# Patient Record
Sex: Female | Born: 1965
Health system: Southern US, Community
[De-identification: ages and names within clinical notes are randomized; demographics above are authoritative.]

## PROBLEM LIST (undated history)

## (undated) DIAGNOSIS — I639 Cerebral infarction, unspecified: Secondary | ICD-10-CM

## (undated) DIAGNOSIS — F32A Depression, unspecified: Secondary | ICD-10-CM

## (undated) DIAGNOSIS — F319 Bipolar disorder, unspecified: Secondary | ICD-10-CM

## (undated) DIAGNOSIS — J449 Chronic obstructive pulmonary disease, unspecified: Secondary | ICD-10-CM

## (undated) DIAGNOSIS — F329 Major depressive disorder, single episode, unspecified: Secondary | ICD-10-CM

## (undated) DIAGNOSIS — J45909 Unspecified asthma, uncomplicated: Secondary | ICD-10-CM

## (undated) HISTORY — PX: APPENDECTOMY: SHX54

## (undated) HISTORY — PX: CHOLECYSTECTOMY: SHX55

## (undated) HISTORY — PX: ABDOMINAL HYSTERECTOMY: SHX81

## (undated) HISTORY — PX: LUNG LOBECTOMY: SHX167

---

## 2006-12-18 DIAGNOSIS — F4481 Dissociative identity disorder: Secondary | ICD-10-CM | POA: Insufficient documentation

## 2010-09-13 DIAGNOSIS — F319 Bipolar disorder, unspecified: Secondary | ICD-10-CM | POA: Insufficient documentation

## 2010-09-13 DIAGNOSIS — B182 Chronic viral hepatitis C: Secondary | ICD-10-CM | POA: Insufficient documentation

## 2012-02-19 DIAGNOSIS — M25551 Pain in right hip: Secondary | ICD-10-CM | POA: Insufficient documentation

## 2013-05-19 ENCOUNTER — Encounter (HOSPITAL_COMMUNITY): Payer: Self-pay | Admitting: *Deleted

## 2013-05-19 ENCOUNTER — Emergency Department (HOSPITAL_COMMUNITY)
Admission: EM | Admit: 2013-05-19 | Discharge: 2013-05-19 | Discharge status: Against Medical Advice | Payer: Medicare Other | Attending: Emergency Medicine | Admitting: Emergency Medicine

## 2013-05-19 ENCOUNTER — Emergency Department (HOSPITAL_COMMUNITY): Payer: Medicare Other

## 2013-05-19 DIAGNOSIS — R0602 Shortness of breath: Secondary | ICD-10-CM | POA: Insufficient documentation

## 2013-05-19 DIAGNOSIS — Z5320 Procedure and treatment not carried out because of patient's decision for unspecified reasons: Secondary | ICD-10-CM

## 2013-05-19 DIAGNOSIS — Z9119 Patient's noncompliance with other medical treatment and regimen: Secondary | ICD-10-CM

## 2013-05-19 DIAGNOSIS — R509 Fever, unspecified: Secondary | ICD-10-CM | POA: Insufficient documentation

## 2013-05-19 HISTORY — DX: Depression, unspecified: F32.A

## 2013-05-19 HISTORY — DX: Bipolar disorder, unspecified: F31.9

## 2013-05-19 HISTORY — DX: Major depressive disorder, single episode, unspecified: F32.9

## 2013-05-19 HISTORY — DX: Cerebral infarction, unspecified: I63.9

## 2013-05-19 HISTORY — DX: Unspecified asthma, uncomplicated: J45.909

## 2013-05-19 HISTORY — DX: Chronic obstructive pulmonary disease, unspecified: J44.9

## 2013-05-19 NOTE — ED Provider Notes (Signed)
Pt left ED prior to being seen by physician.   1. Left before treatment completed      Shanna Cisco, MD 05/19/13 2242

## 2013-05-19 NOTE — ED Notes (Signed)
Called for pt for second time with no response

## 2013-05-19 NOTE — ED Notes (Signed)
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

## 2013-05-19 NOTE — ED Notes (Signed)
Called for pt, pt not in waiting room.

## 2013-05-19 NOTE — ED Notes (Signed)
Pt not in waiting room

## 2013-09-08 DIAGNOSIS — M25572 Pain in left ankle and joints of left foot: Secondary | ICD-10-CM | POA: Insufficient documentation

## 2014-12-27 ENCOUNTER — Other Ambulatory Visit (HOSPITAL_COMMUNITY): Payer: Self-pay | Admitting: Physician Assistant

## 2014-12-27 DIAGNOSIS — Z1231 Encounter for screening mammogram for malignant neoplasm of breast: Secondary | ICD-10-CM

## 2015-01-05 ENCOUNTER — Ambulatory Visit (HOSPITAL_COMMUNITY)
Admission: RE | Admit: 2015-01-05 | Discharge: 2015-01-05 | Disposition: A | Discharge status: Home / Self Care | Payer: Medicare Other | Source: Ambulatory Visit | Attending: Physician Assistant | Admitting: Physician Assistant

## 2015-01-05 ENCOUNTER — Ambulatory Visit (HOSPITAL_COMMUNITY): Payer: Medicare Other

## 2015-01-05 DIAGNOSIS — Z1231 Encounter for screening mammogram for malignant neoplasm of breast: Secondary | ICD-10-CM

## 2015-06-08 ENCOUNTER — Other Ambulatory Visit (HOSPITAL_COMMUNITY): Payer: Self-pay | Admitting: Respiratory Therapy

## 2015-06-08 DIAGNOSIS — J441 Chronic obstructive pulmonary disease with (acute) exacerbation: Secondary | ICD-10-CM

## 2015-06-10 ENCOUNTER — Encounter (HOSPITAL_COMMUNITY): Payer: Medicare Other

## 2015-06-17 ENCOUNTER — Ambulatory Visit (HOSPITAL_COMMUNITY)
Admission: RE | Admit: 2015-06-17 | Discharge: 2015-06-17 | Disposition: A | Discharge status: Home / Self Care | Payer: Medicare Other | Source: Ambulatory Visit | Attending: Pulmonary Disease | Admitting: Pulmonary Disease

## 2015-06-17 DIAGNOSIS — R05 Cough: Secondary | ICD-10-CM | POA: Diagnosis not present

## 2015-06-17 DIAGNOSIS — F17210 Nicotine dependence, cigarettes, uncomplicated: Secondary | ICD-10-CM | POA: Diagnosis not present

## 2015-06-17 DIAGNOSIS — R0600 Dyspnea, unspecified: Secondary | ICD-10-CM | POA: Insufficient documentation

## 2015-06-17 MED ORDER — ALBUTEROL SULFATE (2.5 MG/3ML) 0.083% IN NEBU
2.5000 mg | INHALATION_SOLUTION | Freq: Once | RESPIRATORY_TRACT | Status: AC
  Administered 2015-06-17: 2.5 mg via RESPIRATORY_TRACT

## 2015-08-05 LAB — PULMONARY FUNCTION TEST
DL/VA % PRED: 63 %
DL/VA: 3.35 ml/min/mmHg/L
DLCO UNC % PRED: 39 %
DLCO UNC: 11.76 ml/min/mmHg
FEF 25-75 POST: 1.27 L/s
FEF 25-75 PRE: 1.05 L/s
FEF2575-%CHANGE-POST: 20 %
FEF2575-%PRED-POST: 40 %
FEF2575-%Pred-Pre: 33 %
FEV1-%CHANGE-POST: 5 %
FEV1-%Pred-Post: 56 %
FEV1-%Pred-Pre: 53 %
FEV1-POST: 1.85 L
FEV1-Pre: 1.75 L
FEV1FVC-%CHANGE-POST: 2 %
FEV1FVC-%PRED-PRE: 83 %
FEV6-%CHANGE-POST: 3 %
FEV6-%Pred-Post: 66 %
FEV6-%Pred-Pre: 64 %
FEV6-PRE: 2.58 L
FEV6-Post: 2.66 L
FEV6FVC-%CHANGE-POST: 0 %
FEV6FVC-%PRED-PRE: 103 %
FEV6FVC-%Pred-Post: 103 %
FVC-%CHANGE-POST: 3 %
FVC-%PRED-POST: 65 %
FVC-%Pred-Pre: 63 %
FVC-Post: 2.66 L
FVC-Pre: 2.58 L
POST FEV1/FVC RATIO: 69 %
Post FEV6/FVC ratio: 100 %
Pre FEV1/FVC ratio: 68 %
Pre FEV6/FVC Ratio: 100 %
RV % PRED: 93 %
RV: 1.82 L
TLC % pred: 76 %
TLC: 4.33 L

## 2015-11-02 ENCOUNTER — Other Ambulatory Visit (HOSPITAL_COMMUNITY): Payer: Self-pay | Admitting: Pulmonary Disease

## 2015-11-02 DIAGNOSIS — Z780 Asymptomatic menopausal state: Secondary | ICD-10-CM

## 2015-11-09 ENCOUNTER — Ambulatory Visit (HOSPITAL_COMMUNITY)
Admission: RE | Admit: 2015-11-09 | Discharge: 2015-11-09 | Disposition: A | Discharge status: Home / Self Care | Payer: Medicare Other | Source: Ambulatory Visit | Attending: Pulmonary Disease | Admitting: Pulmonary Disease

## 2015-11-09 DIAGNOSIS — Z780 Asymptomatic menopausal state: Secondary | ICD-10-CM | POA: Diagnosis not present

## 2015-12-05 ENCOUNTER — Other Ambulatory Visit (HOSPITAL_COMMUNITY): Payer: Self-pay | Admitting: Physician Assistant

## 2015-12-05 DIAGNOSIS — Z1231 Encounter for screening mammogram for malignant neoplasm of breast: Secondary | ICD-10-CM

## 2015-12-12 ENCOUNTER — Ambulatory Visit (HOSPITAL_COMMUNITY): Payer: Medicare Other

## 2015-12-26 ENCOUNTER — Inpatient Hospital Stay (HOSPITAL_COMMUNITY)
Admission: AD | Admit: 2015-12-26 | Discharge: 2015-12-28 | DRG: 190 | Disposition: A | Discharge status: Home / Self Care | Payer: Medicare Other | Source: Ambulatory Visit | Attending: Pulmonary Disease | Admitting: Pulmonary Disease

## 2015-12-26 ENCOUNTER — Encounter (HOSPITAL_COMMUNITY): Payer: Self-pay

## 2015-12-26 ENCOUNTER — Inpatient Hospital Stay (HOSPITAL_COMMUNITY): Payer: Medicare Other

## 2015-12-26 DIAGNOSIS — I10 Essential (primary) hypertension: Secondary | ICD-10-CM | POA: Diagnosis present

## 2015-12-26 DIAGNOSIS — F3162 Bipolar disorder, current episode mixed, moderate: Secondary | ICD-10-CM | POA: Diagnosis present

## 2015-12-26 DIAGNOSIS — F17200 Nicotine dependence, unspecified, uncomplicated: Secondary | ICD-10-CM | POA: Diagnosis present

## 2015-12-26 DIAGNOSIS — E782 Mixed hyperlipidemia: Secondary | ICD-10-CM | POA: Diagnosis present

## 2015-12-26 DIAGNOSIS — J45909 Unspecified asthma, uncomplicated: Secondary | ICD-10-CM | POA: Diagnosis present

## 2015-12-26 DIAGNOSIS — Z902 Acquired absence of lung [part of]: Secondary | ICD-10-CM

## 2015-12-26 DIAGNOSIS — Z8673 Personal history of transient ischemic attack (TIA), and cerebral infarction without residual deficits: Secondary | ICD-10-CM

## 2015-12-26 DIAGNOSIS — J9621 Acute and chronic respiratory failure with hypoxia: Secondary | ICD-10-CM | POA: Diagnosis present

## 2015-12-26 DIAGNOSIS — J441 Chronic obstructive pulmonary disease with (acute) exacerbation: Secondary | ICD-10-CM | POA: Diagnosis present

## 2015-12-26 DIAGNOSIS — I6930 Unspecified sequelae of cerebral infarction: Secondary | ICD-10-CM

## 2015-12-26 LAB — COMPREHENSIVE METABOLIC PANEL
ALK PHOS: 105 U/L (ref 38–126)
ALT: 33 U/L (ref 14–54)
AST: 38 U/L (ref 15–41)
Albumin: 3.8 g/dL (ref 3.5–5.0)
Anion gap: 6 (ref 5–15)
BILIRUBIN TOTAL: 0.5 mg/dL (ref 0.3–1.2)
BUN: 7 mg/dL (ref 6–20)
CALCIUM: 9 mg/dL (ref 8.9–10.3)
CO2: 28 mmol/L (ref 22–32)
CREATININE: 0.71 mg/dL (ref 0.44–1.00)
Chloride: 106 mmol/L (ref 101–111)
GFR calc Af Amer: >60 mL/min (ref >60)
Glucose, Bld: 158 mg/dL — ABNORMAL HIGH (ref 65–99)
Potassium: 4.4 mmol/L (ref 3.5–5.1)
Sodium: 140 mmol/L (ref 135–145)
TOTAL PROTEIN: 7.2 g/dL (ref 6.5–8.1)

## 2015-12-26 LAB — CBC WITH DIFFERENTIAL/PLATELET
BASOS ABS: 0 10*3/uL (ref 0.0–0.1)
Basophils Relative: 0 %
EOS ABS: 0.1 10*3/uL (ref 0.0–0.7)
EOS PCT: 1 %
HCT: 49.2 % — ABNORMAL HIGH (ref 36.0–46.0)
Hemoglobin: 16.6 g/dL — ABNORMAL HIGH (ref 12.0–15.0)
LYMPHS ABS: 2.5 10*3/uL (ref 0.7–4.0)
Lymphocytes Relative: 22 %
MCH: 33 pg (ref 26.0–34.0)
MCHC: 33.7 g/dL (ref 30.0–36.0)
MCV: 97.8 fL (ref 78.0–100.0)
Monocytes Absolute: 0.5 10*3/uL (ref 0.1–1.0)
Monocytes Relative: 5 %
Neutro Abs: 8.3 10*3/uL — ABNORMAL HIGH (ref 1.7–7.7)
Neutrophils Relative %: 72 %
PLATELETS: 299 10*3/uL (ref 150–400)
RBC: 5.03 MIL/uL (ref 3.87–5.11)
RDW: 13.9 % (ref 11.5–15.5)
WBC: 11.4 10*3/uL — AB (ref 4.0–10.5)

## 2015-12-26 MED ORDER — ONDANSETRON HCL 4 MG/2ML IJ SOLN: 4.0000 mg | Freq: Four times a day (QID) | INTRAMUSCULAR | Status: DC | PRN

## 2015-12-26 MED ORDER — PERPHENAZINE 8 MG PO TABS
24.0000 mg | ORAL_TABLET | Freq: Every day | ORAL | Status: DC
  Administered 2015-12-26 – 2015-12-27 (×2): 24 mg via ORAL
  Filled 2015-12-26 (×4): qty 1

## 2015-12-26 MED ORDER — SODIUM CHLORIDE 0.9 % IV SOLN
INTRAVENOUS | Status: DC
  Administered 2015-12-27: 22:00:00 via INTRAVENOUS

## 2015-12-26 MED ORDER — ACETAMINOPHEN 650 MG RE SUPP: 650.0000 mg | Freq: Four times a day (QID) | RECTAL | Status: DC | PRN

## 2015-12-26 MED ORDER — ENOXAPARIN SODIUM 40 MG/0.4ML ~~LOC~~ SOLN
40.0000 mg | SUBCUTANEOUS | Status: DC
  Administered 2015-12-26 – 2015-12-27 (×2): 40 mg via SUBCUTANEOUS
  Filled 2015-12-26 (×2): qty 0.4

## 2015-12-26 MED ORDER — METHYLPREDNISOLONE SODIUM SUCC 40 MG IJ SOLR
40.0000 mg | Freq: Two times a day (BID) | INTRAMUSCULAR | Status: DC
  Administered 2015-12-27 – 2015-12-28 (×3): 40 mg via INTRAVENOUS
  Filled 2015-12-26 (×3): qty 1

## 2015-12-26 MED ORDER — ACETAMINOPHEN 325 MG PO TABS
650.0000 mg | ORAL_TABLET | Freq: Four times a day (QID) | ORAL | Status: DC | PRN
  Administered 2015-12-28: 650 mg via ORAL
  Filled 2015-12-26: qty 2

## 2015-12-26 MED ORDER — ONDANSETRON HCL 4 MG PO TABS: 4.0000 mg | ORAL_TABLET | Freq: Four times a day (QID) | ORAL | Status: DC | PRN

## 2015-12-26 MED ORDER — IPRATROPIUM-ALBUTEROL 0.5-2.5 (3) MG/3ML IN SOLN
3.0000 mL | RESPIRATORY_TRACT | Status: DC
  Administered 2015-12-26 (×2): 3 mL via RESPIRATORY_TRACT
  Filled 2015-12-26 (×2): qty 3

## 2015-12-27 LAB — BASIC METABOLIC PANEL
Anion gap: 8 (ref 5–15)
BUN: 6 mg/dL (ref 6–20)
CHLORIDE: 109 mmol/L (ref 101–111)
CO2: 27 mmol/L (ref 22–32)
Calcium: 9 mg/dL (ref 8.9–10.3)
Creatinine, Ser: 0.6 mg/dL (ref 0.44–1.00)
GFR calc Af Amer: >60 mL/min (ref >60)
GFR calc non Af Amer: >60 mL/min (ref >60)
GLUCOSE: 86 mg/dL (ref 65–99)
POTASSIUM: 3.8 mmol/L (ref 3.5–5.1)
Sodium: 144 mmol/L (ref 135–145)

## 2015-12-27 MED ORDER — ATENOLOL 25 MG PO TABS
50.0000 mg | ORAL_TABLET | Freq: Every day | ORAL | Status: DC
  Administered 2015-12-27 – 2015-12-28 (×2): 50 mg via ORAL
  Filled 2015-12-27 (×2): qty 2

## 2015-12-27 MED ORDER — MONTELUKAST SODIUM 10 MG PO TABS
10.0000 mg | ORAL_TABLET | Freq: Every evening | ORAL | Status: DC
  Administered 2015-12-27: 10 mg via ORAL
  Filled 2015-12-27: qty 1

## 2015-12-27 MED ORDER — PERPHENAZINE 8 MG PO TABS: 24.0000 mg | ORAL_TABLET | Freq: Every day | ORAL | Status: DC

## 2015-12-27 MED ORDER — IPRATROPIUM-ALBUTEROL 0.5-2.5 (3) MG/3ML IN SOLN
3.0000 mL | Freq: Four times a day (QID) | RESPIRATORY_TRACT | Status: DC
  Administered 2015-12-27 – 2015-12-28 (×5): 3 mL via RESPIRATORY_TRACT
  Filled 2015-12-27 (×5): qty 3

## 2015-12-27 MED ORDER — MOMETASONE FURO-FORMOTEROL FUM 200-5 MCG/ACT IN AERO
2.0000 | INHALATION_SPRAY | Freq: Two times a day (BID) | RESPIRATORY_TRACT | Status: DC
  Administered 2015-12-27 – 2015-12-28 (×2): 2 via RESPIRATORY_TRACT
  Filled 2015-12-27: qty 8.8

## 2015-12-27 MED ORDER — ATORVASTATIN CALCIUM 20 MG PO TABS
20.0000 mg | ORAL_TABLET | Freq: Every day | ORAL | Status: DC
  Administered 2015-12-27: 20 mg via ORAL
  Filled 2015-12-27: qty 1

## 2015-12-27 MED ORDER — MIRTAZAPINE 30 MG PO TABS
45.0000 mg | ORAL_TABLET | Freq: Every day | ORAL | Status: DC
  Administered 2015-12-27: 45 mg via ORAL
  Filled 2015-12-27: qty 1

## 2015-12-27 MED ORDER — ASENAPINE MALEATE 10 MG SL SUBL: 20.0000 mg | SUBLINGUAL_TABLET | Freq: Every day | SUBLINGUAL | Status: DC

## 2015-12-27 MED ORDER — LAMOTRIGINE 100 MG PO TABS
300.0000 mg | ORAL_TABLET | Freq: Every day | ORAL | Status: DC
  Administered 2015-12-27: 300 mg via ORAL
  Filled 2015-12-27: qty 3

## 2015-12-27 MED ORDER — ALPRAZOLAM 1 MG PO TABS
1.0000 mg | ORAL_TABLET | Freq: Four times a day (QID) | ORAL | Status: DC
  Administered 2015-12-27 – 2015-12-28 (×5): 1 mg via ORAL
  Filled 2015-12-27 (×5): qty 1

## 2015-12-27 MED ORDER — GABAPENTIN 300 MG PO CAPS
900.0000 mg | ORAL_CAPSULE | Freq: Every day | ORAL | Status: DC
  Administered 2015-12-27: 900 mg via ORAL
  Filled 2015-12-27: qty 3

## 2015-12-27 NOTE — Progress Notes (Signed)
Subjective: She says she feels a little better. She still coughing she still congested she's coughing up a little bit of sputum  Objective: Vital signs in last 24 hours: Temp:  [98.1 F (36.7 C)-98.5 F (36.9 C)] 98.2 F (36.8 C) (03/21 0500) Pulse Rate:  [68-75] 75 (03/21 0500) Resp:  [20] 20 (03/21 0500) BP: (89-114)/(56-92) 114/92 mmHg (03/21 0500) SpO2:  [92 %-95 %] 92 % (03/21 0747) Weight:  [87.544 kg (193 lb)-87.8 kg (193 lb 9 oz)] 87.544 kg (193 lb) (03/21 0500) Weight change:  Last BM Date: 12/26/15  Intake/Output from previous day: 03/20 0701 - 03/21 0700 In: 240 [P.O.:240] Out: -   PHYSICAL EXAM General appearance: alert, cooperative and mild distress Resp: rhonchi bilaterally Cardio: regular rate and rhythm, S1, S2 normal, no murmur, click, rub or gallop GI: soft, non-tender; bowel sounds normal; no masses,  no organomegaly Extremities: extremities normal, atraumatic, no cyanosis or edema  Lab Results:  Results for orders placed or performed during the hospital encounter of 12/26/15 (from the past 48 hour(s))  Comprehensive metabolic panel     Status: Abnormal   Collection Time: 12/26/15  6:44 PM  Result Value Ref Range   Sodium 140 135 - 145 mmol/L   Potassium 4.4 3.5 - 5.1 mmol/L   Chloride 106 101 - 111 mmol/L   CO2 28 22 - 32 mmol/L   Glucose, Bld 158 (H) 65 - 99 mg/dL   BUN 7 6 - 20 mg/dL   Creatinine, Ser 0.71 0.44 - 1.00 mg/dL   Calcium 9.0 8.9 - 10.3 mg/dL   Total Protein 7.2 6.5 - 8.1 g/dL   Albumin 3.8 3.5 - 5.0 g/dL   AST 38 15 - 41 U/L   ALT 33 14 - 54 U/L   Alkaline Phosphatase 105 38 - 126 U/L   Total Bilirubin 0.5 0.3 - 1.2 mg/dL   GFR calc non Af Amer >60 >60 mL/min   GFR calc Af Amer >60 >60 mL/min    Comment: (NOTE) The eGFR has been calculated using the CKD EPI equation. This calculation has not been validated in all clinical situations. eGFR's persistently <60 mL/min signify possible Chronic Kidney Disease.    Anion gap 6 5 - 15   CBC WITH DIFFERENTIAL     Status: Abnormal   Collection Time: 12/26/15  6:44 PM  Result Value Ref Range   WBC 11.4 (H) 4.0 - 10.5 K/uL   RBC 5.03 3.87 - 5.11 MIL/uL   Hemoglobin 16.6 (H) 12.0 - 15.0 g/dL   HCT 49.2 (H) 36.0 - 46.0 %   MCV 97.8 78.0 - 100.0 fL   MCH 33.0 26.0 - 34.0 pg   MCHC 33.7 30.0 - 36.0 g/dL   RDW 13.9 11.5 - 15.5 %   Platelets 299 150 - 400 K/uL   Neutrophils Relative % 72 %   Neutro Abs 8.3 (H) 1.7 - 7.7 K/uL   Lymphocytes Relative 22 %   Lymphs Abs 2.5 0.7 - 4.0 K/uL   Monocytes Relative 5 %   Monocytes Absolute 0.5 0.1 - 1.0 K/uL   Eosinophils Relative 1 %   Eosinophils Absolute 0.1 0.0 - 0.7 K/uL   Basophils Relative 0 %   Basophils Absolute 0.0 0.0 - 0.1 K/uL  Basic metabolic panel     Status: None   Collection Time: 12/27/15  5:10 AM  Result Value Ref Range   Sodium 144 135 - 145 mmol/L   Potassium 3.8 3.5 - 5.1 mmol/L  Chloride 109 101 - 111 mmol/L   CO2 27 22 - 32 mmol/L   Glucose, Bld 86 65 - 99 mg/dL   BUN 6 6 - 20 mg/dL   Creatinine, Ser 0.60 0.44 - 1.00 mg/dL   Calcium 9.0 8.9 - 10.3 mg/dL   GFR calc non Af Amer >60 >60 mL/min   GFR calc Af Amer >60 >60 mL/min    Comment: (NOTE) The eGFR has been calculated using the CKD EPI equation. This calculation has not been validated in all clinical situations. eGFR's persistently <60 mL/min signify possible Chronic Kidney Disease.    Anion gap 8 5 - 15    ABGS No results for input(s): PHART, PO2ART, TCO2, HCO3 in the last 72 hours.  Invalid input(s): PCO2 CULTURES No results found for this or any previous visit (from the past 240 hour(s)). Studies/Results: X-ray Chest Pa And Lateral  12/26/2015  CLINICAL DATA:  Productive cough for 3 months. On antibiotics for upper respiratory infection 3 months ago current smoker with history of asthma/ COPD. EXAM: CHEST  2 VIEW COMPARISON:  05/19/2013. FINDINGS: The heart size and mediastinal contours are stable. There are postsurgical changes at the  right apex with associated pleural thickening. Mild scarring is present at both lung bases, similar to the prior examination. No superimposed edema, airspace disease or significant pleural effusion. The bones appear unchanged. IMPRESSION: Stable postoperative chest with right apical and mild bibasilar scarring. No acute cardiopulmonary process. Electronically Signed   By: Richardean Sale M.D.   On: 12/26/2015 18:46    Medications:  Prior to Admission:  Prescriptions prior to admission  Medication Sig Dispense Refill Last Dose  . ALPRAZolam (XANAX) 1 MG tablet Take 1 mg by mouth 4 (four) times daily.  3 12/26/2015 at Unknown time  . atenolol (TENORMIN) 50 MG tablet Take 50 mg by mouth daily.  2 12/26/2015 at 800  . atorvastatin (LIPITOR) 20 MG tablet Take 20 mg by mouth at bedtime.  1 12/25/2015 at Unknown time  . gabapentin (NEURONTIN) 300 MG capsule Take 900 mg by mouth at bedtime.  2 12/25/2015 at Unknown time  . lamoTRIgine (LAMICTAL) 150 MG tablet Take 300 mg by mouth at bedtime.  3 12/25/2015 at Unknown time  . mirtazapine (REMERON) 45 MG tablet Take 45 mg by mouth at bedtime.  3 12/25/2015 at Unknown time  . montelukast (SINGULAIR) 10 MG tablet Take 10 mg by mouth every evening.  2 12/25/2015 at Unknown time  . perphenazine (TRILAFON) 8 MG tablet Take 24 mg by mouth at bedtime.  3 12/25/2015 at Unknown time  . predniSONE (DELTASONE) 10 MG tablet Take 5 mg by mouth daily.  5 12/26/2015 at Unknown time  . SAPHRIS 10 MG SUBL Place 20 mg under the tongue at bedtime.  3 12/25/2015 at Unknown time  . SYMBICORT 160-4.5 MCG/ACT inhaler Inhale 2 puffs into the lungs 2 (two) times daily.  2 12/26/2015 at Unknown time  . PROAIR HFA 108 (90 Base) MCG/ACT inhaler Inhale 2 puffs into the lungs every 4 (four) hours as needed. Shortness of breath  2 unknown   Scheduled: . ALPRAZolam  1 mg Oral QID  . Asenapine Maleate  20 mg Sublingual QHS  . atenolol  50 mg Oral Daily  . atorvastatin  20 mg Oral QHS  .  enoxaparin (LOVENOX) injection  40 mg Subcutaneous Q24H  . gabapentin  900 mg Oral QHS  . ipratropium-albuterol  3 mL Nebulization Q6H  . lamoTRIgine  300 mg  Oral QHS  . methylPREDNISolone (SOLU-MEDROL) injection  40 mg Intravenous Q12H  . mirtazapine  45 mg Oral QHS  . mometasone-formoterol  2 puff Inhalation BID  . montelukast  10 mg Oral QPM  . perphenazine  24 mg Oral QHS   Continuous: . sodium chloride     NUU:[id number] **OR** acetaminophen, ondansetron **OR** ondansetron (ZOFRAN) IV  Assesment: She has COPD exacerbation and acute on chronic respiratory failure with hypoxia. She's better but not clear. She's not ready for discharge yet. Active Problems:   COPD exacerbation ([place])    Plan: Continue IV antibiotics and IV steroids and inhaled bronchodilators    LOS: 1 day   Dameian Crisman L 12/27/2015, 8:55 AM

## 2015-12-27 NOTE — H&P (Signed)
THEDORA RINGS MRN: [id number] DOB/AGE: 49-Aug-1967 49 y.o. Primary Care Physician:White, Raenette Rover, FNP Admit date: 12/26/2015 Chief Complaint: Shortness of breath cough HPI: This is documentation of history and physical done in my office yesterday prior to her admission. This is a 50 year old who has severe COPD with significant asthmatic component. She's been having cough and congestion and increased shortness of breath for about 6 weeks. She's tried multiple antibiotics and steroids as an outpatient had laboratory work and a chest x-ray that did not show any specific cause. Because of her unresponsiveness to outpatient treatment she is being admitted. She is very short of breath. She has chronic hypoxic respiratory failure on oxygen. She has had a right upper lobectomy in the past. She also has history of stroke and of bipolar disease.  Past Medical History  Diagnosis Date  . Asthma   . COPD (chronic obstructive pulmonary disease) (HCC)   . Bipolar disorder (HCC)   . Depression   . Stroke [facility])    Past Surgical History  Procedure Laterality Date  . Cesarean section    . Cholecystectomy    . Appendectomy    . Abdominal hysterectomy    . Lung lobectomy      right lung         History reviewed. No pertinent family history.  Social History:  reports that she has been smoking.  She does not have any smokeless tobacco history on file. She reports that she does not drink alcohol or use illicit drugs.   Allergies:  Allergies  Allergen Reactions  . Other Anaphylaxis    Allergic to melons  . Baclofen     hallucinations  . Flonase  [Fluticasone Propionate] Other (See Comments)    Nose swelling and nose bleeds  . Gadopentetate Dimeglumine Other (See Comments)    "purple" discoloration, slight numbness to left fifth finger, "purple dots" on the top of left hand following MRI exam 07/22/13 with Magnevist contrast.  Radiologist did not feel this was related to contrast.  . Lortab  [Hydrocodone-Acetaminophen]     N/v hives  . Seroquel [Quetiapine Fumarate]     n/v  . Sudafed [Pseudoephedrine Hcl]     blisters    Medications Prior to Admission  Medication Sig Dispense Refill  . ALPRAZolam (XANAX) 1 MG tablet Take 1 mg by mouth 4 (four) times daily.  3  . atenolol (TENORMIN) 50 MG tablet Take 50 mg by mouth daily.  2  . atorvastatin (LIPITOR) 20 MG tablet Take 20 mg by mouth at bedtime.  1  . gabapentin (NEURONTIN) 300 MG capsule Take 900 mg by mouth at bedtime.  2  . lamoTRIgine (LAMICTAL) 150 MG tablet Take 300 mg by mouth at bedtime.  3  . mirtazapine (REMERON) 45 MG tablet Take 45 mg by mouth at bedtime.  3  . montelukast (SINGULAIR) 10 MG tablet Take 10 mg by mouth every evening.  2  . perphenazine (TRILAFON) 8 MG tablet Take 24 mg by mouth at bedtime.  3  . predniSONE (DELTASONE) 10 MG tablet Take 5 mg by mouth daily.  5  . SAPHRIS 10 MG SUBL Place 20 mg under the tongue at bedtime.  3  . SYMBICORT 160-4.5 MCG/ACT inhaler Inhale 2 puffs into the lungs 2 (two) times daily.  2  . PROAIR HFA 108 (90 Base) MCG/ACT inhaler Inhale 2 puffs into the lungs every 4 (four) hours as needed. Shortness of breath  2       WUJ:WJXBJ  from the symptoms mentioned above,there are no other symptoms referable to all systems reviewed.  Physical Exam: Blood pressure 114/92, pulse 75, temperature 98.2 F (36.8 C), temperature source Oral, resp. rate 20, height 5\' 7"  (1.702 m), weight 87.544 kg (193 lb), SpO2 92 %. She is awake and alert. She is edentulous. She is in mild mild to moderate respiratory distress and coughing during the examination. Her pupils are reactive nose and throat are clear. Her neck is supple without masses. Chest shows significant bilateral wheezes. Her heart is regular without gallop. Her abdomen is soft without masses. Extremities showed no edema. Central nervous system exam is grossly intact    Recent Labs  12/26/15 1844  WBC 11.4*  NEUTROABS 8.3*   HGB 16.6*  HCT 49.2*  MCV 97.8  PLT 299    Recent Labs  12/26/15 1844 12/27/15 0510  NA 140 144  K 4.4 3.8  CL 106 109  CO2 28 27  GLUCOSE 158* 86  BUN 7 6  CREATININE 0.71 0.60  CALCIUM 9.0 9.0  lablast2(ast:2,ALT:2,alkphos:2,bilitot:2,prot:2,albumin:2)@    No results found for this or any previous visit (from the past 240 hour(s)).   X-ray Chest Pa And Lateral  12/26/2015  CLINICAL DATA:  Productive cough for 3 months. On antibiotics for upper respiratory infection 3 months ago current smoker with history of asthma/ COPD. EXAM: CHEST  2 VIEW COMPARISON:  05/19/2013. FINDINGS: The heart size and mediastinal contours are stable. There are postsurgical changes at the right apex with associated pleural thickening. Mild scarring is present at both lung bases, similar to the prior examination. No superimposed edema, airspace disease or significant pleural effusion. The bones appear unchanged. IMPRESSION: Stable postoperative chest with right apical and mild bibasilar scarring. No acute cardiopulmonary process. Electronically Signed   By: Carey BullocksWilliam  Veazey M.D.   On: 12/26/2015 18:46   Impression: She has COPD exacerbation. She has a history of right upper lobectomy. She has bipolar disease and history of a stroke. She has chronic hypoxic respiratory failure Active Problems:   COPD exacerbation (HCC)     Plan: Admit for IV antibiotics and steroids and nebulizer treatments.      Daryana Whirley L   12/27/2015, 8:53 AM

## 2015-12-28 ENCOUNTER — Ambulatory Visit (HOSPITAL_COMMUNITY): Payer: Medicare Other

## 2015-12-28 DIAGNOSIS — J9621 Acute and chronic respiratory failure with hypoxia: Secondary | ICD-10-CM | POA: Diagnosis present

## 2015-12-28 DIAGNOSIS — I6930 Unspecified sequelae of cerebral infarction: Secondary | ICD-10-CM

## 2015-12-28 DIAGNOSIS — E782 Mixed hyperlipidemia: Secondary | ICD-10-CM | POA: Diagnosis present

## 2015-12-28 DIAGNOSIS — I10 Essential (primary) hypertension: Secondary | ICD-10-CM | POA: Diagnosis present

## 2015-12-28 DIAGNOSIS — F3162 Bipolar disorder, current episode mixed, moderate: Secondary | ICD-10-CM | POA: Diagnosis present

## 2015-12-28 MED ORDER — PREDNISONE 10 MG PO TABS: 40.0000 mg | ORAL_TABLET | Freq: Every day | ORAL | Status: DC

## 2015-12-28 MED ORDER — PREDNISONE 20 MG PO TABS
40.0000 mg | ORAL_TABLET | Freq: Every day | ORAL | Status: DC
  Administered 2015-12-28: 40 mg via ORAL
  Filled 2015-12-28: qty 2

## 2015-12-28 MED ORDER — LEVOFLOXACIN 500 MG PO TABS: 500.0000 mg | ORAL_TABLET | Freq: Every day | ORAL | Status: DC

## 2015-12-28 MED ORDER — LEVOFLOXACIN 500 MG PO TABS
500.0000 mg | ORAL_TABLET | Freq: Every day | ORAL | Status: DC
  Administered 2015-12-28: 500 mg via ORAL
  Filled 2015-12-28: qty 1

## 2015-12-28 NOTE — Progress Notes (Signed)
Pt IV removed, tolerated well.  Reviewed discharge instructions with pt and answered questions at this time.   

## 2015-12-28 NOTE — Plan of Care (Signed)
Report received from night nurse. Assumed care of pt. Alicia Dixon, [place].

## 2015-12-28 NOTE — Care Management Note (Signed)
Case Management Note  Patient Details  Name: Alicia Dixon MRN: [id number] Date of Birth: [birth year]  Subjective/Objective:                  Pt admitted with COPD. Pt is from home and is ind with ADL's. Pt has no HH services or DME needs prior to admission.   Action/Plan: Pt discharging home today with self care. No CM needs.   Expected Discharge Date:       12/28/2015           Expected Discharge Plan:  Home/Self Care  In-House Referral:  NA  Discharge planning Services  CM Consult  Post Acute Care Choice:  NA Choice offered to:  NA  DME Arranged:    DME Agency:     HH Arranged:    HH Agency:     Status of Service:  Completed, signed off  Medicare Important Message Given:    Date Medicare IM Given:    Medicare IM give by:    Date Additional Medicare IM Given:    Additional Medicare Important Message give by:     If discussed at Long Length of Stay Meetings, dates discussed:    Additional Comments:  Malcolm MetroChildress, Alicia Friar Demske, RN 12/28/2015, 11:14 AM

## 2015-12-28 NOTE — Discharge Summary (Signed)
Physician Discharge Summary  Patient ID: TEARRA OUK MRN: [id number] DOB/AGE: Feb 28, 1966 50 y.o. Primary Care Physician:White, Raenette Rover, FNP Admit date: 12/26/2015 Discharge date: 12/28/2015    Discharge Diagnoses:   Active Problems:   COPD exacerbation (HCC)   Acute on chronic respiratory failure with hypoxia (HCC)   Bipolar 1 disorder, mixed, moderate (HCC)   Personal history of stroke with current residual effects   Essential hypertension   Hyperlipidemia, mixed     Medication List    TAKE these medications        ALPRAZolam 1 MG tablet  Commonly known as:  XANAX  Take 1 mg by mouth 4 (four) times daily.     atenolol 50 MG tablet  Commonly known as:  TENORMIN  Take 50 mg by mouth daily.     atorvastatin 20 MG tablet  Commonly known as:  LIPITOR  Take 20 mg by mouth at bedtime.     gabapentin 300 MG capsule  Commonly known as:  NEURONTIN  Take 900 mg by mouth at bedtime.     lamoTRIgine 150 MG tablet  Commonly known as:  LAMICTAL  Take 300 mg by mouth at bedtime.     levofloxacin 500 MG tablet  Commonly known as:  LEVAQUIN  Take 1 tablet (500 mg total) by mouth daily.     mirtazapine 45 MG tablet  Commonly known as:  REMERON  Take 45 mg by mouth at bedtime.     montelukast 10 MG tablet  Commonly known as:  SINGULAIR  Take 10 mg by mouth every evening.     perphenazine 8 MG tablet  Commonly known as:  TRILAFON  Take 24 mg by mouth at bedtime.     predniSONE 10 MG tablet  Commonly known as:  DELTASONE  Take 5 mg by mouth daily.     predniSONE 10 MG tablet  Commonly known as:  DELTASONE  Take 4 tablets (40 mg total) by mouth daily with breakfast. 4 daily for 3 days, 3 for 3 days, 2 for 3 days, 1 for 3 days then 5 mg daily     PROAIR HFA 108 (90 Base) MCG/ACT inhaler  Generic drug:  albuterol  Inhale 2 puffs into the lungs every 4 (four) hours as needed. Shortness of breath     SAPHRIS 10 MG Subl  Generic drug:  Asenapine Maleate  Place 20  mg under the tongue at bedtime.     SYMBICORT 160-4.5 MCG/ACT inhaler  Generic drug:  budesonide-formoterol  Inhale 2 puffs into the lungs 2 (two) times daily.        Discharged Condition: This is a 50 year old who has been sick for several weeks. She has been on steroids and antibiotics with not much help. She came to my office on the day of admission was found to have some respiratory distress and was admitted from my office. She was started on inhaled bronchodilators and IV steroids and antibiotics and improved rapidly. At the time of discharge she said she felt the best she had been in months.    Consults:none  Significant Diagnostic Studies: X-ray Chest Pa And Lateral  12/26/2015  CLINICAL DATA:  Productive cough for 3 months. On antibiotics for upper respiratory infection 3 months ago current smoker with history of asthma/ COPD. EXAM: CHEST  2 VIEW COMPARISON:  05/19/2013. FINDINGS: The heart size and mediastinal contours are stable. There are postsurgical changes at the right apex with associated pleural thickening. Mild scarring is present at both  lung bases, similar to the prior examination. No superimposed edema, airspace disease or significant pleural effusion. The bones appear unchanged. IMPRESSION: Stable postoperative chest with right apical and mild bibasilar scarring. No acute cardiopulmonary process. Electronically Signed   By: Carey BullocksWilliam  Veazey M.D.   On: 12/26/2015 18:46    Lab Results: Basic Metabolic Panel:  Recent Labs  09/81/1903/20/17 1844 12/27/15 0510  NA 140 144  K 4.4 3.8  CL 106 109  CO2 28 27  GLUCOSE 158* 86  BUN 7 6  CREATININE 0.71 0.60  CALCIUM 9.0 9.0   Liver Function Tests:  Recent Labs  12/26/15 1844  AST 38  ALT 33  ALKPHOS 105  BILITOT 0.5  PROT 7.2  ALBUMIN 3.8     CBC:  Recent Labs  12/26/15 1844  WBC 11.4*  NEUTROABS 8.3*  HGB 16.6*  HCT 49.2*  MCV 97.8  PLT 299    No results found for this or any previous visit (from the  past 240 hour(s)).   Hospital Course: She was admitted from my office as above. Her exam shows that she was wheezing and clearly short of breath. She was wearing oxygen. She was treated with IV steroids given antibiotics and inhaled bronchodilators and continued on oxygen. She had marked improvement rapidly. By the time of discharge her shortness of breath had resolved and she felt the best she felt in months.  Discharge Exam: Blood pressure 115/80, pulse 78, temperature 97.9 F (36.6 C), temperature source Oral, resp. rate 20, height 5\' 7"  (1.702 m), weight 87.7 kg (193 lb 5.5 oz), SpO2 91 %. She is awake and alert. Her chest shows some rhonchi and decreased breath sounds which are chronic  Disposition: Home we discussed home health services but she does not want that. In addition to the medications listed on her meds above she does have a nebulizer and has been taking DuoNeb 4 times a day. She will follow-up with her primary care physician's office and in my office.      Signed: Sugar Vanzandt L   12/28/2015, 8:34 AM

## 2015-12-28 NOTE — Progress Notes (Signed)
Subjective: She says she feels great and wants to go home. She has no new complaints. Her breathing is significantly improved  Objective: Vital signs in last 24 hours: Temp:  [97.7 F (36.5 C)-97.9 F (36.6 C)] 97.9 F (36.6 C) (03/22 0425) Pulse Rate:  [78-90] 78 (03/22 0425) Resp:  [20] 20 (03/22 0425) BP: (101-122)/(57-80) 115/80 mmHg (03/22 0425) SpO2:  [89 %-97 %] 91 % (03/22 0753) Weight:  [87.7 kg (193 lb 5.5 oz)] 87.7 kg (193 lb 5.5 oz) (03/22 0425) Weight change: -0.1 kg (-3.5 oz) Last BM Date: 12/26/15  Intake/Output from previous day: 03/21 0701 - 03/22 0700 In: 1320 [P.O.:720; I.V.:600] Out: 2700 [Urine:2700]  PHYSICAL EXAM General appearance: alert, cooperative and no distress Resp: rhonchi bilaterally Cardio: regular rate and rhythm, S1, S2 normal, no murmur, click, rub or gallop GI: soft, non-tender; bowel sounds normal; no masses,  no organomegaly Extremities: extremities normal, atraumatic, no cyanosis or edema  Lab Results:  Results for orders placed or performed during the hospital encounter of 12/26/15 (from the past 48 hour(s))  Comprehensive metabolic panel     Status: Abnormal   Collection Time: 12/26/15  6:44 PM  Result Value Ref Range   Sodium 140 135 - 145 mmol/L   Potassium 4.4 3.5 - 5.1 mmol/L   Chloride 106 101 - 111 mmol/L   CO2 28 22 - 32 mmol/L   Glucose, Bld 158 (H) 65 - 99 mg/dL   BUN 7 6 - 20 mg/dL   Creatinine, Ser 0.71 0.44 - 1.00 mg/dL   Calcium 9.0 8.9 - 10.3 mg/dL   Total Protein 7.2 6.5 - 8.1 g/dL   Albumin 3.8 3.5 - 5.0 g/dL   AST 38 15 - 41 U/L   ALT 33 14 - 54 U/L   Alkaline Phosphatase 105 38 - 126 U/L   Total Bilirubin 0.5 0.3 - 1.2 mg/dL   GFR calc non Af Amer >60 >60 mL/min   GFR calc Af Amer >60 >60 mL/min    Comment: (NOTE) The eGFR has been calculated using the CKD EPI equation. This calculation has not been validated in all clinical situations. eGFR's persistently <60 mL/min signify possible Chronic  Kidney Disease.    Anion gap 6 5 - 15  CBC WITH DIFFERENTIAL     Status: Abnormal   Collection Time: 12/26/15  6:44 PM  Result Value Ref Range   WBC 11.4 (H) 4.0 - 10.5 K/uL   RBC 5.03 3.87 - 5.11 MIL/uL   Hemoglobin 16.6 (H) 12.0 - 15.0 g/dL   HCT 49.2 (H) 36.0 - 46.0 %   MCV 97.8 78.0 - 100.0 fL   MCH 33.0 26.0 - 34.0 pg   MCHC 33.7 30.0 - 36.0 g/dL   RDW 13.9 11.5 - 15.5 %   Platelets 299 150 - 400 K/uL   Neutrophils Relative % 72 %   Neutro Abs 8.3 (H) 1.7 - 7.7 K/uL   Lymphocytes Relative 22 %   Lymphs Abs 2.5 0.7 - 4.0 K/uL   Monocytes Relative 5 %   Monocytes Absolute 0.5 0.1 - 1.0 K/uL   Eosinophils Relative 1 %   Eosinophils Absolute 0.1 0.0 - 0.7 K/uL   Basophils Relative 0 %   Basophils Absolute 0.0 0.0 - 0.1 K/uL  Basic metabolic panel     Status: None   Collection Time: 12/27/15  5:10 AM  Result Value Ref Range   Sodium 144 135 - 145 mmol/L   Potassium 3.8 3.5 - 5.1 mmol/L  Chloride 109 101 - 111 mmol/L   CO2 27 22 - 32 mmol/L   Glucose, Bld 86 65 - 99 mg/dL   BUN 6 6 - 20 mg/dL   Creatinine, Ser 0.60 0.44 - 1.00 mg/dL   Calcium 9.0 8.9 - 10.3 mg/dL   GFR calc non Af Amer >60 >60 mL/min   GFR calc Af Amer >60 >60 mL/min    Comment: (NOTE) The eGFR has been calculated using the CKD EPI equation. This calculation has not been validated in all clinical situations. eGFR's persistently <60 mL/min signify possible Chronic Kidney Disease.    Anion gap 8 5 - 15    ABGS No results for input(s): PHART, PO2ART, TCO2, HCO3 in the last 72 hours.  Invalid input(s): PCO2 CULTURES No results found for this or any previous visit (from the past 240 hour(s)). Studies/Results: X-ray Chest Pa And Lateral  12/26/2015  CLINICAL DATA:  Productive cough for 3 months. On antibiotics for upper respiratory infection 3 months ago current smoker with history of asthma/ COPD. EXAM: CHEST  2 VIEW COMPARISON:  05/19/2013. FINDINGS: The heart size and mediastinal contours are  stable. There are postsurgical changes at the right apex with associated pleural thickening. Mild scarring is present at both lung bases, similar to the prior examination. No superimposed edema, airspace disease or significant pleural effusion. The bones appear unchanged. IMPRESSION: Stable postoperative chest with right apical and mild bibasilar scarring. No acute cardiopulmonary process. Electronically Signed   By: Richardean Sale M.D.   On: 12/26/2015 18:46    Medications:  Prior to Admission:  Prescriptions prior to admission  Medication Sig Dispense Refill Last Dose  . ALPRAZolam (XANAX) 1 MG tablet Take 1 mg by mouth 4 (four) times daily.  3 12/26/2015 at Unknown time  . atenolol (TENORMIN) 50 MG tablet Take 50 mg by mouth daily.  2 12/26/2015 at 800  . atorvastatin (LIPITOR) 20 MG tablet Take 20 mg by mouth at bedtime.  1 12/25/2015 at Unknown time  . gabapentin (NEURONTIN) 300 MG capsule Take 900 mg by mouth at bedtime.  2 12/25/2015 at Unknown time  . lamoTRIgine (LAMICTAL) 150 MG tablet Take 300 mg by mouth at bedtime.  3 12/25/2015 at Unknown time  . mirtazapine (REMERON) 45 MG tablet Take 45 mg by mouth at bedtime.  3 12/25/2015 at Unknown time  . montelukast (SINGULAIR) 10 MG tablet Take 10 mg by mouth every evening.  2 12/25/2015 at Unknown time  . perphenazine (TRILAFON) 8 MG tablet Take 24 mg by mouth at bedtime.  3 12/25/2015 at Unknown time  . predniSONE (DELTASONE) 10 MG tablet Take 5 mg by mouth daily.  5 12/26/2015 at Unknown time  . SAPHRIS 10 MG SUBL Place 20 mg under the tongue at bedtime.  3 12/25/2015 at Unknown time  . SYMBICORT 160-4.5 MCG/ACT inhaler Inhale 2 puffs into the lungs 2 (two) times daily.  2 12/26/2015 at Unknown time  . PROAIR HFA 108 (90 Base) MCG/ACT inhaler Inhale 2 puffs into the lungs every 4 (four) hours as needed. Shortness of breath  2 unknown   Scheduled: . ALPRAZolam  1 mg Oral QID  . Asenapine Maleate  20 mg Sublingual QHS  . atenolol  50 mg Oral Daily   . atorvastatin  20 mg Oral QHS  . enoxaparin (LOVENOX) injection  40 mg Subcutaneous Q24H  . gabapentin  900 mg Oral QHS  . ipratropium-albuterol  3 mL Nebulization Q6H  . lamoTRIgine  300 mg  Oral QHS  . levofloxacin  500 mg Oral Daily  . mirtazapine  45 mg Oral QHS  . mometasone-formoterol  2 puff Inhalation BID  . montelukast  10 mg Oral QPM  . perphenazine  24 mg Oral QHS  . predniSONE  40 mg Oral Q breakfast   Continuous: . sodium chloride 50 mL/hr at 12/27/15 2220   FBP:ZWCHENIDPOEUM **OR** acetaminophen, ondansetron **OR** ondansetron (ZOFRAN) IV  Assesment: She was admitted with COPD exacerbation and acute on chronic hypoxic respiratory failure. She is much better.  At baseline she has hypertension which has been well controlled, hyperlipidemia followed by her primary care physician and bipolar 1 disorder. She has a history of a stroke in the past and has mild continued issues from that Active Problems:   COPD exacerbation (HCC)   Acute on chronic respiratory failure with hypoxia (HCC)   Bipolar 1 disorder, mixed, moderate (HCC)   Personal history of stroke with current residual effects   Essential hypertension   Hyperlipidemia, mixed    Plan: Discharge home    LOS: 2 days   Alicia Dixon L 12/28/2015, 8:32 AM

## 2016-01-11 ENCOUNTER — Ambulatory Visit (HOSPITAL_COMMUNITY)
Admission: RE | Admit: 2016-01-11 | Discharge: 2016-01-11 | Disposition: A | Discharge status: Home / Self Care | Payer: Medicare Other | Source: Ambulatory Visit | Attending: Physician Assistant | Admitting: Physician Assistant

## 2016-01-11 DIAGNOSIS — Z1231 Encounter for screening mammogram for malignant neoplasm of breast: Secondary | ICD-10-CM | POA: Diagnosis not present

## 2016-04-16 ENCOUNTER — Encounter (HOSPITAL_COMMUNITY): Payer: Self-pay

## 2016-04-16 ENCOUNTER — Observation Stay (HOSPITAL_COMMUNITY): Payer: Medicare Other

## 2016-04-16 ENCOUNTER — Inpatient Hospital Stay (HOSPITAL_COMMUNITY)
Admission: AD | Admit: 2016-04-16 | Discharge: 2016-04-18 | DRG: 190 | Disposition: A | Discharge status: Home / Self Care | Payer: Medicare Other | Source: Ambulatory Visit | Attending: Pulmonary Disease | Admitting: Pulmonary Disease

## 2016-04-16 DIAGNOSIS — Z8673 Personal history of transient ischemic attack (TIA), and cerebral infarction without residual deficits: Secondary | ICD-10-CM

## 2016-04-16 DIAGNOSIS — J441 Chronic obstructive pulmonary disease with (acute) exacerbation: Principal | ICD-10-CM | POA: Diagnosis present

## 2016-04-16 DIAGNOSIS — F3162 Bipolar disorder, current episode mixed, moderate: Secondary | ICD-10-CM | POA: Diagnosis present

## 2016-04-16 DIAGNOSIS — Z87891 Personal history of nicotine dependence: Secondary | ICD-10-CM

## 2016-04-16 DIAGNOSIS — R0602 Shortness of breath: Secondary | ICD-10-CM | POA: Diagnosis present

## 2016-04-16 DIAGNOSIS — J45909 Unspecified asthma, uncomplicated: Secondary | ICD-10-CM | POA: Diagnosis present

## 2016-04-16 DIAGNOSIS — Z79891 Long term (current) use of opiate analgesic: Secondary | ICD-10-CM

## 2016-04-16 DIAGNOSIS — J9621 Acute and chronic respiratory failure with hypoxia: Secondary | ICD-10-CM | POA: Diagnosis present

## 2016-04-16 DIAGNOSIS — Z7951 Long term (current) use of inhaled steroids: Secondary | ICD-10-CM

## 2016-04-16 DIAGNOSIS — I10 Essential (primary) hypertension: Secondary | ICD-10-CM | POA: Diagnosis present

## 2016-04-16 LAB — CBC WITH DIFFERENTIAL/PLATELET
BASOS ABS: 0 10*3/uL (ref 0.0–0.1)
Basophils Relative: 0 %
EOS PCT: 1 %
Eosinophils Absolute: 0.1 10*3/uL (ref 0.0–0.7)
HCT: 48.1 % — ABNORMAL HIGH (ref 36.0–46.0)
Hemoglobin: 16.1 g/dL — ABNORMAL HIGH (ref 12.0–15.0)
LYMPHS PCT: 21 %
Lymphs Abs: 2.4 10*3/uL (ref 0.7–4.0)
MCH: 32.1 pg (ref 26.0–34.0)
MCHC: 33.5 g/dL (ref 30.0–36.0)
MCV: 95.8 fL (ref 78.0–100.0)
Monocytes Absolute: 0.6 10*3/uL (ref 0.1–1.0)
Monocytes Relative: 5 %
NEUTROS ABS: 8.1 10*3/uL — AB (ref 1.7–7.7)
Neutrophils Relative %: 73 %
PLATELETS: 298 10*3/uL (ref 150–400)
RBC: 5.02 MIL/uL (ref 3.87–5.11)
RDW: 13.4 % (ref 11.5–15.5)
WBC: 11.1 10*3/uL — AB (ref 4.0–10.5)

## 2016-04-16 LAB — COMPREHENSIVE METABOLIC PANEL
ALT: 25 U/L (ref 14–54)
ANION GAP: 6 (ref 5–15)
AST: 25 U/L (ref 15–41)
Albumin: 3.5 g/dL (ref 3.5–5.0)
Alkaline Phosphatase: 108 U/L (ref 38–126)
BILIRUBIN TOTAL: 0.4 mg/dL (ref 0.3–1.2)
BUN: 7 mg/dL (ref 6–20)
CO2: 26 mmol/L (ref 22–32)
Calcium: 8.8 mg/dL — ABNORMAL LOW (ref 8.9–10.3)
Chloride: 107 mmol/L (ref 101–111)
Creatinine, Ser: 0.64 mg/dL (ref 0.44–1.00)
GFR calc Af Amer: >60 mL/min (ref >60)
Glucose, Bld: 144 mg/dL — ABNORMAL HIGH (ref 65–99)
POTASSIUM: 3.6 mmol/L (ref 3.5–5.1)
Sodium: 139 mmol/L (ref 135–145)
TOTAL PROTEIN: 6.7 g/dL (ref 6.5–8.1)

## 2016-04-16 LAB — BRAIN NATRIURETIC PEPTIDE: B Natriuretic Peptide: 42 pg/mL (ref 0.0–100.0)

## 2016-04-16 MED ORDER — LEVOFLOXACIN IN D5W 500 MG/100ML IV SOLN
500.0000 mg | INTRAVENOUS | Status: DC
  Administered 2016-04-16 – 2016-04-17 (×2): 500 mg via INTRAVENOUS
  Filled 2016-04-16 (×2): qty 100

## 2016-04-16 MED ORDER — ONDANSETRON HCL 4 MG/2ML IJ SOLN: 4.0000 mg | Freq: Four times a day (QID) | INTRAMUSCULAR | Status: DC | PRN

## 2016-04-16 MED ORDER — IOPAMIDOL (ISOVUE-370) INJECTION 76%
100.0000 mL | Freq: Once | INTRAVENOUS | Status: AC | PRN
  Administered 2016-04-16: 100 mL via INTRAVENOUS

## 2016-04-16 MED ORDER — ONDANSETRON HCL 4 MG PO TABS: 4.0000 mg | ORAL_TABLET | Freq: Four times a day (QID) | ORAL | Status: DC | PRN

## 2016-04-16 MED ORDER — METHYLPREDNISOLONE SODIUM SUCC 125 MG IJ SOLR
125.0000 mg | Freq: Four times a day (QID) | INTRAMUSCULAR | Status: DC
  Administered 2016-04-16 – 2016-04-17 (×3): 125 mg via INTRAVENOUS
  Filled 2016-04-16 (×3): qty 2

## 2016-04-16 MED ORDER — ACETAMINOPHEN 325 MG PO TABS: 650.0000 mg | ORAL_TABLET | Freq: Four times a day (QID) | ORAL | Status: DC | PRN

## 2016-04-16 MED ORDER — SODIUM CHLORIDE 0.9 % IV SOLN
INTRAVENOUS | Status: DC
  Administered 2016-04-16 – 2016-04-17 (×2): via INTRAVENOUS

## 2016-04-16 MED ORDER — ACETAMINOPHEN 650 MG RE SUPP: 650.0000 mg | Freq: Four times a day (QID) | RECTAL | Status: DC | PRN

## 2016-04-16 MED ORDER — ENOXAPARIN SODIUM 40 MG/0.4ML ~~LOC~~ SOLN
40.0000 mg | SUBCUTANEOUS | Status: DC
  Administered 2016-04-16 – 2016-04-17 (×2): 40 mg via SUBCUTANEOUS
  Filled 2016-04-16 (×2): qty 0.4

## 2016-04-16 MED ORDER — SODIUM CHLORIDE 0.9% FLUSH
3.0000 mL | Freq: Two times a day (BID) | INTRAVENOUS | Status: DC
  Administered 2016-04-16 – 2016-04-17 (×4): 3 mL via INTRAVENOUS

## 2016-04-16 NOTE — H&P (Signed)
NAMVincent Peyer:  Dixon Dixon               ACCOUNT NO.:  [id number]  MEDICAL RECORD NO.:  [id number]  LOCATION:  A323                          FACILITY:  [facility]  PHYSICIAN:  Avyonna Wagoner L. Juanetta GoslingHawkins, M.D.DATE OF BIRTH:  1966/04/22  DATE OF ADMISSION:  04/16/2016 DATE OF DISCHARGE:  LH                             HISTORY & PHYSICAL   CHIEF COMPLAINT:  Shortness of breath.  HISTORY:  Ms. Dixon Dixon is a 50 year old with severe COPD and chronic hypoxic respiratory failure.  She was in her usual state of poor health when she was awakened this morning at sleep with increased shortness of breath.  She has tried nebulizer treatments, etc., and nothing has helped.  She called her primary care physician, who told her to come to my office.  She did come to my office and had what appeared to be COPD exacerbation but with some question of pulmonary emboli or other problems.  She was last hospitalized in March of this year with COPD exacerbation.  She has asthma/COPD, bipolar disease, previous history of stroke.  Surgically, she has had a C-section, cholecystectomy, appendectomy, abdominal hysterectomy, a lobectomy of her right lung.  SOCIAL HISTORY:  She has about a 60 pack-year smoking history.  She does not use any alcohol and no illicit drugs.  MEDICATIONS:  Her medications have been reviewed.  REVIEW OF SYSTEMS:  She has not had any hemoptysis.  She has had mild chest pain.  No nausea, vomiting, or diarrhea.  No abdominal pain.  No HEENT symptoms.  No leg swelling.  No recent travel.  PHYSICAL EXAMINATION:  GENERAL:  A well-developed, well-nourished female, who is in mild distress.  She is wearing oxygen. HEENT:  Pupils are reactive.  Nose and throat are clear.  Mucous membranes are moist. NECK:  Supple without masses. CHEST:  Decreased breath sounds and wheezing. HEART:  Regular without gallop. ABDOMEN:  Soft.  No masses felt. EXTREMITIES:  No edema. CENTRAL NERVOUS SYSTEM:  She appears to be anxious,  but otherwise normal.  ASSESSMENT:  She has abrupt onset of shortness of breath, but she does have a history of COPD.  I think we are going to have to rule out a pulmonary embolus and continue with her other treatments.  She is going to start on steroids and antibiotics.     Eirik Schueler L. Juanetta GoslingHawkins, M.D.     ELH/MEDQ  D:  04/16/2016  T:  04/16/2016  Job:  [id number]

## 2016-04-17 DIAGNOSIS — Z79891 Long term (current) use of opiate analgesic: Secondary | ICD-10-CM | POA: Diagnosis not present

## 2016-04-17 DIAGNOSIS — Z87891 Personal history of nicotine dependence: Secondary | ICD-10-CM | POA: Diagnosis not present

## 2016-04-17 DIAGNOSIS — F3162 Bipolar disorder, current episode mixed, moderate: Secondary | ICD-10-CM | POA: Diagnosis present

## 2016-04-17 DIAGNOSIS — I10 Essential (primary) hypertension: Secondary | ICD-10-CM | POA: Diagnosis present

## 2016-04-17 DIAGNOSIS — R0602 Shortness of breath: Secondary | ICD-10-CM | POA: Diagnosis present

## 2016-04-17 DIAGNOSIS — J9621 Acute and chronic respiratory failure with hypoxia: Secondary | ICD-10-CM | POA: Diagnosis present

## 2016-04-17 DIAGNOSIS — J45909 Unspecified asthma, uncomplicated: Secondary | ICD-10-CM | POA: Diagnosis present

## 2016-04-17 DIAGNOSIS — J441 Chronic obstructive pulmonary disease with (acute) exacerbation: Secondary | ICD-10-CM | POA: Diagnosis present

## 2016-04-17 DIAGNOSIS — Z8673 Personal history of transient ischemic attack (TIA), and cerebral infarction without residual deficits: Secondary | ICD-10-CM | POA: Diagnosis not present

## 2016-04-17 DIAGNOSIS — Z7951 Long term (current) use of inhaled steroids: Secondary | ICD-10-CM | POA: Diagnosis not present

## 2016-04-17 MED ORDER — PERPHENAZINE 8 MG PO TABS
24.0000 mg | ORAL_TABLET | Freq: Every day | ORAL | Status: DC
  Administered 2016-04-17: 24 mg via ORAL
  Filled 2016-04-17 (×3): qty 1

## 2016-04-17 MED ORDER — METHYLPREDNISOLONE SODIUM SUCC 40 MG IJ SOLR
40.0000 mg | Freq: Four times a day (QID) | INTRAMUSCULAR | Status: DC
Start: 2016-04-17 — End: 2016-04-18
  Administered 2016-04-17 – 2016-04-18 (×3): 40 mg via INTRAVENOUS
  Filled 2016-04-17 (×3): qty 1

## 2016-04-17 MED ORDER — MUPIROCIN 2 % EX OINT
TOPICAL_OINTMENT | Freq: Two times a day (BID) | CUTANEOUS | Status: DC
  Administered 2016-04-17: 21:00:00 via TOPICAL
  Filled 2016-04-17: qty 22

## 2016-04-17 MED ORDER — ASENAPINE MALEATE 5 MG SL SUBL
20.0000 mg | SUBLINGUAL_TABLET | Freq: Every day | SUBLINGUAL | Status: DC
  Filled 2016-04-17 (×3): qty 4

## 2016-04-17 MED ORDER — ALBUTEROL SULFATE (2.5 MG/3ML) 0.083% IN NEBU
2.5000 mg | INHALATION_SOLUTION | RESPIRATORY_TRACT | Status: DC | PRN
  Administered 2016-04-18: 2.5 mg via RESPIRATORY_TRACT
  Filled 2016-04-17: qty 3

## 2016-04-17 MED ORDER — ATORVASTATIN CALCIUM 20 MG PO TABS
20.0000 mg | ORAL_TABLET | Freq: Every day | ORAL | Status: DC
  Administered 2016-04-17: 20 mg via ORAL
  Filled 2016-04-17: qty 1

## 2016-04-17 MED ORDER — IBUPROFEN 600 MG PO TABS: 600.0000 mg | ORAL_TABLET | Freq: Four times a day (QID) | ORAL | Status: DC | PRN

## 2016-04-17 MED ORDER — MONTELUKAST SODIUM 10 MG PO TABS: 10.0000 mg | ORAL_TABLET | Freq: Every evening | ORAL | Status: DC

## 2016-04-17 MED ORDER — OXYCODONE-ACETAMINOPHEN 5-325 MG PO TABS: 1.0000 | ORAL_TABLET | Freq: Four times a day (QID) | ORAL | Status: DC | PRN

## 2016-04-17 MED ORDER — MIRTAZAPINE 30 MG PO TABS
45.0000 mg | ORAL_TABLET | Freq: Every day | ORAL | Status: DC
  Administered 2016-04-17: 45 mg via ORAL
  Filled 2016-04-17: qty 1

## 2016-04-17 MED ORDER — IPRATROPIUM-ALBUTEROL 0.5-2.5 (3) MG/3ML IN SOLN
3.0000 mL | Freq: Four times a day (QID) | RESPIRATORY_TRACT | Status: DC
  Administered 2016-04-18: 3 mL via RESPIRATORY_TRACT
  Filled 2016-04-17: qty 3

## 2016-04-17 MED ORDER — ZOLPIDEM TARTRATE 5 MG PO TABS: 5.0000 mg | ORAL_TABLET | Freq: Every evening | ORAL | Status: DC | PRN

## 2016-04-17 MED ORDER — GABAPENTIN 300 MG PO CAPS
900.0000 mg | ORAL_CAPSULE | Freq: Every day | ORAL | Status: DC
  Administered 2016-04-17: 900 mg via ORAL
  Filled 2016-04-17: qty 3

## 2016-04-17 MED ORDER — ALPRAZOLAM 1 MG PO TABS
1.0000 mg | ORAL_TABLET | Freq: Four times a day (QID) | ORAL | Status: DC
  Administered 2016-04-17: 1 mg via ORAL
  Filled 2016-04-17 (×2): qty 1

## 2016-04-17 MED ORDER — LAMOTRIGINE 100 MG PO TABS
300.0000 mg | ORAL_TABLET | Freq: Every day | ORAL | Status: DC
Start: 2016-04-17 — End: 2016-04-18
  Administered 2016-04-17: 300 mg via ORAL
  Filled 2016-04-17: qty 3

## 2016-04-17 MED ORDER — IPRATROPIUM-ALBUTEROL 0.5-2.5 (3) MG/3ML IN SOLN
3.0000 mL | RESPIRATORY_TRACT | Status: DC
  Administered 2016-04-17 (×4): 3 mL via RESPIRATORY_TRACT
  Filled 2016-04-17 (×5): qty 3

## 2016-04-17 MED ORDER — ATENOLOL 25 MG PO TABS
25.0000 mg | ORAL_TABLET | Freq: Every day | ORAL | Status: DC
  Administered 2016-04-17: 25 mg via ORAL
  Filled 2016-04-17: qty 1

## 2016-04-17 NOTE — Progress Notes (Signed)
Wash clothes and towels given per pt request

## 2016-04-17 NOTE — Progress Notes (Signed)
Pt did not want her xanax at thist ime

## 2016-04-17 NOTE — Progress Notes (Signed)
Subjective: She feels better. CT did not show  Objective: Vital signs in last 24 hours: Temp:  [97.8 F (36.6 C)-98.5 F (36.9 C)] 98.2 F (36.8 C) (07/11 0530) Pulse Rate:  [73-93] 93 (07/11 0530) Resp:  [20-22] 20 (07/11 0530) BP: (102-109)/(65-76) 109/65 mmHg (07/11 0530) SpO2:  [91 %-93 %] 91 % (07/11 0836) Weight:  [93.94 kg (207 lb 1.6 oz)] 93.94 kg (207 lb 1.6 oz) (07/11 0530) Weight change:  Last BM Date: 04/15/16  Intake/Output from previous day: 07/10 0701 - 07/11 0700 In: -  Out: 2000 [Urine:2000]  PHYSICAL EXAM General appearance: alert, cooperative and mild distress Resp: Clear with prolonged expiratory phase and diminished breath sounds bilaterally Cardio: regular rate and rhythm, S1, S2 normal, no murmur, click, rub or gallop GI: soft, non-tender; bowel sounds normal; no masses,  no organomegaly Extremities: extremities normal, atraumatic, no cyanosis or edema  Lab Results:  Results for orders placed or performed during the hospital encounter of 04/16/16 (from the past 48 hour(s))  Comprehensive metabolic panel     Status: Abnormal   Collection Time: 04/16/16  6:38 PM  Result Value Ref Range   Sodium 139 135 - 145 mmol/L   Potassium 3.6 3.5 - 5.1 mmol/L   Chloride 107 101 - 111 mmol/L   CO2 26 22 - 32 mmol/L   Glucose, Bld 144 (H) 65 - 99 mg/dL   BUN 7 6 - 20 mg/dL   Creatinine, Ser 0.64 0.44 - 1.00 mg/dL   Calcium 8.8 (L) 8.9 - 10.3 mg/dL   Total Protein 6.7 6.5 - 8.1 g/dL   Albumin 3.5 3.5 - 5.0 g/dL   AST 25 15 - 41 U/L   ALT 25 14 - 54 U/L   Alkaline Phosphatase 108 38 - 126 U/L   Total Bilirubin 0.4 0.3 - 1.2 mg/dL   GFR calc non Af Amer >60 >60 mL/min   GFR calc Af Amer >60 >60 mL/min    Comment: (NOTE) The eGFR has been calculated using the CKD EPI equation. This calculation has not been validated in all clinical situations. eGFR's persistently <60 mL/min signify possible Chronic Kidney Disease.    Anion gap 6 5 - 15  CBC WITH  DIFFERENTIAL     Status: Abnormal   Collection Time: 04/16/16  6:38 PM  Result Value Ref Range   WBC 11.1 (H) 4.0 - 10.5 K/uL   RBC 5.02 3.87 - 5.11 MIL/uL   Hemoglobin 16.1 (H) 12.0 - 15.0 g/dL   HCT 48.1 (H) 36.0 - 46.0 %   MCV 95.8 78.0 - 100.0 fL   MCH 32.1 26.0 - 34.0 pg   MCHC 33.5 30.0 - 36.0 g/dL   RDW 13.4 11.5 - 15.5 %   Platelets 298 150 - 400 K/uL   Neutrophils Relative % 73 %   Neutro Abs 8.1 (H) 1.7 - 7.7 K/uL   Lymphocytes Relative 21 %   Lymphs Abs 2.4 0.7 - 4.0 K/uL   Monocytes Relative 5 %   Monocytes Absolute 0.6 0.1 - 1.0 K/uL   Eosinophils Relative 1 %   Eosinophils Absolute 0.1 0.0 - 0.7 K/uL   Basophils Relative 0 %   Basophils Absolute 0.0 0.0 - 0.1 K/uL  Brain natriuretic peptide     Status: None   Collection Time: 04/16/16  6:38 PM  Result Value Ref Range   B Natriuretic Peptide 42.0 0.0 - 100.0 pg/mL    ABGS No results for input(s): PHART, PO2ART, TCO2, HCO3 in the  last 72 hours.  Invalid input(s): PCO2 CULTURES No results found for this or any previous visit (from the past 240 hour(s)). Studies/Results: Ct Angio Chest Pe W Or Wo Contrast  04/16/2016  CLINICAL DATA:  50 year old female with shortness of breath. EXAM: CT ANGIOGRAPHY CHEST WITH CONTRAST TECHNIQUE: Multidetector CT imaging of the chest was performed using the standard protocol during bolus administration of intravenous contrast. Multiplanar CT image reconstructions and MIPs were obtained to evaluate the vascular anatomy. CONTRAST:  100 cc Isovue 370 COMPARISON:  Chest radiograph dated 12/26/2015 FINDINGS: Evaluation of this exam is limited due to respiratory motion artifact. There is centrilobular emphysema with right apical scarring. Left lung base linear atelectasis/scarring noted. There is no focal consolidation. No pleural effusion or pneumothorax. There is a 1.7 x 3.8 cm high attenuating area in the right apical posterior pleural which is not well evaluated but likely related to  postsurgical material, scarring or pleurodesis. This appears to have been present on the chest radiographs dated 12/26/2015 and 05/19/2013. The central airways are patent. The thoracic aorta appears unremarkable. The origins of the great vessels of the aortic arch appear patent. Set evaluation of the pulmonary arteries is very limited due to respiratory motion artifact and not visually shin of the peripheral branches. No definite large central pulmonary artery embolus identified. V/Q scan may provide better evaluation if there is high clinical concern for PE. There is no cardiomegaly or pericardial effusion. No hilar or mediastinal adenopathy noted. The esophagus is grossly unremarkable. No thyroid nodules identified. There is no axillary adenopathy. The chest wall soft tissues appear unremarkable. The osseous structures are intact. Cholecystectomy. The visualized upper abdomen is otherwise unremarkable. Review of the MIP images confirms the above findings. IMPRESSION: Very limited evaluation of the pulmonary arteries due to respiratory motion artifact. No definite central pulmonary embolus identified. Emphysema with right apical postsurgical changes and scarring. High attenuating density along the right posterior apical pleural surface likely related to postsurgical material or pleurodesis. Electronically Signed   By: Anner Crete M.D.   On: 04/16/2016 21:33    Medications:  Prior to Admission:  Prescriptions prior to admission  Medication Sig Dispense Refill Last Dose  . ALPRAZolam (XANAX) 1 MG tablet Take 1 mg by mouth 4 (four) times daily.  3 12/26/2015 at Unknown time  . atenolol (TENORMIN) 25 MG tablet Take 25 mg by mouth daily.  1   . atorvastatin (LIPITOR) 20 MG tablet Take 20 mg by mouth at bedtime.  1 12/25/2015 at Unknown time  . gabapentin (NEURONTIN) 300 MG capsule Take 900 mg by mouth at bedtime.  2 12/25/2015 at Unknown time  . ibuprofen (ADVIL,MOTRIN) 600 MG tablet Take 1 tablet by mouth  every 6 (six) hours as needed. FOR PAIN  0   . ipratropium-albuterol (DUONEB) 0.5-2.5 (3) MG/3ML SOLN Take 3 mLs by nebulization 4 (four) times daily.     Marland Kitchen lamoTRIgine (LAMICTAL) 150 MG tablet Take 300 mg by mouth at bedtime.  3 12/25/2015 at Unknown time  . levofloxacin (LEVAQUIN) 500 MG tablet Take 1 tablet (500 mg total) by mouth daily. 10 tablet 0   . mirtazapine (REMERON) 45 MG tablet Take 45 mg by mouth at bedtime.  3 12/25/2015 at Unknown time  . montelukast (SINGULAIR) 10 MG tablet Take 10 mg by mouth every evening.  2 12/25/2015 at Unknown time  . oxyCODONE-acetaminophen (PERCOCET/ROXICET) 5-325 MG tablet Take 1 tablet by mouth every 6 (six) hours as needed. FOR PAIN  0   .  perphenazine (TRILAFON) 8 MG tablet Take 24 mg by mouth at bedtime.  3 12/25/2015 at Unknown time  . predniSONE (DELTASONE) 10 MG tablet Take 5 mg by mouth daily.  5 12/26/2015 at Unknown time  . predniSONE (DELTASONE) 10 MG tablet Take 4 tablets (40 mg total) by mouth daily with breakfast. 4 daily for 3 days, 3 for 3 days, 2 for 3 days, 1 for 3 days then 5 mg daily 30 tablet 0   . PROAIR HFA 108 (90 Base) MCG/ACT inhaler Inhale 2 puffs into the lungs every 4 (four) hours as needed. Shortness of breath  2 unknown  . SAPHRIS 10 MG SUBL Place 20 mg under the tongue at bedtime.  3 12/25/2015 at Unknown time  . SYMBICORT 160-4.5 MCG/ACT inhaler Inhale 2 puffs into the lungs 2 (two) times daily.  2 12/26/2015 at Unknown time  . [DISCONTINUED] atenolol (TENORMIN) 50 MG tablet Take 50 mg by mouth daily.  2 12/26/2015 at 800   Scheduled: . ALPRAZolam  1 mg Oral QID  . asenapine  20 mg Sublingual QHS  . atenolol  25 mg Oral Daily  . atorvastatin  20 mg Oral QHS  . enoxaparin (LOVENOX) injection  40 mg Subcutaneous Q24H  . gabapentin  900 mg Oral QHS  . ipratropium-albuterol  3 mL Nebulization Q4H  . lamoTRIgine  300 mg Oral QHS  . levofloxacin (LEVAQUIN) IV  500 mg Intravenous Q24H  . methylPREDNISolone (SOLU-MEDROL) injection   40 mg Intravenous Q6H  . mirtazapine  45 mg Oral QHS  . montelukast  10 mg Oral QPM  . perphenazine  24 mg Oral QHS  . sodium chloride flush  3 mL Intravenous Q12H   Continuous: . sodium chloride 50 mL/hr at 04/16/16 1905   PRN:acetaminophen **OR** acetaminophen, ibuprofen, ondansetron **OR** ondansetron (ZOFRAN) IV, oxyCODONE-acetaminophen  Assesment: She was admitted with shortness of breath. I was concerned when I saw her in my office she might have pulmonary emboli but she does not. No evidence of pneumonia. She has improved with IV steroids antibiotics etc. There was some problem with the orders from my office so I have reordered her maintenance medications from home Active Problems:   Shortness of breath    Plan: Continue treatments. I think she can probably go home tomorrow      , L 04/17/2016, 8:53 AM   

## 2016-04-17 NOTE — Progress Notes (Signed)
Introduced self to pt. No needs at this time. Call bell with in reach. Family at bedside.

## 2016-04-17 NOTE — Progress Notes (Signed)
Patient requesting breathing treatment.  Dr. Juanetta GoslingHawkins paged and notified.  Dr. Juanetta GoslingHawkins returned call and gave order for Duoneb treatment every 4 hours.  Patient has allergy to pseudoephedrine, and Dr. Juanetta GoslingHawkins notified.  Gave order to continue with order.

## 2016-04-17 NOTE — Care Management Obs Status (Signed)
MEDICARE OBSERVATION STATUS NOTIFICATION   Patient Details  Name: Alicia Dixon MRN: [id number] Date of Birth: 08/18/1966   Medicare Observation Status Notification Given:  Yes    Alicia Dixon, Alicia OilerSharley Diane, RN 04/17/2016, 12:07 PM

## 2016-04-17 NOTE — Care Management Note (Signed)
Case Management Note  Patient Details  Name: Alicia Dixon MRN: [id number] Date of Birth: May 19, 1966  Subjective/Objective:  Patient is from home with boyfriend. Ind with ADL's. She has home oxygen provided by [facility]. She has a PCP and reports no issues obtaining medications. Nursing had stated patient has had a few falls lately.  Patient states this is not related to weakness and she is "clumsy".                Action/Plan: Anticipate d/c home with self care. No CM needs.    Expected Discharge Date:                 04/18/2016 Expected Discharge Plan:  Home/Self Care  In-House Referral:  NA  Discharge planning Services  CM Consult  Post Acute Care Choice:  NA Choice offered to:  NA  DME Arranged:    DME Agency:     HH Arranged:    HH Agency:     Status of Service:  Completed, signed off  If discussed at [employer] Length of Stay Meetings, dates discussed:    Additional Comments:  Amirra Herling, Chrystine OilerSharley Diane, RN 04/17/2016, 12:04 PM

## 2016-04-18 MED ORDER — LEVOFLOXACIN 500 MG PO TABS: 500.0000 mg | ORAL_TABLET | Freq: Every day | ORAL | Status: DC

## 2016-04-18 MED ORDER — PREDNISONE 10 MG PO TABS: 40.0000 mg | ORAL_TABLET | Freq: Every day | ORAL | Status: DC

## 2016-04-18 NOTE — Care Management Important Message (Signed)
Important Message  Patient Details  Name: Alicia Dixon MRN: [id number] Date of Birth: 03-03-1966   Medicare Important Message Given:  Yes    Lauralei Clouse, Chrystine OilerSharley Diane, RN 04/18/2016, 11:19 AM

## 2016-04-18 NOTE — Discharge Summary (Signed)
Physician Discharge Summary  Patient ID: Alicia Dixon MRN: [id number] DOB/AGE: 03-06-1966 50 y.o. Primary Care Physician:White, Raenette Rover, FNP Admit date: 04/16/2016 Discharge date: 04/18/2016    Discharge Diagnoses:   Active Problems:   COPD exacerbation (HCC)   Acute on chronic respiratory failure with hypoxia (HCC)   Bipolar 1 disorder, mixed, moderate (HCC)   Essential hypertension   Shortness of breath     Medication List    TAKE these medications        ALPRAZolam 1 MG tablet  Commonly known as:  XANAX  Take 1 mg by mouth 4 (four) times daily.     atenolol 25 MG tablet  Commonly known as:  TENORMIN  Take 25 mg by mouth daily.     atorvastatin 20 MG tablet  Commonly known as:  LIPITOR  Take 20 mg by mouth at bedtime.     cetirizine 10 MG tablet  Commonly known as:  ZYRTEC  Take 10 mg by mouth daily.     gabapentin 300 MG capsule  Commonly known as:  NEURONTIN  Take 900 mg by mouth at bedtime.     ibuprofen 600 MG tablet  Commonly known as:  ADVIL,MOTRIN  Take 1 tablet by mouth every 6 (six) hours as needed. FOR PAIN     ipratropium-albuterol 0.5-2.5 (3) MG/3ML Soln  Commonly known as:  DUONEB  Take 3 mLs by nebulization every 6 (six) hours as needed (shortness of breath).     lamoTRIgine 150 MG tablet  Commonly known as:  LAMICTAL  Take 300 mg by mouth at bedtime.     levofloxacin 500 MG tablet  Commonly known as:  LEVAQUIN  Take 1 tablet (500 mg total) by mouth daily.     mirtazapine 45 MG tablet  Commonly known as:  REMERON  Take 45 mg by mouth at bedtime.     montelukast 10 MG tablet  Commonly known as:  SINGULAIR  Take 10 mg by mouth every evening.     oxyCODONE-acetaminophen 5-325 MG tablet  Commonly known as:  PERCOCET/ROXICET  Take 1 tablet by mouth every 6 (six) hours as needed. FOR PAIN     perphenazine 8 MG tablet  Commonly known as:  TRILAFON  Take 24 mg by mouth at bedtime.     predniSONE 10 MG tablet  Commonly known as:   DELTASONE  Take 5 mg by mouth daily.     predniSONE 10 MG tablet  Commonly known as:  DELTASONE  Take 4 tablets (40 mg total) by mouth daily with breakfast. 4 daily for 3 days, 3 for 3 days, 2 for 3 days, 1 for 3 days then 5 mg daily     PROAIR HFA 108 (90 Base) MCG/ACT inhaler  Generic drug:  albuterol  Inhale 2 puffs into the lungs every 4 (four) hours as needed. Shortness of breath     SAPHRIS 10 MG Subl  Generic drug:  Asenapine Maleate  Place 20 mg under the tongue every evening.     SYMBICORT 160-4.5 MCG/ACT inhaler  Generic drug:  budesonide-formoterol  Inhale 2 puffs into the lungs 2 (two) times daily.        Discharged Condition:Improved    Consults: None  Significant Diagnostic Studies: Ct Angio Chest Pe W Or Wo Contrast  04/16/2016  CLINICAL DATA:  50 year old female with shortness of breath. EXAM: CT ANGIOGRAPHY CHEST WITH CONTRAST TECHNIQUE: Multidetector CT imaging of the chest was performed using the standard protocol during bolus administration of  intravenous contrast. Multiplanar CT image reconstructions and MIPs were obtained to evaluate the vascular anatomy. CONTRAST:  100 cc Isovue 370 COMPARISON:  Chest radiograph dated 12/26/2015 FINDINGS: Evaluation of this exam is limited due to respiratory motion artifact. There is centrilobular emphysema with right apical scarring. Left lung base linear atelectasis/scarring noted. There is no focal consolidation. No pleural effusion or pneumothorax. There is a 1.7 x 3.8 cm high attenuating area in the right apical posterior pleural which is not well evaluated but likely related to postsurgical material, scarring or pleurodesis. This appears to have been present on the chest radiographs dated 12/26/2015 and 05/19/2013. The central airways are patent. The thoracic aorta appears unremarkable. The origins of the great vessels of the aortic arch appear patent. Set evaluation of the pulmonary arteries is very limited due to  respiratory motion artifact and not visually shin of the peripheral branches. No definite large central pulmonary artery embolus identified. V/Q scan may provide better evaluation if there is high clinical concern for PE. There is no cardiomegaly or pericardial effusion. No hilar or mediastinal adenopathy noted. The esophagus is grossly unremarkable. No thyroid nodules identified. There is no axillary adenopathy. The chest wall soft tissues appear unremarkable. The osseous structures are intact. Cholecystectomy. The visualized upper abdomen is otherwise unremarkable. Review of the MIP images confirms the above findings. IMPRESSION: Very limited evaluation of the pulmonary arteries due to respiratory motion artifact. No definite central pulmonary embolus identified. Emphysema with right apical postsurgical changes and scarring. High attenuating density along the right posterior apical pleural surface likely related to postsurgical material or pleurodesis. Electronically Signed   By: Elgie CollardArash  Radparvar M.D.   On: 04/16/2016 21:33    Lab Results: Basic Metabolic Panel:  Recent Labs  91/47/8206/07/24 1838  NA 139  K 3.6  CL 107  CO2 26  GLUCOSE 144*  BUN 7  CREATININE 0.64  CALCIUM 8.8*   Liver Function Tests:  Recent Labs  04/16/16 1838  AST 25  ALT 25  ALKPHOS 108  BILITOT 0.4  PROT 6.7  ALBUMIN 3.5     CBC:  Recent Labs  04/16/16 1838  WBC 11.1*  NEUTROABS 8.1*  HGB 16.1*  HCT 48.1*  MCV 95.8  PLT 298    No results found for this or any previous visit (from the past 240 hour(s)).   Hospital Course: This is a 50 year old who came to my office with sudden onset of severe shortness of breath. I was concerned that she might have had pulmonary emboli. She was clearly too short of breath to go home and she was brought into the hospital. She was started on IV steroids antibiotics and inhaled bronchodilators. She had CT of the chest that did not show definite pulmonary emboli. She  improved over the next 48 hours and was ready for discharge. She was back at baseline and perhaps even a little better than her baseline at home  Discharge Exam: Blood pressure 104/63, pulse 103, temperature 97.9 F (36.6 C), temperature source Oral, resp. rate 20, weight 93.92 kg (207 lb 0.9 oz), SpO2 91 %. She is awake and alert. Her chest is clear with end-expiratory wheezes  Disposition: Home      Discharge Instructions    Discharge patient    Complete by:  As directed              Signed: Prescilla Monger L   04/18/2016, 8:59 AM

## 2016-04-18 NOTE — Progress Notes (Signed)
She feels much better. She says she is back at baseline and in fact feels a little better than she normally does at home. She wants to go home.  Her vital signs are as recorded. She looks comfortable. Her chest is clear with end-expiratory wheezing. Her heart is regular without gallop. She has no edema.  She was admitted with COPD exacerbation. She is markedly improved. There was concern that she might have had pulmonary emboli but she did not.  She is ready for discharge. Please see discharge summary for details

## 2016-04-18 NOTE — Progress Notes (Signed)
Pt discharged home with significant other. IV removed and site intact.

## 2016-05-10 ENCOUNTER — Other Ambulatory Visit (HOSPITAL_COMMUNITY): Payer: Self-pay | Admitting: Pulmonary Disease

## 2016-05-10 DIAGNOSIS — R0600 Dyspnea, unspecified: Secondary | ICD-10-CM

## 2016-05-17 ENCOUNTER — Other Ambulatory Visit (HOSPITAL_COMMUNITY): Payer: Medicare Other

## 2016-05-18 ENCOUNTER — Ambulatory Visit: Payer: Medicare Other | Admitting: Gastroenterology

## 2016-05-25 ENCOUNTER — Encounter: Payer: Medicare Other | Attending: Physical Medicine & Rehabilitation | Admitting: Physical Medicine & Rehabilitation

## 2016-05-25 ENCOUNTER — Encounter: Payer: Self-pay | Admitting: Physical Medicine & Rehabilitation

## 2016-05-25 ENCOUNTER — Telehealth: Payer: Self-pay | Admitting: Physical Medicine & Rehabilitation

## 2016-05-25 VITALS — BP 111/79 | HR 72 | Resp 14

## 2016-05-25 DIAGNOSIS — Z716 Tobacco abuse counseling: Secondary | ICD-10-CM | POA: Diagnosis not present

## 2016-05-25 DIAGNOSIS — G479 Sleep disorder, unspecified: Secondary | ICD-10-CM

## 2016-05-25 DIAGNOSIS — Z9981 Dependence on supplemental oxygen: Secondary | ICD-10-CM | POA: Insufficient documentation

## 2016-05-25 DIAGNOSIS — Z720 Tobacco use: Secondary | ICD-10-CM | POA: Diagnosis not present

## 2016-05-25 DIAGNOSIS — M791 Myalgia: Secondary | ICD-10-CM

## 2016-05-25 DIAGNOSIS — R269 Unspecified abnormalities of gait and mobility: Secondary | ICD-10-CM | POA: Insufficient documentation

## 2016-05-25 DIAGNOSIS — J439 Emphysema, unspecified: Secondary | ICD-10-CM | POA: Diagnosis not present

## 2016-05-25 DIAGNOSIS — M609 Myositis, unspecified: Secondary | ICD-10-CM

## 2016-05-25 DIAGNOSIS — F319 Bipolar disorder, unspecified: Secondary | ICD-10-CM | POA: Insufficient documentation

## 2016-05-25 DIAGNOSIS — G8929 Other chronic pain: Secondary | ICD-10-CM

## 2016-05-25 DIAGNOSIS — I10 Essential (primary) hypertension: Secondary | ICD-10-CM | POA: Insufficient documentation

## 2016-05-25 DIAGNOSIS — M545 Low back pain, unspecified: Secondary | ICD-10-CM

## 2016-05-25 DIAGNOSIS — J449 Chronic obstructive pulmonary disease, unspecified: Secondary | ICD-10-CM | POA: Insufficient documentation

## 2016-05-25 DIAGNOSIS — IMO0001 Reserved for inherently not codable concepts without codable children: Secondary | ICD-10-CM

## 2016-05-25 DIAGNOSIS — F3170 Bipolar disorder, currently in remission, most recent episode unspecified: Secondary | ICD-10-CM

## 2016-05-25 MED ORDER — METHOCARBAMOL 500 MG PO TABS: 500.0000 mg | ORAL_TABLET | Freq: Three times a day (TID) | ORAL | 1 refills | Status: DC | PRN

## 2016-05-25 MED ORDER — MELOXICAM 15 MG PO TABS: 15.0000 mg | ORAL_TABLET | Freq: Every day | ORAL | 1 refills | Status: DC

## 2016-05-25 NOTE — Telephone Encounter (Signed)
She can try Skelaxin 800 TID, if that is not covered by her insurance either, we can try baclofen 5mg  TID.  Thanks

## 2016-05-25 NOTE — Progress Notes (Signed)
Subjective:    Patient ID: Alicia Dixon, female    DOB: 01/14/1966, 4Vallery Sa89 y.o.   MRN: [id number]  HPI  50 y/o female with pmh of COPD on supplemental O2, HTN, coagulopathy, bipolar disorderTIA who presents with b/l low back pain R>L. Started ~10 years ago, getting progressively worse.  No alleviating factors.  Prolonged postures exacerbate the pain.  It is sharp and dull, constant.  Non-radiating. Today, severity is 10/10.  Denies associated numbness, tingling weakness. She has tried IBU, percocet, tylenol without benefit. She was seeing a previous pain management injections, she had trigger point injection, which helped.  She has had falls because of knee instability.  Pain limits activities of daily living.    Pain Inventory Average Pain 10 Pain Right Now 10 My pain is sharp, dull and aching  In the last 24 hours, has pain interfered with the following? General activity 0 Relation with others 1 Enjoyment of life 2 What TIME of day is your pain at its worst? daytime, night Sleep (in general) Poor  Pain is worse with: walking, sitting and standing Pain improves with: medication Relief from Meds: 1  Mobility walk without assistance how many minutes can you walk? 5 ability to climb steps?  yes do you drive?  yes Do you have any goals in this area?  yes  Function I need assistance with the following:  household duties and shopping Do you have any goals in this area?  no  Neuro/Psych trouble walking depression anxiety  Prior Studies bone scan new visit  Physicians involved in your care Primary care Veverly FellsJenny Bachman, [facility] new visit   No family history on file. Social History   Social History  . Marital status: Divorced    Spouse name: N/A  . Number of children: N/A  . Years of education: N/A   Social History Main Topics  . Smoking status: Current Every Day Smoker  . Smokeless tobacco: Never Used  . Alcohol use No  . Drug use: No  . Sexual activity: Not Asked    Other Topics Concern  . None   Social History Narrative  . None   Past Surgical History:  Procedure Laterality Date  . ABDOMINAL HYSTERECTOMY    . APPENDECTOMY    . CESAREAN SECTION    . CHOLECYSTECTOMY    . LUNG LOBECTOMY     right lung    Past Medical History:  Diagnosis Date  . Asthma   . Bipolar disorder (HCC)   . COPD (chronic obstructive pulmonary disease) (HCC)   . Depression   . Stroke (HCC)    BP 111/79 (BP Location: Right Arm, Patient Position: Sitting, Cuff Size: Large)   Pulse 72   Resp 14   SpO2 94%   Opioid Risk Score:   Fall Risk Score:  `1  Depression screen PHQ 2/9  Depression screen PHQ 2/9 05/25/2016  Decreased Interest 2  Down, Depressed, Hopeless 0  PHQ - 2 Score 2  Altered sleeping 3  Tired, decreased energy 3  Change in appetite 1  Feeling bad or failure about yourself  2  Trouble concentrating 0  Moving slowly or fidgety/restless 1  Suicidal thoughts 0  PHQ-9 Score 12    Review of Systems  Constitutional: Positive for unexpected weight change.  HENT: Negative.   Eyes: Negative.   Respiratory: Positive for shortness of breath and wheezing.        Oxygen therapy  Gastrointestinal: Positive for constipation.  Endocrine: Negative.  Genitourinary: Negative.   Musculoskeletal: Positive for back pain, gait problem and neck pain.  Skin: Negative.   Allergic/Immunologic: Negative.   Hematological: Negative.   Psychiatric/Behavioral: Positive for dysphoric mood. The patient is nervous/anxious.   All other systems reviewed and are negative.     Objective:   Physical Exam Gen: NAD. Vital signs reviewed HENT: Normocephalic, Atraumatic Eyes: EOMI, Conj WNL Cardio: S1, S2 normal, RRR Pulm: B/l clear to auscultation.  Effort normal. +Supplemental O2. Abd: Soft, non-distended, non-tender, BS+ MSK:  Gait mild antalgic.   TTP R>L lumbar paraspinal, gluteal muscles.  No edema.   FABERs limited due to pain with ROM.   Neg fortin  finger Neuro: CN II-XII grossly intact.    Sensation intact to light touch in all LE dermatomes  Reflexes 2+ throughout  Strength  4+/5 in all LLE myotomes    4/5 in all LE myotomes (pain inhibition) Skin: Warm and Dry    Assessment & Plan:  50 y/o female with pmh of COPD on supplemental O2, HTN, coagulopathy, bipolar disorderTIA who presents with b/l low back pain R>L.   1. Mechanical R>L low back pain  Heat/Cold not effective per pt  She states she does not have access and a phobia to pools  Will order PT for ROM, stretching, and eval for TENS  Will order Mobic 15mg  daily with food  Will order Robaxin 500mg  TID PRN  Will consider bracing in future  Will consider Biowave in future  2. COPD  On supplemental O2, 2L  Will be weary with benzo/TCA/narcotic administration  3. Sleep disturbance  Due to pain  Will order Robaxin 500mg  TID PRN  4. Myalgia  Will schedule for trigger point injections  5. Tobacco abuse  Counseled  Spoke to pt regarding different options and follow up with PCP  6. Bipolar disorder  Managed by Psych - pt on Gabapentin qHS, Xanax, and lamictal  7. Abnormality of gait  Referral to PT  Will order cane to reduce fall risk

## 2016-05-25 NOTE — Telephone Encounter (Signed)
Patient called to let us know that her insurance is not paying for Robaxin.  She would like to know if there is something else he can call in for her.  Please call her at [phone].

## 2016-05-29 ENCOUNTER — Ambulatory Visit (HOSPITAL_COMMUNITY)
Admission: RE | Admit: 2016-05-29 | Discharge: 2016-05-29 | Disposition: A | Discharge status: Home / Self Care | Payer: Medicare Other | Source: Ambulatory Visit | Attending: Pulmonary Disease | Admitting: Pulmonary Disease

## 2016-05-29 DIAGNOSIS — R0600 Dyspnea, unspecified: Secondary | ICD-10-CM

## 2016-05-29 NOTE — Telephone Encounter (Signed)
Skelaxin will not be covered. I have ordered the baclofen 10 mg tablet 1/2 tablet tid # 45

## 2016-05-30 ENCOUNTER — Encounter (HOSPITAL_BASED_OUTPATIENT_CLINIC_OR_DEPARTMENT_OTHER): Payer: Medicare Other | Admitting: Physical Medicine & Rehabilitation

## 2016-05-30 ENCOUNTER — Encounter: Payer: Self-pay | Admitting: Physical Medicine & Rehabilitation

## 2016-05-30 ENCOUNTER — Telehealth: Payer: Self-pay | Admitting: Physical Medicine & Rehabilitation

## 2016-05-30 VITALS — BP 101/77 | HR 72 | Resp 16

## 2016-05-30 DIAGNOSIS — IMO0001 Reserved for inherently not codable concepts without codable children: Secondary | ICD-10-CM

## 2016-05-30 DIAGNOSIS — M545 Low back pain: Secondary | ICD-10-CM | POA: Diagnosis not present

## 2016-05-30 DIAGNOSIS — M791 Myalgia: Secondary | ICD-10-CM | POA: Diagnosis not present

## 2016-05-30 DIAGNOSIS — M609 Myositis, unspecified: Secondary | ICD-10-CM

## 2016-05-30 MED ORDER — BACLOFEN 10 MG PO TABS: 5.0000 mg | ORAL_TABLET | Freq: Three times a day (TID) | ORAL | 0 refills | Status: DC

## 2016-05-30 NOTE — Telephone Encounter (Signed)
We can order Skelaxin 800 TID.  Thanks.

## 2016-05-30 NOTE — Telephone Encounter (Signed)
Baclofen sent to pharmacy. Pt is aware.

## 2016-05-30 NOTE — Telephone Encounter (Signed)
Patient was in today for a visit and Dr. Allena KatzPatel ordered Baclofen for patient.  She is allergic to it.   Can something else be called in for patient?  Please call patient.

## 2016-05-30 NOTE — Progress Notes (Signed)
Subjective:    Patient ID: Alicia Dixon, female    DOB: 08-22-1966, 50 y.o.   MRN: [id number]  HPI 50 y/o female with pmh of COPD on supplemental O2, HTN, coagulopathy, bipolar disorderTIA who presents with b/l low back pain R>L. Started ~10 years ago, getting progressively worse.  No alleviating factors.  Prolonged postures exacerbate the pain.  It is sharp and dull, constant.  Non-radiating. Today, severity is 10/10.  Denies associated numbness, tingling weakness. She has tried IBU, percocet, tylenol without benefit. She was seeing a previous pain management injections, she had trigger point injection, which helped.  She has had falls because of knee instability.  Pain limits activities of daily living.    Last clinic visit 05/25/16.  Seen today for trigger point injctions  Pain Inventory Average Pain 10 Pain Right Now 9 My pain is constant, sharp and dull  In the last 24 hours, has pain interfered with the following? General activity 9 Relation with others 9 Enjoyment of life 9 What TIME of day is your pain at its worst? daytime, night Sleep (in general) Poor  Pain is worse with: walking, bending, sitting, standing and some activites Pain improves with: rest and therapy/exercise Relief from Meds: 9  Mobility walk without assistance how many minutes can you walk? 5 ability to climb steps?  no do you drive?  yes  Function Do you have any goals in this area?  no  Neuro/Psych trouble walking depression anxiety  Prior Studies Any changes since last visit?  no  Physicians involved in your care [facility], [facility]   History reviewed. No pertinent family history. Social History   Social History  . Marital status: Divorced    Spouse name: N/A  . Number of children: N/A  . Years of education: N/A   Social History Main Topics  . Smoking status: Current Every Day Smoker  . Smokeless tobacco: Never Used  . Alcohol use No  . Drug use: No  . Sexual  activity: Not Asked   Other Topics Concern  . None   Social History Narrative  . None   Past Surgical History:  Procedure Laterality Date  . ABDOMINAL HYSTERECTOMY    . APPENDECTOMY    . CESAREAN SECTION    . CHOLECYSTECTOMY    . LUNG LOBECTOMY     right lung    Past Medical History:  Diagnosis Date  . Asthma   . Bipolar disorder (HCC)   . COPD (chronic obstructive pulmonary disease) (HCC)   . Depression   . Stroke (HCC)    BP 101/77   Pulse 72   Resp 16   SpO2 92%   Opioid Risk Score:   Fall Risk Score:  `1  Depression screen PHQ 2/9  Depression screen PHQ 2/9 05/25/2016  Decreased Interest 2  Down, Depressed, Hopeless 0  PHQ - 2 Score 2  Altered sleeping 3  Tired, decreased energy 3  Change in appetite 1  Feeling bad or failure about yourself  2  Trouble concentrating 0  Moving slowly or fidgety/restless 1  Suicidal thoughts 0  PHQ-9 Score 12    Review of Systems  HENT: Negative.   Eyes: Negative.   Respiratory: Positive for cough, shortness of breath and wheezing.        Oxygen therapy Respiratory infections   Gastrointestinal: Positive for constipation.  Endocrine: Negative.   Genitourinary: Negative.   Musculoskeletal: Positive for gait problem.  Skin: Negative.   Allergic/Immunologic: Negative.  Hematological: Negative.   Psychiatric/Behavioral: Positive for dysphoric mood. The patient is nervous/anxious.   All other systems reviewed and are negative.     Objective:   Physical Exam Gen: NAD. Vital signs reviewed HENT: Normocephalic, Atraumatic Eyes: EOMI, Conj WNL Cardio: S1, S2 normal, RRR Pulm: B/l clear to auscultation.  Effort normal. +Supplemental O2. Abd: Soft, non-distended, non-tender, BS+ MSK:  Gait mild antalgic.   TTP R>L lumbar paraspinal, gluteal muscles.  No edema.   FABERs limited due to pain with ROM.   Neg fortin finger Neuro: CN II-XII grossly intact.    Sensation intact to light touch in all LE  dermatomes  Reflexes 2+ throughout  Strength  4+/5 in all LLE myotomes    4/5 in all LE myotomes (pain inhibition) Skin: Warm and Dry    Assessment & Plan:  50 y/o female with pmh of COPD on supplemental O2, HTN, coagulopathy, bipolar disorderTIA who presents with b/l low back pain R>L.   1. Mechanical R>L low back pain  Heat/Cold not effective per pt  She states she does not have access and a phobia to pools  Ordered PT for ROM, stretching, and eval for TENS  Ordered Mobic 15mg  daily with food  Ordered Robaxin 500mg  TID PRN  Will consider bracing in future  Will consider Biowave in future  2. COPD  On supplemental O2, 2L  Will be weary with benzo/TCA/narcotic administration  3. Sleep disturbance  Due to pain  Ordered Robaxin 500mg  TID PRN  4. Myalgia  Trigger point injections performed  5. Tobacco abuse  Counseled  Spoke to pt regarding different options and follow up with PCP  6. Bipolar disorder  Managed by Psych - pt on Gabapentin qHS, Xanax, and lamictal  7. Abnormality of gait  Referral to PT  Ordered cane to reduce fall risk   Trigger point injection procedure note: Trigger Point Injection: Written consent was obtained for the patient. Trigger points were identified of right lumbar paraspinals and gluteal muscles. The areas were cleaned with alcohol, and each of  these trigger points were injected with 1 cc of 0.5% Marcaine. Needle draw back was performed. There were no complications from the procedure, and it was well tolerated.

## 2016-06-01 MED ORDER — TIZANIDINE HCL 2 MG PO TABS: 2.0000 mg | ORAL_TABLET | Freq: Three times a day (TID) | ORAL | 0 refills | Status: DC | PRN

## 2016-06-01 NOTE — Addendum Note (Signed)
Addended by: Doreene ElandSHUMAKER, SYBIL W on: 06/01/2016 10:01 AM   Modules accepted: Orders

## 2016-06-01 NOTE — Telephone Encounter (Signed)
Skelaxin will not be covered by her insurance.  Can she try tizanidine? (if so please give dose and directions)

## 2016-06-07 ENCOUNTER — Ambulatory Visit: Payer: Medicare Other | Admitting: Physical Medicine & Rehabilitation

## 2016-06-07 ENCOUNTER — Encounter (HOSPITAL_BASED_OUTPATIENT_CLINIC_OR_DEPARTMENT_OTHER): Payer: Medicare Other | Admitting: Physical Medicine & Rehabilitation

## 2016-06-07 ENCOUNTER — Encounter: Payer: Self-pay | Admitting: Physical Medicine & Rehabilitation

## 2016-06-07 VITALS — BP 108/78 | HR 78

## 2016-06-07 DIAGNOSIS — Z5181 Encounter for therapeutic drug level monitoring: Secondary | ICD-10-CM

## 2016-06-07 DIAGNOSIS — M791 Myalgia: Secondary | ICD-10-CM | POA: Diagnosis not present

## 2016-06-07 DIAGNOSIS — M609 Myositis, unspecified: Secondary | ICD-10-CM | POA: Diagnosis not present

## 2016-06-07 DIAGNOSIS — IMO0001 Reserved for inherently not codable concepts without codable children: Secondary | ICD-10-CM

## 2016-06-07 DIAGNOSIS — M545 Low back pain: Secondary | ICD-10-CM | POA: Diagnosis not present

## 2016-06-07 DIAGNOSIS — Z79899 Other long term (current) drug therapy: Secondary | ICD-10-CM

## 2016-06-07 NOTE — Addendum Note (Signed)
Addended by: Doreene ElandSHUMAKER, SYBIL W on: 06/07/2016 03:41 PM   Modules accepted: Orders

## 2016-06-07 NOTE — Progress Notes (Signed)
Trigger point injection procedure note (second injection, first 05/30/16) : Trigger Point Injection: Written consent was obtained for the patient. Trigger points were identified of right lumbar paraspinals and gluteal muscles. The areas were cleaned with alcohol, and each of  these trigger points were injected with 1 cc of 0.5% Marcaine. Needle draw back was performed. There were no complications from the procedure, and it was well tolerated.

## 2016-06-08 ENCOUNTER — Encounter (HOSPITAL_COMMUNITY): Payer: Self-pay

## 2016-06-08 ENCOUNTER — Ambulatory Visit (HOSPITAL_COMMUNITY): Payer: Medicare Other | Attending: Pulmonary Disease

## 2016-06-14 ENCOUNTER — Encounter: Payer: Medicare Other | Admitting: Physical Medicine & Rehabilitation

## 2016-06-14 ENCOUNTER — Telehealth: Payer: Self-pay | Admitting: Gastroenterology

## 2016-06-14 ENCOUNTER — Ambulatory Visit: Payer: Medicare Other | Admitting: Gastroenterology

## 2016-06-14 ENCOUNTER — Encounter: Payer: Self-pay | Admitting: Gastroenterology

## 2016-06-14 NOTE — Telephone Encounter (Signed)
PT WAS A NO SHOW AND LETTER SENT  °

## 2016-06-17 LAB — TOXASSURE SELECT,+ANTIDEPR,UR

## 2016-06-19 NOTE — Progress Notes (Signed)
Urine drug screen for this encounter is consistent for prescribed medications.   

## 2016-06-27 ENCOUNTER — Telehealth: Payer: Self-pay | Admitting: Physical Medicine & Rehabilitation

## 2016-06-27 NOTE — Telephone Encounter (Signed)
Yes, she can take the cough syrup.  Thanks.

## 2016-06-27 NOTE — Telephone Encounter (Signed)
Patient went to doctor and they prescribed her a cough syrup Hycodan and she wanted to know if it would be okay for her to take it.

## 2016-06-28 NOTE — Telephone Encounter (Signed)
Spoke with patient and let her know that Dr. Allena KatzPatel said it was okay for her to take the cough syrup.

## 2016-07-06 ENCOUNTER — Encounter: Payer: Medicare Other | Admitting: Physical Medicine & Rehabilitation

## 2016-07-18 ENCOUNTER — Encounter: Payer: Medicare Other | Attending: Physical Medicine & Rehabilitation | Admitting: Physical Medicine & Rehabilitation

## 2016-07-18 ENCOUNTER — Encounter: Payer: Self-pay | Admitting: Physical Medicine & Rehabilitation

## 2016-07-18 VITALS — BP 91/65 | HR 79 | Temp 98.7°F

## 2016-07-18 DIAGNOSIS — M545 Low back pain: Secondary | ICD-10-CM | POA: Insufficient documentation

## 2016-07-18 DIAGNOSIS — Z9981 Dependence on supplemental oxygen: Secondary | ICD-10-CM | POA: Insufficient documentation

## 2016-07-18 DIAGNOSIS — M791 Myalgia, unspecified site: Secondary | ICD-10-CM

## 2016-07-18 DIAGNOSIS — Z720 Tobacco use: Secondary | ICD-10-CM | POA: Insufficient documentation

## 2016-07-18 DIAGNOSIS — R269 Unspecified abnormalities of gait and mobility: Secondary | ICD-10-CM | POA: Insufficient documentation

## 2016-07-18 DIAGNOSIS — I10 Essential (primary) hypertension: Secondary | ICD-10-CM | POA: Insufficient documentation

## 2016-07-18 DIAGNOSIS — J449 Chronic obstructive pulmonary disease, unspecified: Secondary | ICD-10-CM | POA: Insufficient documentation

## 2016-07-18 DIAGNOSIS — G479 Sleep disorder, unspecified: Secondary | ICD-10-CM | POA: Insufficient documentation

## 2016-07-18 DIAGNOSIS — F319 Bipolar disorder, unspecified: Secondary | ICD-10-CM | POA: Insufficient documentation

## 2016-07-18 NOTE — Progress Notes (Signed)
Trigger point injection procedure note (last injection, 06/07/16) : Pt with good benefit lasting 4 weeks Trigger Point Injection: Written consent was obtained for the patient. Trigger points were identified of right lumbar paraspinals and gluteal muscles. The areas were cleaned with alcohol, and each of these trigger points were injected with 1 cc of 0.5% Marcaine. Needle draw back was performed. There were no complications from the procedure, and it was well tolerated.

## 2016-07-27 ENCOUNTER — Other Ambulatory Visit: Payer: Self-pay | Admitting: Physical Medicine & Rehabilitation

## 2016-07-31 ENCOUNTER — Telehealth: Payer: Self-pay | Admitting: Physical Medicine & Rehabilitation

## 2016-07-31 NOTE — Telephone Encounter (Signed)
patients referring/PCP has called about records and to see if patients has been consistent with appointments.  They have requested the last three office notes - which medical records is sending to them - however they are questioning if the patient has been consistent with her appointments because she is there to see them to get pain medication.  They are not comfortable prescribing such medication because the [facility]  They are requesting records on the patient to understand her plan of care and [facility].  The PCP just wanted [facility] to be aware and make sure that all are on the same page.

## 2016-08-01 NOTE — Telephone Encounter (Signed)
Faxed note from initial visit and [facility] about [facility] plan and treatment avoiding narcotics at present time.

## 2016-08-15 ENCOUNTER — Ambulatory Visit: Payer: Medicare Other | Admitting: Nurse Practitioner

## 2016-08-17 ENCOUNTER — Encounter: Payer: Self-pay | Admitting: Physical Medicine & Rehabilitation

## 2016-08-17 ENCOUNTER — Encounter: Payer: Medicare Other | Attending: Physical Medicine & Rehabilitation | Admitting: Physical Medicine & Rehabilitation

## 2016-08-17 VITALS — BP 91/71 | HR 89

## 2016-08-17 DIAGNOSIS — M545 Low back pain: Secondary | ICD-10-CM | POA: Insufficient documentation

## 2016-08-17 DIAGNOSIS — J449 Chronic obstructive pulmonary disease, unspecified: Secondary | ICD-10-CM | POA: Diagnosis not present

## 2016-08-17 DIAGNOSIS — G479 Sleep disorder, unspecified: Secondary | ICD-10-CM | POA: Insufficient documentation

## 2016-08-17 DIAGNOSIS — M791 Myalgia, unspecified site: Secondary | ICD-10-CM

## 2016-08-17 DIAGNOSIS — Z9981 Dependence on supplemental oxygen: Secondary | ICD-10-CM | POA: Insufficient documentation

## 2016-08-17 DIAGNOSIS — Z720 Tobacco use: Secondary | ICD-10-CM | POA: Insufficient documentation

## 2016-08-17 DIAGNOSIS — I10 Essential (primary) hypertension: Secondary | ICD-10-CM | POA: Insufficient documentation

## 2016-08-17 DIAGNOSIS — F319 Bipolar disorder, unspecified: Secondary | ICD-10-CM | POA: Insufficient documentation

## 2016-08-17 DIAGNOSIS — R269 Unspecified abnormalities of gait and mobility: Secondary | ICD-10-CM | POA: Insufficient documentation

## 2016-08-17 NOTE — Progress Notes (Signed)
Trigger point injection procedure note: (last injection 06/07/16). Pt states she gets good relief.   Trigger Point Injection: Written consent was obtained for the patient. Trigger points were identified of right lumbar paraspinals and gluteal muscles. The areas were cleaned with alcohol, and each of  these trigger points were injected with 1 cc of 0.5% Marcaine. Needle draw back was performed. There were no complications from the procedure, and it was well tolerated.

## 2016-08-20 ENCOUNTER — Other Ambulatory Visit: Payer: Self-pay | Admitting: Physical Medicine & Rehabilitation

## 2016-08-20 ENCOUNTER — Other Ambulatory Visit: Payer: Self-pay | Admitting: *Deleted

## 2016-08-20 MED ORDER — BACLOFEN 10 MG PO TABS: 10.0000 mg | ORAL_TABLET | Freq: Three times a day (TID) | ORAL | 1 refills | Status: DC

## 2016-08-20 NOTE — Telephone Encounter (Signed)
Christen BameRonnie called and said Dr Allena KatzPatel was going to call in a muscle relaxer Friday  to pharmacy she thought it started with a "B".  They do not have it.  Please advise.

## 2016-08-20 NOTE — Telephone Encounter (Signed)
I ordered her 10 mg of baclofen TID today, but if she prefers that to tizanidine, we can DC the baclofen and prescribe her tizanidine.  Thanks.

## 2016-08-27 ENCOUNTER — Telehealth: Payer: Self-pay

## 2016-08-27 NOTE — Telephone Encounter (Signed)
She can have tizanidine 2mg  TID PRN until her next visit.  Thanks.

## 2016-08-27 NOTE — Telephone Encounter (Signed)
Patient called, stated "baclofen is one of her allergies, if possibly could she do flexeril?" Please advise

## 2016-08-28 ENCOUNTER — Encounter: Payer: Self-pay | Admitting: Nurse Practitioner

## 2016-08-28 ENCOUNTER — Ambulatory Visit (INDEPENDENT_AMBULATORY_CARE_PROVIDER_SITE_OTHER): Payer: Medicare Other | Admitting: Nurse Practitioner

## 2016-08-28 ENCOUNTER — Telehealth: Payer: Self-pay

## 2016-08-28 DIAGNOSIS — R768 Other specified abnormal immunological findings in serum: Secondary | ICD-10-CM | POA: Diagnosis not present

## 2016-08-28 DIAGNOSIS — R1310 Dysphagia, unspecified: Secondary | ICD-10-CM | POA: Diagnosis not present

## 2016-08-28 DIAGNOSIS — K219 Gastro-esophageal reflux disease without esophagitis: Secondary | ICD-10-CM | POA: Diagnosis not present

## 2016-08-28 MED ORDER — TIZANIDINE HCL 2 MG PO TABS: 2.0000 mg | ORAL_TABLET | Freq: Three times a day (TID) | ORAL | 0 refills | Status: DC | PRN

## 2016-08-28 NOTE — Assessment & Plan Note (Signed)
The patient suffers from chronic GERD and is currently on Protonix 40 mg daily. She has had esophageal dilations in the past. Long-standing GERD as a possible etiology for her dysphagia. Of note, the patient is on a significant amount of NSAIDs including every 6 hour ibuprofen and aspirin powders 3 times a day. Recommend she avoid these, discussed with pain management other options. Return for follow-up in 2 months.

## 2016-08-28 NOTE — Telephone Encounter (Signed)
Alicia Dixon notified. I removed baclofen from her medications since she is allergic ( it is under allergies) and she will take the tizanidine.

## 2016-08-28 NOTE — Assessment & Plan Note (Signed)
The patient describes worsening dysphagia over the past couple/2 months. She has solid food dysphagia, especially worse with meats. Also occasional pill dysphagia and issues with liquids. Had a particularly concerning episode about a month ago where "food got stuck in it wouldn't go up or wouldn't go down." She has previously had upper endoscopy with esophageal dilation, the last one was approximately 2013 in [place]. At this point she would likely benefit from an EGD with possible dilation on propofol/MAC. However, given her chronic lung issues we will seek clearance from Dr. Juanetta GoslingHawkins with pulmonary first.  Tentatively schedule patient for EGD with propofol/MAC with Dr. Jena Gaussourk in near future: the risks, benefits, and alternatives have been discussed with the patient in detail. The patient states understanding and desires to proceed.  The patient suffers from chronic pain and is on multiple medications including Xanax, Neurontin, ibuprofen, Remeron. We will plan for the procedure on propofol/MAC to promote adequate sedation.

## 2016-08-28 NOTE — Telephone Encounter (Signed)
I have faxed a request for clearance to Dr. Juanetta GoslingHawkins for pt to be scheduled for EGD with possible dilation in the very near future with Dr. Jena Gaussourk.

## 2016-08-28 NOTE — Progress Notes (Signed)
Primary Care Physician:  Phyllis GingerBAUCOM, JENNY B, PA-C Primary Gastroenterologist:  Dr. Jena Gaussourk  Chief Complaint  Patient presents with  . Dysphagia    feels like everything gets stuck in throat, had dilation 4 yrs ago    HPI:   Alicia Dixon is a 50 y.o. female who presents on referral from primary care for dysphagia. Per patient, had dilation 4 years ago. Previously saw digestive health specialists at [facility]. Last saw GI 11/28/2011 at which point noted that the patient had recently been released from the practice due to missing for interval appointments but did have a appointment rescheduled. They note some dysphagia requiring esophageal dilation previously and is scheduled to see Dr. Samuella CotaPandya for this. Release GI notes indicate chronic hepatitis C, genotype 1A with documented immunity to hepatitis B. She was encouraged to seek care from Dr. Suzie PortelaPayne year related to hepatitis C because she had been dismissed from [facility]. She was given hepatitis A vaccines #1 and recommended to get #2 in about 6 months.  Do not see any primary care notes in the system. No primary care notes included with the chart either. The patient was initially scheduled to be seen by our practice on 06/14/2016 that was a no-show.  Today she states she has been having dysphagia for a few months and is getting worse. Symptoms typically with solid foods and meats. Did have one episode about a month ago where some solid food "got stuck, wouldn't go up and wouldn't go down." Worse with meats. Occasional issues with pills and liquids as well. Have GERD, on Protonix daily. GERD symptom exacerbation depending on diet, sweets tends to cause breakthrough. Has once a week breakthrough. Has had esophageal dilation, thinks it was last in 2013 by Dr. Samuella CotaPandya in [place]. Denies hematochezia, melena, N/V, abdominal pain. Denies chest pain, worsening dyspnea, dizziness, lightheadedness, syncope, near syncope.  Denies any other upper or lower GI symptoms. Is supposed to be on continuous O2 but forgot to change her portable O2.  Takes Ibuprofen about every 6 hours for back pain. Takes "a lot" of ASA powders, typically 3 a day.  Sees Dr. Juanetta GoslingHawkins for pulmonary. Dees Dr. Allena KatzPatel for pain management.  States her HCV was a false positive cause she has Alpha-1 antitrypsin deficiency.  Past Medical History:  Diagnosis Date  . Asthma   . Bipolar disorder (HCC)   . COPD (chronic obstructive pulmonary disease) (HCC)   . Depression   . Stroke [facility](HCC)     Past Surgical History:  Procedure Laterality Date  . ABDOMINAL HYSTERECTOMY    . APPENDECTOMY    . CESAREAN SECTION    . CHOLECYSTECTOMY    . LUNG LOBECTOMY     right lung     Current Outpatient Prescriptions  Medication Sig Dispense Refill  . ALPRAZolam (XANAX) 1 MG tablet Take 1 mg by mouth 4 (four) times daily.  3  . atenolol (TENORMIN) 25 MG tablet Take 50 mg by mouth daily.   1  . atorvastatin (LIPITOR) 20 MG tablet Take 20 mg by mouth at bedtime.  1  . cetirizine (ZYRTEC) 10 MG tablet Take 10 mg by mouth daily.    Marland Kitchen. gabapentin (NEURONTIN) 300 MG capsule Take 900 mg by mouth at bedtime.  2  . ibuprofen (ADVIL,MOTRIN) 600 MG tablet Take 1 tablet by mouth every 6 (six) hours as needed. FOR PAIN  0  . ipratropium-albuterol (DUONEB) 0.5-2.5 (3) MG/3ML SOLN Take 3 mLs by  nebulization every 6 (six) hours as needed (shortness of breath).     . lamoTRIgine (LAMICTAL) 150 MG tablet Take 300 mg by mouth at bedtime.  3  . meloxicam (MOBIC) 15 MG tablet TAKE 1 TABLET(15 MG) BY MOUTH DAILY 30 tablet 0  . mirtazapine (REMERON) 45 MG tablet Take 45 mg by mouth at bedtime.  3  . montelukast (SINGULAIR) 10 MG tablet Take 10 mg by mouth every evening.  2  . perphenazine (TRILAFON) 8 MG tablet Take 24 mg by mouth at bedtime.  3  . predniSONE (DELTASONE) 10 MG tablet Take 10 mg by mouth daily.   5  . PROAIR HFA 108 (90 Base) MCG/ACT inhaler Inhale 2 puffs into  the lungs every 4 (four) hours as needed. Shortness of breath  2  . SAPHRIS 10 MG SUBL Place 20 mg under the tongue every evening.   3  . SYMBICORT 160-4.5 MCG/ACT inhaler Inhale 2 puffs into the lungs 2 (two) times daily.  2  . tiZANidine (ZANAFLEX) 2 MG tablet Take 1 tablet (2 mg total) by mouth every 8 (eight) hours as needed for muscle spasms. 90 tablet 0   No current facility-administered medications for this visit.     Allergies as of 08/28/2016 - Review Complete 08/28/2016  Allergen Reaction Noted  . Other Anaphylaxis 12/26/2015  . Baclofen  05/19/2013  . Flonase  [fluticasone propionate] Other (See Comments) 12/26/2015  . Gadopentetate dimeglumine Other (See Comments) 12/26/2015  . Lortab [hydrocodone-acetaminophen]  05/19/2013  . Seroquel [quetiapine fumarate]  05/19/2013  . Sudafed [pseudoephedrine hcl]  05/19/2013  . Trazodone and nefazodone Nausea And Vomiting 04/17/2016    Family History  Problem Relation Age of Onset  . Colon cancer Maternal Aunt     Social History   Social History  . Marital status: Divorced    Spouse name: N/A  . Number of children: N/A  . Years of education: N/A   Occupational History  . Not on file.   Social History Main Topics  . Smoking status: Current Every Day Smoker    Packs/day: 0.75    Types: Cigarettes  . Smokeless tobacco: Never Used  . Alcohol use No  . Drug use: No  . Sexual activity: Not on file   Other Topics Concern  . Not on file   Social History Narrative  . No narrative on file    Review of Systems: General: Negative for anorexia, weight loss, fever, chills, fatigue, weakness. ENT: Negative for hoarseness. CV: Negative for chest pain, angina, palpitations, peripheral edema.  Respiratory: Negative for worsening dyspnea at rest, dyspnea on exertion, cough, sputum, wheezing. States currently her breathing is at baseline. GI: See history of present illness. MS: Admits chronic pain, sees pain management.  Derm:  Negative for rash or itching.  Endo: Negative for unusual weight change.  Heme: Negative for bruising or bleeding. Allergy: Negative for rash or hives.    Physical Exam: BP 139/83   Pulse (!) 113   Temp 98 F (36.7 C) (Oral)   Ht 5\' 8"  (1.727 m)   Wt 215 lb (97.5 kg)   BMI 32.69 kg/m  General:   Obese female. Alert and oriented. Pleasant and cooperative. Well-nourished and well-developed.  Head:  Normocephalic and atraumatic. Eyes:  Without icterus, sclera clear and conjunctiva pink.  Ears:  Normal auditory acuity. Cardiovascular:  S1, S2 present without murmurs appreciated. Extremities without clubbing or edema. Respiratory:  Bilateral inspiratory and expiratory wheezes noted. No distress.  Gastrointestinal:  +  BS, rounded but soft, non-tender and non-distended. No HSM noted. No guarding or rebound. No masses appreciated.  Rectal:  Deferred  Musculoskalatal:  Symmetrical without gross deformities. Neurologic:  Alert and oriented x4;  grossly normal neurologically. Psych:  Alert and cooperative. Normal mood and affect. Heme/Lymph/Immune: No excessive bruising noted.    08/28/2016 9:41 AM   Disclaimer: This note was dictated with voice recognition software. Similar sounding words can inadvertently be transcribed and may not be corrected upon review.

## 2016-08-28 NOTE — Patient Instructions (Signed)
1. We will tentatively schedule your endoscopy with possible dilation for you. 2. We will request clearance from her pulmonary doctor, Dr. Juanetta GoslingHawkins. 3. If he clears you for your procedure we will proceed and finalize scheduling. 4. Have your labs drawn when you're able to. 5. Return for follow-up in 2 months.

## 2016-08-28 NOTE — Assessment & Plan Note (Signed)
The patient previously saw gastroenterology at [facility] for hepatitis C. The patient now states that it was a false positive due to her alpha 1 antitrypsin deficiency. I cannot find any labs to indicate if RNA was completed. At this point, in order to settle the matter, I will check a hepatitis C antibody with reflex to RNA for confirmation. Return for follow-up in 2 months.

## 2016-08-28 NOTE — Progress Notes (Signed)
cc'ed to pcp °

## 2016-09-04 NOTE — Telephone Encounter (Signed)
Just faxed @nd  request to Dr. Juanetta GoslingHawkins.

## 2016-09-04 NOTE — Telephone Encounter (Signed)
The fax failed 3 times today.  I called the office and left Vm with Joy, the nurse. Gave her the info and asked her to find out from Dr. Juanetta GoslingHawkins if pt can be cleared for EGd and possible dilation.

## 2016-09-06 NOTE — Telephone Encounter (Signed)
I received a fax from Dr. Juanetta GoslingHawkins office. Pt has appt with him on 09/10/2016 at 2:30 pm to get surgical clearance,

## 2016-09-06 NOTE — Telephone Encounter (Signed)
Forwarding FYI to [employer] [facility] and to [facility] Clinical Staff.

## 2016-09-06 NOTE — Telephone Encounter (Signed)
Noted  

## 2016-09-11 ENCOUNTER — Other Ambulatory Visit: Payer: Self-pay

## 2016-09-11 ENCOUNTER — Telehealth: Payer: Self-pay

## 2016-09-11 NOTE — Telephone Encounter (Signed)
Called patient to set up EGD/PROPOFOL because we was holding a spot for her on 09/20/16 but she can not do that day because she did not have a driver. I told her that we did not anything elas before her 30 days was us and she would have to come back into the office. Dr. Juanetta GoslingHawkins office is going to fax over a letter that clears her for the EGD.

## 2016-09-13 ENCOUNTER — Encounter: Payer: Self-pay | Admitting: Registered Nurse

## 2016-09-13 ENCOUNTER — Encounter: Payer: Medicare Other | Attending: Physical Medicine & Rehabilitation | Admitting: Registered Nurse

## 2016-09-13 VITALS — BP 101/66 | HR 75

## 2016-09-13 DIAGNOSIS — G8929 Other chronic pain: Secondary | ICD-10-CM

## 2016-09-13 DIAGNOSIS — G479 Sleep disorder, unspecified: Secondary | ICD-10-CM | POA: Insufficient documentation

## 2016-09-13 DIAGNOSIS — Z720 Tobacco use: Secondary | ICD-10-CM | POA: Diagnosis not present

## 2016-09-13 DIAGNOSIS — R269 Unspecified abnormalities of gait and mobility: Secondary | ICD-10-CM | POA: Insufficient documentation

## 2016-09-13 DIAGNOSIS — Z9981 Dependence on supplemental oxygen: Secondary | ICD-10-CM | POA: Diagnosis not present

## 2016-09-13 DIAGNOSIS — I10 Essential (primary) hypertension: Secondary | ICD-10-CM | POA: Insufficient documentation

## 2016-09-13 DIAGNOSIS — M545 Low back pain: Secondary | ICD-10-CM | POA: Insufficient documentation

## 2016-09-13 DIAGNOSIS — F319 Bipolar disorder, unspecified: Secondary | ICD-10-CM | POA: Diagnosis not present

## 2016-09-13 DIAGNOSIS — J449 Chronic obstructive pulmonary disease, unspecified: Secondary | ICD-10-CM | POA: Insufficient documentation

## 2016-09-13 DIAGNOSIS — M791 Myalgia, unspecified site: Secondary | ICD-10-CM

## 2016-09-13 DIAGNOSIS — Z716 Tobacco abuse counseling: Secondary | ICD-10-CM

## 2016-09-13 DIAGNOSIS — J439 Emphysema, unspecified: Secondary | ICD-10-CM

## 2016-09-13 NOTE — Progress Notes (Signed)
Subjective:    Patient ID: Alicia Dixon, female    DOB: [birth year], 50 y.o.   MRN: [id number]  HPI: Ms. Alicia PeyerRonnie Dixon is a 50 year old female who returns for follow up appointment and medication refill. She states her pain is located in her Lower back mainly right side. States her pain has increased in intensity, she will be scheduled for a trigger point injection with Dr. Allena KatzPatel next week she verbalizes understanding.She rates her pain 7. Her current exercise regime is attending physical therapy two days a week and walking short distances and performing stretching exercises. Ms. Alicia Dixon states her physical therapists stated she needed a brace, this will be discussed with Dr. Allena KatzPatel, she verbalizes understanding.  Educated on smoking cessation, she states she is down to 7 cigarettes a day.  Pain Inventory Average Pain 9 Pain Right Now 7 My pain is constant, sharp and dull  In the last 24 hours, has pain interfered with the following? General activity 9 Relation with others 9 Enjoyment of life 4 What TIME of day is your pain at its worst? morning Sleep (in general) Fair  Pain is worse with: walking and sitting Pain improves with: therapy/exercise, TENS and injections Relief from Meds: 0  Mobility walk without assistance how many minutes can you walk? 10 ability to climb steps?  no do you drive?  yes Do you have any goals in this area?  yes  Function disabled: date disabled 2004 I need assistance with the following:  meal prep, household duties and shopping  Neuro/Psych No problems in this area  Prior Studies Any changes since last visit?  no  Physicians involved in your care Any changes since last visit?  no   Family History  Problem Relation Age of Onset  . Colon cancer Maternal Aunt    Social History   Social History  . Marital status: Divorced    Spouse name: N/A  . Number of children: N/A  . Years of education: N/A   Social History Main Topics  .  Smoking status: Current Every Day Smoker    Packs/day: 0.75    Types: Cigarettes  . Smokeless tobacco: Never Used  . Alcohol use No  . Drug use: No  . Sexual activity: Not on file   Other Topics Concern  . Not on file   Social History Narrative  . No narrative on file   Past Surgical History:  Procedure Laterality Date  . ABDOMINAL HYSTERECTOMY    . APPENDECTOMY    . CESAREAN SECTION    . CHOLECYSTECTOMY    . LUNG LOBECTOMY     right lung    Past Medical History:  Diagnosis Date  . Asthma   . Bipolar disorder (HCC)   . COPD (chronic obstructive pulmonary disease) (HCC)   . Depression   . Stroke [facility](HCC)    There were no vitals taken for this visit.  Opioid Risk Score:   Fall Risk Score:  `1  Depression screen PHQ 2/9  Dep[facility] screen [facility] 06/07/2016 05/25/2016  Decreased Interest 1 2  Down, Depressed, Hopeless 1 0  PHQ - 2 Score 2 2  Altered sleeping - 3  Tired, decreased energy - 3  Change in appetite - 1  Feeling bad or failure about yourself  - 2  Trouble concentrating - 0  Moving slowly or fidgety/restless - 1  Suicidal thoughts - 0  PHQ-9 Score - 12   Review of Systems  Constitutional: Negative.  HENT: Negative.   Eyes: Negative.   Respiratory: Positive for shortness of breath.   Cardiovascular: Negative.   Gastrointestinal: Negative.   Endocrine: Negative.   Genitourinary: Negative.   Musculoskeletal: Positive for back pain and gait problem.  Skin: Negative.   Allergic/Immunologic: Negative.   Hematological: Negative.   Psychiatric/Behavioral: Negative.   All other systems reviewed and are negative.      Objective:   Physical Exam  Constitutional: She is oriented to person, place, and time. She appears well-developed and well-nourished.  HENT:  Head: Normocephalic and atraumatic.  Neck: Normal range of motion. Neck supple.  Cardiovascular: Normal rate and regular rhythm.   Pulmonary/Chest: Effort normal and breath sounds normal.    Continuous Oxygen 2 liters nasal cannula  Musculoskeletal:  Normal Muscle Bulk and Muscle Testing Reveals: Upper Extremities: Right: Decreased ROM 90 Degrees and Muscle Strength 5/5 Left: Full ROM and Muscle Strength 5/5 Lumbar Paraspinal Tenderness: L-3-L-5 Mainly Right Side Lower Extremities: Full ROM and Muscle Strength 5/5 Arises from Table with ease Narrow Based Gait      Neurological: She is alert and oriented to person, place, and time.  Skin: Skin is warm and dry.  Psychiatric: She has a normal mood and affect.  Nursing note and vitals reviewed.     Assessment & Plan:   1. Mechanical Right  low back pain: Continue Physical Therapy and HEP  Continue Mobic and Robaxin 2. COPD: Continuous Oxygen 2 liters nasal cannula: Pulmonary Following. Educated on smoking cessation 3. Myalgia: Scheduled for Trigger Point Injection with Dr. Allena KatzPatel. 4. Muscle Spasms: Continue Tizanidine 5. Tobacco Abuse: Educated on smoking cessation 6. Bipolar Disorder: Psychiatry Following 30 minutes of face to face patient care time was spent during this visit. All questions encouraged and answered.   F/U in 1 week

## 2016-09-13 NOTE — Telephone Encounter (Signed)
Noted, no further recommendations. 

## 2016-09-20 ENCOUNTER — Encounter (HOSPITAL_BASED_OUTPATIENT_CLINIC_OR_DEPARTMENT_OTHER): Payer: Medicare Other | Admitting: Physical Medicine & Rehabilitation

## 2016-09-20 ENCOUNTER — Encounter: Payer: Self-pay | Admitting: Physical Medicine & Rehabilitation

## 2016-09-20 VITALS — BP 99/64 | HR 84 | Resp 14

## 2016-09-20 DIAGNOSIS — M791 Myalgia, unspecified site: Secondary | ICD-10-CM

## 2016-09-20 DIAGNOSIS — M545 Low back pain: Secondary | ICD-10-CM | POA: Diagnosis not present

## 2016-09-20 NOTE — Progress Notes (Signed)
Trigger point injection procedure note: (last injection 08/17/16). Pt states she gets significant relief.   Trigger Point Injection: Written consent was obtained for the patient. Trigger points were identified of right lumbosacral PSPs and gluteal muscles (x5). The areas were cleaned with alcohol, and each of these trigger points were injected with 1 cc of 0.5% Marcaine. Needle draw back was performed. There were no complications from the procedure, and it was well tolerated.

## 2016-09-24 ENCOUNTER — Other Ambulatory Visit: Payer: Self-pay | Admitting: Physical Medicine & Rehabilitation

## 2016-10-17 ENCOUNTER — Other Ambulatory Visit: Payer: Self-pay | Admitting: Physical Medicine & Rehabilitation

## 2016-10-18 ENCOUNTER — Encounter: Payer: Medicare Other | Attending: Physical Medicine & Rehabilitation | Admitting: Physical Medicine & Rehabilitation

## 2016-10-18 VITALS — BP 101/71 | HR 74 | Resp 12

## 2016-10-18 DIAGNOSIS — R269 Unspecified abnormalities of gait and mobility: Secondary | ICD-10-CM | POA: Diagnosis not present

## 2016-10-18 DIAGNOSIS — M791 Myalgia, unspecified site: Secondary | ICD-10-CM

## 2016-10-18 DIAGNOSIS — I10 Essential (primary) hypertension: Secondary | ICD-10-CM | POA: Diagnosis not present

## 2016-10-18 DIAGNOSIS — J449 Chronic obstructive pulmonary disease, unspecified: Secondary | ICD-10-CM | POA: Diagnosis not present

## 2016-10-18 DIAGNOSIS — F319 Bipolar disorder, unspecified: Secondary | ICD-10-CM | POA: Insufficient documentation

## 2016-10-18 DIAGNOSIS — G479 Sleep disorder, unspecified: Secondary | ICD-10-CM | POA: Diagnosis not present

## 2016-10-18 DIAGNOSIS — M545 Low back pain: Secondary | ICD-10-CM | POA: Insufficient documentation

## 2016-10-18 DIAGNOSIS — Z720 Tobacco use: Secondary | ICD-10-CM | POA: Diagnosis not present

## 2016-10-18 DIAGNOSIS — Z9981 Dependence on supplemental oxygen: Secondary | ICD-10-CM | POA: Diagnosis not present

## 2016-10-18 NOTE — Progress Notes (Signed)
Trigger point injection procedure note: (last injection 12//17). Pt states she gets significant relief.   Trigger Point Injection: Written consent was obtained for the patient. Trigger points were identified of right lumbosacral PSPs and gluteal muscles (x5). The areas were cleaned with alcohol, vapocoolant spray was administered, and each of these trigger points were injected with 1 cc of 0.5% Marcaine. Needle draw back was performed. There were no complications from the procedure, and it was well tolerated.

## 2016-10-30 ENCOUNTER — Ambulatory Visit: Payer: Medicare Other | Admitting: Nurse Practitioner

## 2016-11-16 ENCOUNTER — Encounter: Payer: Medicare Other | Attending: Physical Medicine & Rehabilitation | Admitting: Physical Medicine & Rehabilitation

## 2016-11-16 ENCOUNTER — Encounter: Payer: Self-pay | Admitting: Physical Medicine & Rehabilitation

## 2016-11-16 VITALS — BP 117/77 | HR 99

## 2016-11-16 DIAGNOSIS — J439 Emphysema, unspecified: Secondary | ICD-10-CM | POA: Diagnosis not present

## 2016-11-16 DIAGNOSIS — Z9981 Dependence on supplemental oxygen: Secondary | ICD-10-CM | POA: Insufficient documentation

## 2016-11-16 DIAGNOSIS — M545 Low back pain: Secondary | ICD-10-CM

## 2016-11-16 DIAGNOSIS — G479 Sleep disorder, unspecified: Secondary | ICD-10-CM | POA: Diagnosis not present

## 2016-11-16 DIAGNOSIS — I10 Essential (primary) hypertension: Secondary | ICD-10-CM | POA: Diagnosis not present

## 2016-11-16 DIAGNOSIS — J449 Chronic obstructive pulmonary disease, unspecified: Secondary | ICD-10-CM | POA: Diagnosis not present

## 2016-11-16 DIAGNOSIS — M791 Myalgia, unspecified site: Secondary | ICD-10-CM

## 2016-11-16 DIAGNOSIS — F319 Bipolar disorder, unspecified: Secondary | ICD-10-CM | POA: Diagnosis not present

## 2016-11-16 DIAGNOSIS — G8929 Other chronic pain: Secondary | ICD-10-CM | POA: Diagnosis not present

## 2016-11-16 DIAGNOSIS — Z720 Tobacco use: Secondary | ICD-10-CM | POA: Diagnosis not present

## 2016-11-16 DIAGNOSIS — R269 Unspecified abnormalities of gait and mobility: Secondary | ICD-10-CM | POA: Diagnosis not present

## 2016-11-16 NOTE — Progress Notes (Addendum)
Subjective:    Patient ID: Alicia Dixon, female    DOB: 1965-10-10, 51 y.o.   MRN: [id number]  HPI  51 y/o female with pmh of COPD on supplemental O2, HTN, coagulopathy, bipolar disorderTIA who presents for follow up for b/l low back pain R>L. Initially patient stated.  Started ~10 years ago, getting progressively worse.  No alleviating factors.  Prolonged postures exacerbate the pain.  It is sharp and dull, constant.  Non-radiating. Today, severity is 10/10.  Denies associated numbness, tingling weakness. She has tried IBU, percocet, tylenol without benefit. She was seeing a previous pain management injections, she had trigger point injection, which helped.  She has had falls because of knee instability.  Pain limits activities of daily living.    Last clinic visit 10/18/16 for trigger point injections.Today, patient states has has improved.  The trigger point injections provide good relief.  Heat/cold are providing relief.  She had good benefit from PT and continues HEP.  She has gets good relief with TENS unit.  She believes Mobic and Tizanidine are helping as well.  Her sleep has improved. She has cut back to 3 cig/day.  She usually uses the cane she states, but did not bring it today.   Pain Inventory Average Pain 10 Pain Right Now 9 My pain is constant, sharp and dull  In the last 24 hours, has pain interfered with the following? General activity 9 Relation with others 9 Enjoyment of life 9 What TIME of day is your pain at its worst? daytime, night Sleep (in general) Poor  Pain is worse with: walking, bending, sitting, standing and some activites Pain improves with: rest and therapy/exercise Relief from Meds: 9  Mobility walk without assistance how many minutes can you walk? 5 ability to climb steps?  no do you drive?  yes  Function Do you have any goals in this area?  no  Neuro/Psych trouble walking depression anxiety  Prior Studies Any changes since last visit?   no  Physicians involved in your care Primary care [facility] FellsJenny Bachman, [facility]   Family History  Problem Relation Age of Onset  . Colon cancer Maternal Aunt    Social History   Social History  . Marital status: Divorced    Spouse name: N/A  . Number of children: N/A  . Years of education: N/A   Social History Main Topics  . Smoking status: Current Every Day Smoker    Packs/day: 0.75    Types: Cigarettes  . Smokeless tobacco: Never Used  . Alcohol use No  . Drug use: No  . Sexual activity: Not Asked   Other Topics Concern  . None   Social History Narrative  . None   Past Surgical History:  Procedure Laterality Date  . ABDOMINAL HYSTERECTOMY    . APPENDECTOMY    . CESAREAN SECTION    . CHOLECYSTECTOMY    . LUNG LOBECTOMY     right lung    Past Medical History:  Diagnosis Date  . Asthma   . Bipolar disorder (HCC)   . COPD (chronic obstructive pulmonary disease) (HCC)   . Depression   . Stroke (HCC)    BP 117/77   Pulse 99   SpO2 92%   Opioid Risk Score:   Fall Risk Score:  `1  Depression screen PHQ 2/9  Depression screen [facility] 06/07/2016 05/25/2016  Decreased Interest 1 2  Down, Depressed, Hopeless 1 0  PHQ - 2 Score 2 2  Altered sleeping -  3  Tired, decreased energy - 3  Change in appetite - 1  Feeling bad or failure about yourself  - 2  Trouble concentrating - 0  Moving slowly or fidgety/restless - 1  Suicidal thoughts - 0  PHQ-9 Score - 12    Review of Systems  Constitutional: Negative.   HENT: Negative.   Eyes: Negative.   Respiratory: Positive for cough, shortness of breath and wheezing.        Oxygen therapy Respiratory infections   Cardiovascular: Negative.   Gastrointestinal: Negative.   Endocrine: Negative.   Genitourinary: Negative.   Musculoskeletal: Positive for gait problem.  Skin: Negative.   Allergic/Immunologic: Negative.   Hematological: Negative.   Psychiatric/Behavioral: Positive for dysphoric mood. The patient is  nervous/anxious.   All other systems reviewed and are negative.     Objective:   Physical Exam  Gen: NAD. Vital signs reviewed HENT: Normocephalic, Atraumatic Eyes: EOMI. No discharge.  Cardio: RRR. No JVD. Pulm: B/l clear to auscultation.  Effort normal. +Supplemental O2. Abd: Soft, BS+ MSK:  Gait mild antalgic.   TTP R>L lumbar paraspinal, gluteal muscles (improved).  No edema.   Neg fortin finger Neuro: Sensation intact to light touch in all LE dermatomes  Strength  4/5 in all LLE myotomes (?participation)    4-/5 in all RLE myotomes (?participation) Skin: Warm and Dry. Intact.    Assessment & Plan:  51 y/o female with pmh of COPD on supplemental O2, HTN, coagulopathy, bipolar disorder, TIA who presents for follow up of b/l low back pain R>L.   1. Mechanical R>L low back pain  She states she does not have access and a phobia to pools  Cont Heat/Cold   Cont HEP, completed PT  Cont TENS  Cont Mobic 15mg  daily with food, educated pt not to take other NSAIDs concomitantly   Cont Tizanidine 2mg  BID PRN  Will consider bracing in future  2. COPD  On supplemental O2, 2L  Will be weary with benzo/TCA/narcotic administration  3. Sleep disturbance  Due to pain  Cont meds per Psych and TizanidineOrdered Robaxin 500mg  TID PRN  4. Myalgia  Trigger point injections performed  5. Tobacco abuse  Educated on continuing to cut back  6. Bipolar disorder  Managed by Psych - pt on Gabapentin qHS, Xanax, and lamictal  7. Abnormality of gait  Cont cane, encouraged complaince  Trigger point injection procedure note: Trigger Point Injection: Written consent was obtained for the patient. Trigger points were identified of right lumbar paraspinals and gluteal muscles. The areas were cleaned with alcohol, and each of  these trigger points were sprayed with vapocoolant spray and then injected with 1 cc of 0.5% Marcaine (x3). Needle draw back was performed. There were no complications from  the procedure, and it was well tolerated.

## 2016-11-28 ENCOUNTER — Other Ambulatory Visit: Payer: Self-pay | Admitting: Physical Medicine & Rehabilitation

## 2016-12-14 ENCOUNTER — Encounter: Payer: Self-pay | Admitting: Physical Medicine & Rehabilitation

## 2016-12-14 ENCOUNTER — Encounter: Payer: Medicare Other | Attending: Physical Medicine & Rehabilitation | Admitting: Physical Medicine & Rehabilitation

## 2016-12-14 VITALS — BP 105/73 | HR 104

## 2016-12-14 DIAGNOSIS — R269 Unspecified abnormalities of gait and mobility: Secondary | ICD-10-CM | POA: Insufficient documentation

## 2016-12-14 DIAGNOSIS — G479 Sleep disorder, unspecified: Secondary | ICD-10-CM | POA: Insufficient documentation

## 2016-12-14 DIAGNOSIS — M545 Low back pain: Secondary | ICD-10-CM | POA: Insufficient documentation

## 2016-12-14 DIAGNOSIS — J449 Chronic obstructive pulmonary disease, unspecified: Secondary | ICD-10-CM | POA: Diagnosis not present

## 2016-12-14 DIAGNOSIS — M791 Myalgia, unspecified site: Secondary | ICD-10-CM

## 2016-12-14 DIAGNOSIS — F319 Bipolar disorder, unspecified: Secondary | ICD-10-CM | POA: Diagnosis not present

## 2016-12-14 DIAGNOSIS — Z9981 Dependence on supplemental oxygen: Secondary | ICD-10-CM | POA: Diagnosis not present

## 2016-12-14 DIAGNOSIS — Z720 Tobacco use: Secondary | ICD-10-CM | POA: Diagnosis not present

## 2016-12-14 DIAGNOSIS — I10 Essential (primary) hypertension: Secondary | ICD-10-CM | POA: Insufficient documentation

## 2016-12-14 NOTE — Progress Notes (Addendum)
Trigger point injection procedure note: Trigger Point Injection: Written consent was obtained for the patient. Trigger points were identified of right lumbosacral paraspinals and bilateral gluteal muscles. The areas were cleaned with alcohol, and each of  these trigger points were sprayed with vapocoolant spray and then injected with 1 cc of 0.5% Marcaine (x4). Needle draw back was performed. There were no complications from the procedure, and it was well tolerated.

## 2016-12-26 ENCOUNTER — Other Ambulatory Visit: Payer: Self-pay | Admitting: Physical Medicine & Rehabilitation

## 2017-01-11 ENCOUNTER — Encounter: Payer: Self-pay | Admitting: Physical Medicine & Rehabilitation

## 2017-01-11 ENCOUNTER — Encounter: Payer: Medicare Other | Attending: Physical Medicine & Rehabilitation | Admitting: Physical Medicine & Rehabilitation

## 2017-01-11 VITALS — BP 97/66 | HR 79

## 2017-01-11 DIAGNOSIS — M545 Low back pain: Secondary | ICD-10-CM | POA: Insufficient documentation

## 2017-01-11 DIAGNOSIS — G479 Sleep disorder, unspecified: Secondary | ICD-10-CM | POA: Diagnosis not present

## 2017-01-11 DIAGNOSIS — J449 Chronic obstructive pulmonary disease, unspecified: Secondary | ICD-10-CM | POA: Insufficient documentation

## 2017-01-11 DIAGNOSIS — Z720 Tobacco use: Secondary | ICD-10-CM | POA: Insufficient documentation

## 2017-01-11 DIAGNOSIS — M791 Myalgia, unspecified site: Secondary | ICD-10-CM

## 2017-01-11 DIAGNOSIS — F319 Bipolar disorder, unspecified: Secondary | ICD-10-CM | POA: Insufficient documentation

## 2017-01-11 DIAGNOSIS — R269 Unspecified abnormalities of gait and mobility: Secondary | ICD-10-CM | POA: Diagnosis not present

## 2017-01-11 DIAGNOSIS — Z9981 Dependence on supplemental oxygen: Secondary | ICD-10-CM | POA: Insufficient documentation

## 2017-01-11 DIAGNOSIS — I10 Essential (primary) hypertension: Secondary | ICD-10-CM | POA: Insufficient documentation

## 2017-01-11 NOTE — Progress Notes (Signed)
Trigger point injection procedure note: Trigger Point Injection: Written consent was obtained for the patient. Trigger points were identified of right lumbosacral paraspinals and gluteal muscles. The areas were cleaned with alcohol, and each of  these trigger points were sprayed with vapocoolant spray and then injected with 1 cc of 0.5% Marcaine (x3). Needle draw back was performed. There were no complications from the procedure, and it was well tolerated.

## 2017-01-17 ENCOUNTER — Encounter (HOSPITAL_COMMUNITY): Payer: Self-pay | Admitting: *Deleted

## 2017-01-17 ENCOUNTER — Inpatient Hospital Stay (HOSPITAL_COMMUNITY)
Admission: AD | Admit: 2017-01-17 | Discharge: 2017-01-20 | DRG: 193 | Disposition: A | Discharge status: Home / Self Care | Payer: Medicare Other | Attending: Pulmonary Disease | Admitting: Pulmonary Disease

## 2017-01-17 ENCOUNTER — Inpatient Hospital Stay (HOSPITAL_COMMUNITY): Payer: Medicare Other

## 2017-01-17 DIAGNOSIS — Z7952 Long term (current) use of systemic steroids: Secondary | ICD-10-CM

## 2017-01-17 DIAGNOSIS — J9621 Acute and chronic respiratory failure with hypoxia: Secondary | ICD-10-CM | POA: Diagnosis present

## 2017-01-17 DIAGNOSIS — F3162 Bipolar disorder, current episode mixed, moderate: Secondary | ICD-10-CM | POA: Diagnosis present

## 2017-01-17 DIAGNOSIS — Z7951 Long term (current) use of inhaled steroids: Secondary | ICD-10-CM

## 2017-01-17 DIAGNOSIS — J441 Chronic obstructive pulmonary disease with (acute) exacerbation: Secondary | ICD-10-CM | POA: Diagnosis present

## 2017-01-17 DIAGNOSIS — F17210 Nicotine dependence, cigarettes, uncomplicated: Secondary | ICD-10-CM | POA: Diagnosis present

## 2017-01-17 DIAGNOSIS — K219 Gastro-esophageal reflux disease without esophagitis: Secondary | ICD-10-CM | POA: Diagnosis present

## 2017-01-17 DIAGNOSIS — J189 Pneumonia, unspecified organism: Secondary | ICD-10-CM | POA: Diagnosis present

## 2017-01-17 DIAGNOSIS — Z79899 Other long term (current) drug therapy: Secondary | ICD-10-CM

## 2017-01-17 DIAGNOSIS — Z902 Acquired absence of lung [part of]: Secondary | ICD-10-CM | POA: Diagnosis not present

## 2017-01-17 DIAGNOSIS — Z91018 Allergy to other foods: Secondary | ICD-10-CM | POA: Diagnosis not present

## 2017-01-17 DIAGNOSIS — Z791 Long term (current) use of non-steroidal anti-inflammatories (NSAID): Secondary | ICD-10-CM | POA: Diagnosis not present

## 2017-01-17 DIAGNOSIS — Z888 Allergy status to other drugs, medicaments and biological substances status: Secondary | ICD-10-CM | POA: Diagnosis not present

## 2017-01-17 DIAGNOSIS — Z885 Allergy status to narcotic agent status: Secondary | ICD-10-CM

## 2017-01-17 DIAGNOSIS — J440 Chronic obstructive pulmonary disease with acute lower respiratory infection: Secondary | ICD-10-CM | POA: Diagnosis present

## 2017-01-17 DIAGNOSIS — R05 Cough: Secondary | ICD-10-CM | POA: Diagnosis present

## 2017-01-17 DIAGNOSIS — Z90710 Acquired absence of both cervix and uterus: Secondary | ICD-10-CM

## 2017-01-17 DIAGNOSIS — I10 Essential (primary) hypertension: Secondary | ICD-10-CM | POA: Diagnosis present

## 2017-01-17 DIAGNOSIS — Z8673 Personal history of transient ischemic attack (TIA), and cerebral infarction without residual deficits: Secondary | ICD-10-CM

## 2017-01-17 DIAGNOSIS — I6930 Unspecified sequelae of cerebral infarction: Secondary | ICD-10-CM

## 2017-01-17 DIAGNOSIS — Z91041 Radiographic dye allergy status: Secondary | ICD-10-CM | POA: Diagnosis not present

## 2017-01-17 LAB — COMPREHENSIVE METABOLIC PANEL
ALK PHOS: 93 U/L (ref 38–126)
ALT: 24 U/L (ref 14–54)
ANION GAP: 7 (ref 5–15)
AST: 24 U/L (ref 15–41)
Albumin: 3.2 g/dL — ABNORMAL LOW (ref 3.5–5.0)
BILIRUBIN TOTAL: 0.4 mg/dL (ref 0.3–1.2)
BUN: 7 mg/dL (ref 6–20)
CALCIUM: 8.8 mg/dL — AB (ref 8.9–10.3)
CO2: 28 mmol/L (ref 22–32)
Chloride: 105 mmol/L (ref 101–111)
Creatinine, Ser: 0.75 mg/dL (ref 0.44–1.00)
GFR calc Af Amer: >60 mL/min (ref >60)
GFR calc non Af Amer: >60 mL/min (ref >60)
GLUCOSE: 159 mg/dL — AB (ref 65–99)
POTASSIUM: 4 mmol/L (ref 3.5–5.1)
Sodium: 140 mmol/L (ref 135–145)
TOTAL PROTEIN: 6.4 g/dL — AB (ref 6.5–8.1)

## 2017-01-17 LAB — CBC WITH DIFFERENTIAL/PLATELET
BASOS ABS: 0 10*3/uL (ref 0.0–0.1)
BASOS PCT: 0 %
EOS PCT: 0 %
Eosinophils Absolute: 0 10*3/uL (ref 0.0–0.7)
LYMPHS PCT: 13 %
Lymphs Abs: 1.5 10*3/uL (ref 0.7–4.0)
MONO ABS: 0.4 10*3/uL (ref 0.1–1.0)
Monocytes Relative: 4 %
NEUTROS ABS: 9.1 10*3/uL — AB (ref 1.7–7.7)
Neutrophils Relative %: 83 %

## 2017-01-17 LAB — CBC
HEMATOCRIT: 48.3 % — AB (ref 36.0–46.0)
HEMOGLOBIN: 15.8 g/dL — AB (ref 12.0–15.0)
MCH: 31 pg (ref 26.0–34.0)
MCHC: 32.7 g/dL (ref 30.0–36.0)
MCV: 94.9 fL (ref 78.0–100.0)
Platelets: 264 10*3/uL (ref 150–400)
RBC: 5.09 MIL/uL (ref 3.87–5.11)
RDW: 14.7 % (ref 11.5–15.5)
WBC: 11.4 10*3/uL — ABNORMAL HIGH (ref 4.0–10.5)

## 2017-01-17 LAB — CREATININE, SERUM
Creatinine, Ser: 0.75 mg/dL (ref 0.44–1.00)
GFR calc Af Amer: >60 mL/min (ref >60)
GFR calc non Af Amer: >60 mL/min (ref >60)

## 2017-01-17 MED ORDER — SODIUM CHLORIDE 0.9% FLUSH: 3.0000 mL | INTRAVENOUS | Status: DC | PRN

## 2017-01-17 MED ORDER — ALPRAZOLAM 1 MG PO TABS
1.0000 mg | ORAL_TABLET | Freq: Four times a day (QID) | ORAL | Status: DC
  Administered 2017-01-17 – 2017-01-18 (×5): 1 mg via ORAL
  Filled 2017-01-17 (×7): qty 1

## 2017-01-17 MED ORDER — MONTELUKAST SODIUM 10 MG PO TABS
10.0000 mg | ORAL_TABLET | Freq: Every evening | ORAL | Status: DC
  Administered 2017-01-17 – 2017-01-19 (×2): 10 mg via ORAL
  Filled 2017-01-17 (×3): qty 1

## 2017-01-17 MED ORDER — PERPHENAZINE 8 MG PO TABS
24.0000 mg | ORAL_TABLET | Freq: Every day | ORAL | Status: DC
  Administered 2017-01-17 – 2017-01-19 (×3): 24 mg via ORAL
  Filled 2017-01-17 (×4): qty 3

## 2017-01-17 MED ORDER — METHYLPREDNISOLONE SODIUM SUCC 40 MG IJ SOLR
40.0000 mg | Freq: Two times a day (BID) | INTRAMUSCULAR | Status: DC
  Administered 2017-01-17 – 2017-01-20 (×6): 40 mg via INTRAVENOUS
  Filled 2017-01-17 (×6): qty 1

## 2017-01-17 MED ORDER — MIRTAZAPINE 15 MG PO TABS
45.0000 mg | ORAL_TABLET | Freq: Every day | ORAL | Status: DC
  Administered 2017-01-17 – 2017-01-19 (×3): 45 mg via ORAL
  Filled 2017-01-17 (×3): qty 1

## 2017-01-17 MED ORDER — ENOXAPARIN SODIUM 40 MG/0.4ML ~~LOC~~ SOLN
40.0000 mg | SUBCUTANEOUS | Status: DC
  Administered 2017-01-17 – 2017-01-19 (×3): 40 mg via SUBCUTANEOUS
  Filled 2017-01-17 (×3): qty 0.4

## 2017-01-17 MED ORDER — ACETAMINOPHEN 650 MG RE SUPP: 650.0000 mg | Freq: Four times a day (QID) | RECTAL | Status: DC | PRN

## 2017-01-17 MED ORDER — IPRATROPIUM-ALBUTEROL 0.5-2.5 (3) MG/3ML IN SOLN
3.0000 mL | Freq: Four times a day (QID) | RESPIRATORY_TRACT | Status: DC | PRN
  Administered 2017-01-18 – 2017-01-20 (×3): 3 mL via RESPIRATORY_TRACT
  Filled 2017-01-17 (×4): qty 3

## 2017-01-17 MED ORDER — LAMOTRIGINE 100 MG PO TABS
300.0000 mg | ORAL_TABLET | Freq: Every day | ORAL | Status: DC
  Administered 2017-01-17 – 2017-01-19 (×3): 300 mg via ORAL
  Filled 2017-01-17 (×3): qty 3

## 2017-01-17 MED ORDER — ACETAMINOPHEN 325 MG PO TABS: 650.0000 mg | ORAL_TABLET | Freq: Four times a day (QID) | ORAL | Status: DC | PRN

## 2017-01-17 MED ORDER — DOXEPIN HCL 25 MG PO CAPS
75.0000 mg | ORAL_CAPSULE | Freq: Every day | ORAL | Status: DC
  Administered 2017-01-17 – 2017-01-19 (×3): 75 mg via ORAL
  Filled 2017-01-17 (×3): qty 3

## 2017-01-17 MED ORDER — GABAPENTIN 300 MG PO CAPS
900.0000 mg | ORAL_CAPSULE | Freq: Every day | ORAL | Status: DC
  Administered 2017-01-17 – 2017-01-19 (×3): 900 mg via ORAL
  Filled 2017-01-17 (×3): qty 3

## 2017-01-17 MED ORDER — SODIUM CHLORIDE 0.9 % IV SOLN: 250.0000 mL | INTRAVENOUS | Status: DC | PRN

## 2017-01-17 MED ORDER — SODIUM CHLORIDE 0.9% FLUSH
3.0000 mL | Freq: Two times a day (BID) | INTRAVENOUS | Status: DC
  Administered 2017-01-17 – 2017-01-19 (×3): 3 mL via INTRAVENOUS

## 2017-01-17 MED ORDER — ONDANSETRON HCL 4 MG PO TABS
4.0000 mg | ORAL_TABLET | Freq: Four times a day (QID) | ORAL | Status: DC | PRN
  Administered 2017-01-18: 4 mg via ORAL
  Filled 2017-01-17: qty 1

## 2017-01-17 MED ORDER — LORATADINE 10 MG PO TABS
10.0000 mg | ORAL_TABLET | Freq: Every day | ORAL | Status: DC
  Administered 2017-01-18 – 2017-01-20 (×3): 10 mg via ORAL
  Filled 2017-01-17 (×4): qty 1

## 2017-01-17 MED ORDER — ATORVASTATIN CALCIUM 20 MG PO TABS
20.0000 mg | ORAL_TABLET | Freq: Every day | ORAL | Status: DC
  Administered 2017-01-17 – 2017-01-19 (×3): 20 mg via ORAL
  Filled 2017-01-17 (×3): qty 1

## 2017-01-17 MED ORDER — MOMETASONE FURO-FORMOTEROL FUM 200-5 MCG/ACT IN AERO
2.0000 | INHALATION_SPRAY | Freq: Two times a day (BID) | RESPIRATORY_TRACT | Status: DC
  Administered 2017-01-17 – 2017-01-20 (×6): 2 via RESPIRATORY_TRACT
  Filled 2017-01-17: qty 8.8

## 2017-01-17 MED ORDER — SODIUM CHLORIDE 0.9% FLUSH
3.0000 mL | Freq: Two times a day (BID) | INTRAVENOUS | Status: DC
  Administered 2017-01-17 (×2): 3 mL via INTRAVENOUS

## 2017-01-17 MED ORDER — ASENAPINE MALEATE 5 MG SL SUBL
20.0000 mg | SUBLINGUAL_TABLET | Freq: Every evening | SUBLINGUAL | Status: DC
  Administered 2017-01-17 – 2017-01-19 (×3): 20 mg via SUBLINGUAL
  Filled 2017-01-17 (×5): qty 4

## 2017-01-17 MED ORDER — ONDANSETRON HCL 4 MG/2ML IJ SOLN: 4.0000 mg | Freq: Four times a day (QID) | INTRAMUSCULAR | Status: DC | PRN

## 2017-01-17 MED ORDER — DEXTROSE 5 % IV SOLN
1.0000 g | INTRAVENOUS | Status: DC
  Administered 2017-01-17 – 2017-01-19 (×3): 1 g via INTRAVENOUS
  Filled 2017-01-17 (×5): qty 10

## 2017-01-17 MED ORDER — MELOXICAM 7.5 MG PO TABS
7.5000 mg | ORAL_TABLET | Freq: Every day | ORAL | Status: DC
  Administered 2017-01-17: 7.5 mg via ORAL
  Filled 2017-01-17 (×2): qty 1

## 2017-01-17 MED ORDER — DEXTROSE 5 % IV SOLN
500.0000 mg | INTRAVENOUS | Status: DC
  Administered 2017-01-17 – 2017-01-19 (×3): 500 mg via INTRAVENOUS
  Filled 2017-01-17 (×5): qty 500

## 2017-01-18 LAB — HIV ANTIBODY (ROUTINE TESTING W REFLEX): HIV Screen 4th Generation wRfx: NONREACTIVE

## 2017-01-18 MED ORDER — GUAIFENESIN ER 600 MG PO TB12
1200.0000 mg | ORAL_TABLET | Freq: Two times a day (BID) | ORAL | Status: DC
  Administered 2017-01-18 – 2017-01-20 (×5): 1200 mg via ORAL
  Filled 2017-01-18 (×5): qty 2

## 2017-01-18 MED ORDER — MELOXICAM 7.5 MG PO TABS
7.5000 mg | ORAL_TABLET | Freq: Every day | ORAL | Status: DC
  Administered 2017-01-18 – 2017-01-19 (×2): 7.5 mg via ORAL
  Filled 2017-01-18 (×3): qty 1

## 2017-01-18 NOTE — H&P (Deleted)
  The note originally documented on this encounter has been moved the the encounter in which it belongs.  

## 2017-01-18 NOTE — H&P (Signed)
Alicia Dixon, SOVA               ACCOUNT NO.:  [id number]  MEDICAL RECORD NO.:  [id number]  LOCATION:                                 FACILITY:  PHYSICIAN:  Marlaina Coburn L. Juanetta Gosling, M.D.DATE OF BIRTH:  1966/10/08  DATE OF ADMISSION:  01/17/2017 DATE OF DISCHARGE:  04/15/2018LH                             HISTORY & PHYSICAL   REASON FOR ADMISSION:  Pneumonia.  HISTORY:  Alicia Dixon is a 51 year old, Caucasian female who has a long known history of COPD.  She had been in her usual state of fairly poor health at home, when she developed increasing trouble with cough and congestion.  She was diagnosed with pneumonia and started on antibiotics and steroids.  However, she has had fairly poor response.  She is still coughing, still congested, more short of breath.  She is using her oxygen, but is having more and more trouble.  She had another chest x- ray done, though I do not have access to that, apparently showed more trouble with pneumonia.  Her antibiotics were changed to Levaquin 750 mg daily, but she is still not doing well and because she has failed outpatient treatment, I have elected to admit her to the hospital.  She has been coughing up some sputum.  She has been using her oxygen.  She has had several admissions for COPD exacerbation, but nothing in the last several months.  PAST MEDICAL HISTORY:  Positive for asthma/COPD, bipolar disease, previous history of stroke, chronic hypoxic respiratory failure.  PAST SURGICAL HISTORY:  Surgically, she has had a C-section, a cholecystectomy, appendectomy, abdominal hysterectomy, and right lung lobectomy, and I am not quite sure why she had that.  SOCIAL HISTORY:  She has a 60 pack year smoking history.  She does not use any alcohol or illicit drugs.  REVIEW OF SYSTEMS:  Except as mentioned above, 10-point review of systems is negative.  PHYSICAL EXAMINATION:  CONSTITUTIONAL:  She is awake and alert and in mild distress.  She is  wearing nasal oxygen.  She is coughing during the exam. EYES:  Pupils react.  EOMI. EARS, NOSE, MOUTH, AND THROAT:  Her mucous membranes are moist.  She has multiple missing teeth.  Her hearing is grossly normal. CARDIOVASCULAR:  Her heart is regular with mild tachycardia.  No edema. RESPIRATORY:  Her respiratory effort has increased.  Her lungs show rales bilaterally and some wheezing bilaterally. GASTROINTESTINAL:  Her abdomen is soft with no masses.  Bowel sounds are present and active. MUSCULOSKELETAL:  Normal strength. NEUROLOGICAL:  No focal abnormalities. PSYCHIATRIC:  She is anxious, but that is her normal situation.  ASSESSMENT:  She has pneumonia.  She has not responded well to outpatient treatment.  She has chronic obstructive pulmonary disease at baseline.  She has chronic hypoxic respiratory failure at baseline.  She has bipolar disease at baseline.  The plan is to admit for IV treatments.     Quante Pettry L. Juanetta Gosling, M.D.   ______________________________ Oneal Deputy. Juanetta Gosling, M.D.    ELH/MEDQ  D:  01/17/2017  T:  01/18/2017  Job:  [id number]

## 2017-01-18 NOTE — Care Management Note (Signed)
Case Management Note  Patient Details  Name: Alicia Dixon MRN: [id number] Date of Birth: 1966-05-17  Subjective/Objective:                  Pt admitted with pnuemonia. Chart reviewed for CM needs. Pt is form home, lives with husband and is ind with ADL's. She has PCP, transportation to appointments and insurance with drug coverage. She does not wear supplemental oxygen at home pta.    Action/Plan: No CM needs anticipated. Plan for return home with self care. Will need to wean oxygen prior to DC, if unable to do so will need home oxygen assessment. Will cont to follow.  Expected Discharge Date:  01/21/17               Expected Discharge Plan:  Home/Self Care  In-House Referral:  NA  Discharge planning Services  CM Consult  Post Acute Care Choice:  NA Choice offered to:  NA  Status of Service:  In process, will continue to follow  Malcolm Metro, RN 01/18/2017, 10:05 AM

## 2017-01-18 NOTE — Progress Notes (Signed)
Subjective: She says she hasn't improved very much. She is still coughing. This is mostly nonproductive. No hemoptysis. No chest pain except with cough. No abdominal pain nausea vomiting or diarrhea  Objective: Vital signs in last 24 hours: Temp:  [97.7 F (36.5 C)-98.4 F (36.9 C)] 98.4 F (36.9 C) (04/13 0519) Pulse Rate:  [78-108] 108 (04/13 0519) Resp:  [16-20] 20 (04/13 0519) BP: (101-119)/(62-73) 119/73 (04/13 0519) SpO2:  [90 %-93 %] 90 % (04/13 0803) Weight:  [98.7 kg (217 lb 11.2 oz)-98.9 kg (218 lb)] 98.9 kg (218 lb) (04/13 0500) Weight change:  Last BM Date: 01/15/17  Intake/Output from previous day: 04/12 0701 - 04/13 0700 In: 300 [IV Piggyback:300] Out: -   PHYSICAL EXAM General appearance: alert, cooperative and no distress Resp: rhonchi bilaterally and wheezes bilaterally Cardio: regular rate and rhythm, S1, S2 normal, no murmur, click, rub or gallop GI: soft, non-tender; bowel sounds normal; no masses,  no organomegaly Extremities: extremities normal, atraumatic, no cyanosis or edema Skin warm and dry. Mucous membranes are moist. Pupils are reactive  Lab Results:  Results for orders placed or performed during the hospital encounter of 01/17/17 (from the past 48 hour(s))  HIV antibody (Routine Testing)     Status: None   Collection Time: 01/17/17  1:21 PM  Result Value Ref Range   HIV Screen 4th Generation wRfx Non Reactive Non Reactive    Comment: (NOTE) Performed At: [facility] [place], [place] [id number] Lindon Romp MD EQ:[id number]   CBC     Status: Abnormal   Collection Time: 01/17/17  1:21 PM  Result Value Ref Range   WBC 11.4 (H) 4.0 - 10.5 K/uL   RBC 5.09 3.87 - 5.11 MIL/uL   Hemoglobin 15.8 (H) 12.0 - 15.0 g/dL   HCT 48.3 (H) 36.0 - 46.0 %   MCV 94.9 78.0 - 100.0 fL   MCH 31.0 26.0 - 34.0 pg   MCHC 32.7 30.0 - 36.0 g/dL   RDW 14.7 11.5 - 15.5 %   Platelets 264 150 - 400 K/uL  Creatinine, serum     Status: None    Collection Time: 01/17/17  1:21 PM  Result Value Ref Range   Creatinine, Ser 0.75 0.44 - 1.00 mg/dL   GFR calc non Af Amer >60 >60 mL/min   GFR calc Af Amer >60 >60 mL/min    Comment: (NOTE) The eGFR has been calculated using the CKD EPI equation. This calculation has not been validated in all clinical situations. eGFR's persistently <60 mL/min signify possible Chronic Kidney Disease.   Comprehensive metabolic panel     Status: Abnormal   Collection Time: 01/17/17  1:25 PM  Result Value Ref Range   Sodium 140 135 - 145 mmol/L   Potassium 4.0 3.5 - 5.1 mmol/L   Chloride 105 101 - 111 mmol/L   CO2 28 22 - 32 mmol/L   Glucose, Bld 159 (H) 65 - 99 mg/dL   BUN 7 6 - 20 mg/dL   Creatinine, Ser 0.75 0.44 - 1.00 mg/dL   Calcium 8.8 (L) 8.9 - 10.3 mg/dL   Total Protein 6.4 (L) 6.5 - 8.1 g/dL   Albumin 3.2 (L) 3.5 - 5.0 g/dL   AST 24 15 - 41 U/L   ALT 24 14 - 54 U/L   Alkaline Phosphatase 93 38 - 126 U/L   Total Bilirubin 0.4 0.3 - 1.2 mg/dL   GFR calc non Af Amer >60 >60 mL/min   GFR calc  Af Amer >60 >60 mL/min    Comment: (NOTE) The eGFR has been calculated using the CKD EPI equation. This calculation has not been validated in all clinical situations. eGFR's persistently <60 mL/min signify possible Chronic Kidney Disease.    Anion gap 7 5 - 15  CBC WITH DIFFERENTIAL     Status: Abnormal   Collection Time: 01/17/17  1:25 PM  Result Value Ref Range   WBC DUPLICATE 4.0 - 54.0 K/uL   RBC DUPLICATE 9.81 - 1.91 MIL/uL   Hemoglobin DUPLICATE 47.8 - 29.5 g/dL   HCT DUPLICATE 62.1 - 30.8 %   MCV DUPLICATE 65.7 - 846.9 fL   MCH DUPLICATE 62.9 - 52.8 pg   MCHC DUPLICATE 41.3 - 24.4 g/dL   RDW DUPLICATE 01.0 - 27.2 %   Platelets DUPLICATE 536 - 644 K/uL   Neutrophils Relative % 83 %   Neutro Abs 9.1 (H) 1.7 - 7.7 K/uL   Lymphocytes Relative 13 %   Lymphs Abs 1.5 0.7 - 4.0 K/uL   Monocytes Relative 4 %   Monocytes Absolute 0.4 0.1 - 1.0 K/uL   Eosinophils Relative 0 %    Eosinophils Absolute 0.0 0.0 - 0.7 K/uL   Basophils Relative 0 %   Basophils Absolute 0.0 0.0 - 0.1 K/uL    ABGS No results for input(s): PHART, PO2ART, TCO2, HCO3 in the last 72 hours.  Invalid input(s): PCO2 CULTURES No results found for this or any previous visit (from the past 240 hour(s)). Studies/Results: X-ray Chest Pa And Lateral  Result Date: 01/17/2017 CLINICAL DATA:  Pneumonia EXAM: CHEST  2 VIEW COMPARISON:  Chest x-ray 12/26/2015 FINDINGS: Normal heart size and stable mediastinal contours. There is a prominent fat pad along the lower heart. Stable asymmetric thickening at the right apex which is a postoperative site. Hyperinflation with streaky lung opacities favoring atelectasis or scarring. These have progressed at the right base since prior. No Kerley lines, air bronchogram, or pneumothorax. IMPRESSION: 1. Atelectatic or scar like opacities at the bases, increased from 12/26/2015. 2. COPD. 3. Unchanged thickening at the postoperative right apex. Electronically Signed   By: Monte Fantasia M.D.   On: 01/17/2017 15:11    Medications:  Prior to Admission:  Prescriptions Prior to Admission  Medication Sig Dispense Refill Last Dose  . ALPRAZolam (XANAX) 1 MG tablet Take 1 mg by mouth 4 (four) times daily.  3 01/17/2017 at Unknown time  . atenolol (TENORMIN) 25 MG tablet Take 1 tablet by mouth daily.   01/17/2017 at 0800  . atorvastatin (LIPITOR) 20 MG tablet Take 20 mg by mouth at bedtime.  1 01/16/2017 at Unknown time  . cetirizine (ZYRTEC) 10 MG tablet Take 10 mg by mouth daily.   01/17/2017 at Unknown time  . doxepin (SINEQUAN) 75 MG capsule TK 1 C PO QHS  1 01/16/2017 at Unknown time  . DULERA 200-5 MCG/ACT AERO INL 2 PFS PO BID  12 01/17/2017 at Unknown time  . gabapentin (NEURONTIN) 300 MG capsule Take 900 mg by mouth at bedtime.  2 01/16/2017 at Unknown time  . ipratropium-albuterol (DUONEB) 0.5-2.5 (3) MG/3ML SOLN Take 3 mLs by nebulization every 6 (six) hours as needed  (shortness of breath).    01/17/2017 at Unknown time  . lamoTRIgine (LAMICTAL) 150 MG tablet Take 300 mg by mouth at bedtime.  3 01/16/2017 at Unknown time  . levofloxacin (LEVAQUIN) 750 MG tablet Take 1 tablet by mouth daily.   01/17/2017 at Unknown time  . lidocaine (XYLOCAINE)  5 % ointment APPLY TO AFFECTED AREA TID PRN  1 unknown  . meloxicam (MOBIC) 15 MG tablet TAKE 1 TABLET(15 MG) BY MOUTH DAILY 30 tablet 2 01/16/2017 at Unknown time  . mirtazapine (REMERON) 45 MG tablet Take 45 mg by mouth at bedtime.  3 01/16/2017 at Unknown time  . montelukast (SINGULAIR) 10 MG tablet Take 10 mg by mouth every evening.  2 01/16/2017 at Unknown time  . pantoprazole (PROTONIX) 40 MG tablet Take 1 tablet by mouth at bedtime.   01/16/2017 at Unknown time  . perphenazine (TRILAFON) 8 MG tablet Take 32 mg by mouth at bedtime.   3 01/16/2017 at Unknown time  . predniSONE (DELTASONE) 10 MG tablet Take 10 mg by mouth daily.   5 01/16/2017 at Unknown time  . PROAIR HFA 108 (90 Base) MCG/ACT inhaler Inhale 2 puffs into the lungs every 4 (four) hours as needed. Shortness of breath  2 01/17/2017 at Unknown time  . SAPHRIS 10 MG SUBL Place 20 mg under the tongue every evening.   3 01/16/2017 at Unknown time  . SYMBICORT 160-4.5 MCG/ACT inhaler Inhale 2 puffs into the lungs 2 (two) times daily.  2 01/17/2017 at Unknown time  . tiZANidine (ZANAFLEX) 2 MG tablet TAKE 1 TABLET(2 MG) BY MOUTH EVERY 8 HOURS AS NEEDED FOR MUSCLE SPASMS 90 tablet 0 01/17/2017 at Unknown time  . tiZANidine (ZANAFLEX) 2 MG tablet Take 2 mg by mouth every 12 (twelve) hours.   01/17/2017 at Unknown time   Scheduled: . ALPRAZolam  1 mg Oral QID  . asenapine  20 mg Sublingual QPM  . atorvastatin  20 mg Oral QHS  . azithromycin  500 mg Intravenous Q24H  . cefTRIAXone (ROCEPHIN)  IV  1 g Intravenous Q24H  . doxepin  75 mg Oral QHS  . enoxaparin (LOVENOX) injection  40 mg Subcutaneous Q24H  . gabapentin  900 mg Oral QHS  . lamoTRIgine  300 mg Oral QHS  .  loratadine  10 mg Oral Daily  . meloxicam  7.5 mg Oral QHS  . methylPREDNISolone (SOLU-MEDROL) injection  40 mg Intravenous Q12H  . mirtazapine  45 mg Oral QHS  . mometasone-formoterol  2 puff Inhalation BID  . montelukast  10 mg Oral QPM  . perphenazine  24 mg Oral QHS  . sodium chloride flush  3 mL Intravenous Q12H  . sodium chloride flush  3 mL Intravenous Q12H   Continuous:  BXI:DHWYSH chloride, acetaminophen **OR** acetaminophen, ipratropium-albuterol, ondansetron **OR** ondansetron (ZOFRAN) IV, sodium chloride flush  Assesment: She was admitted with pneumonia. Chest x-ray shows what is read as atelectasis. I have personally reviewed this film. Clinically she has pneumonia and she will continue with treatment for that. At baseline she has pretty severe COPD and that is exacerbated by her infection. She has chronic hypoxic respiratory failure. She has bipolar disease at baseline and is on treatment from her psychiatrist. That will continue Active Problems:   Pneumonia    Plan: Continue current treatments    LOS: 1 day   Tameia Rafferty L 01/18/2017, 9:04 AM

## 2017-01-19 MED ORDER — ALPRAZOLAM 1 MG PO TABS
1.0000 mg | ORAL_TABLET | Freq: Four times a day (QID) | ORAL | Status: DC | PRN
  Administered 2017-01-19: 1 mg via ORAL
  Filled 2017-01-19: qty 1

## 2017-01-19 MED ORDER — ALUM & MAG HYDROXIDE-SIMETH 200-200-20 MG/5ML PO SUSP
15.0000 mL | Freq: Four times a day (QID) | ORAL | Status: DC | PRN
  Administered 2017-01-19 – 2017-01-20 (×3): 15 mL via ORAL
  Filled 2017-01-19 (×3): qty 30

## 2017-01-19 NOTE — Progress Notes (Signed)
Subjective: She says she feels a little better. No new complaints. She is still short of breath. She's coughing and now more productively. No chest pain nausea vomiting diarrhea.  Objective: Vital signs in last 24 hours: Temp:  [98.4 F (36.9 C)-99.1 F (37.3 C)] 98.4 F (36.9 C) (04/14 0519) Pulse Rate:  [105-112] 112 (04/14 0519) Resp:  [20] 20 (04/14 0519) BP: (104-118)/(57-77) 118/77 (04/14 0519) SpO2:  [90 %-94 %] 92 % (04/14 0740) Weight:  [99.7 kg (219 lb 11.2 oz)] 99.7 kg (219 lb 11.2 oz) (04/14 0519) Weight change: 0.907 kg (2 lb) Last BM Date: 01/15/17  Intake/Output from previous day: 04/13 0701 - 04/14 0700 In: 1020 [P.O.:720; IV Piggyback:300] Out: -   PHYSICAL EXAM General appearance: alert, cooperative and mild distress Resp: wheezes bilaterally Cardio: regular rate and rhythm, S1, S2 normal, no murmur, click, rub or gallop GI: soft, non-tender; bowel sounds normal; no masses,  no organomegaly Extremities: extremities normal, atraumatic, no cyanosis or edema Skin warm and dry. Mucous membranes are moist  Lab Results:  Results for orders placed or performed during the hospital encounter of 01/17/17 (from the past 48 hour(s))  HIV antibody (Routine Testing)     Status: None   Collection Time: 01/17/17  1:21 PM  Result Value Ref Range   HIV Screen 4th Generation wRfx Non Reactive Non Reactive    Comment: (NOTE) Performed At: [facility] [address] [place], [place] [id number] [place] William F MD Ph:[phone]   CBC     Status: Abnormal   Collection Time: 01/17/17  1:21 PM  Result Value Ref Range   WBC 11.4 (H) 4.0 - 10.5 K/uL   RBC 5.09 3.87 - 5.11 MIL/uL   Hemoglobin 15.8 (H) 12.0 - 15.0 g/dL   HCT 48.3 (H) 36.0 - 46.0 %   MCV 94.9 78.0 - 100.0 fL   MCH 31.0 26.0 - 34.0 pg   MCHC 32.7 30.0 - 36.0 g/dL   RDW 14.7 11.5 - 15.5 %   Platelets 264 150 - 400 K/uL  Creatinine, serum     Status: None   Collection Time: 01/17/17  1:21 PM  Result  Value Ref Range   Creatinine, Ser 0.75 0.44 - 1.00 mg/dL   GFR calc non Af Amer >60 >60 mL/min   GFR calc Af Amer >60 >60 mL/min    Comment: (NOTE) The eGFR has been calculated using the CKD EPI equation. This calculation has not been validated in all clinical situations. eGFR's persistently <60 mL/min signify possible Chronic Kidney Disease.   Comprehensive metabolic panel     Status: Abnormal   Collection Time: 01/17/17  1:25 PM  Result Value Ref Range   Sodium 140 135 - 145 mmol/L   Potassium 4.0 3.5 - 5.1 mmol/L   Chloride 105 101 - 111 mmol/L   CO2 28 22 - 32 mmol/L   Glucose, Bld 159 (H) 65 - 99 mg/dL   BUN 7 6 - 20 mg/dL   Creatinine, Ser 0.75 0.44 - 1.00 mg/dL   Calcium 8.8 (L) 8.9 - 10.3 mg/dL   Total Protein 6.4 (L) 6.5 - 8.1 g/dL   Albumin 3.2 (L) 3.5 - 5.0 g/dL   AST 24 15 - 41 U/L   ALT 24 14 - 54 U/L   Alkaline Phosphatase 93 38 - 126 U/L   Total Bilirubin 0.4 0.3 - 1.2 mg/dL   GFR calc non Af Amer >60 >60 mL/min   GFR calc Af Amer >60 >60 mL/min      Comment: (NOTE) The eGFR has been calculated using the CKD EPI equation. This calculation has not been validated in all clinical situations. eGFR's persistently <60 mL/min signify possible Chronic Kidney Disease.    Anion gap 7 5 - 15  CBC WITH DIFFERENTIAL     Status: Abnormal   Collection Time: 01/17/17  1:25 PM  Result Value Ref Range   WBC DUPLICATE 4.0 - 10.5 K/uL   RBC DUPLICATE 3.87 - 5.11 MIL/uL   Hemoglobin DUPLICATE 12.0 - 15.0 g/dL   HCT DUPLICATE 36.0 - 46.0 %   MCV DUPLICATE 78.0 - 100.0 fL   MCH DUPLICATE 26.0 - 34.0 pg   MCHC DUPLICATE 30.0 - 36.0 g/dL   RDW DUPLICATE 11.5 - 15.5 %   Platelets DUPLICATE 150 - 400 K/uL   Neutrophils Relative % 83 %   Neutro Abs 9.1 (H) 1.7 - 7.7 K/uL   Lymphocytes Relative 13 %   Lymphs Abs 1.5 0.7 - 4.0 K/uL   Monocytes Relative 4 %   Monocytes Absolute 0.4 0.1 - 1.0 K/uL   Eosinophils Relative 0 %   Eosinophils Absolute 0.0 0.0 - 0.7 K/uL   Basophils  Relative 0 %   Basophils Absolute 0.0 0.0 - 0.1 K/uL    ABGS No results for input(s): PHART, PO2ART, TCO2, HCO3 in the last 72 hours.  Invalid input(s): PCO2 CULTURES No results found for this or any previous visit (from the past 240 hour(s)). Studies/Results: X-ray Chest Pa And Lateral  Result Date: 01/17/2017 CLINICAL DATA:  Pneumonia EXAM: CHEST  2 VIEW COMPARISON:  Chest x-ray 12/26/2015 FINDINGS: Normal heart size and stable mediastinal contours. There is a prominent fat pad along the lower heart. Stable asymmetric thickening at the right apex which is a postoperative site. Hyperinflation with streaky lung opacities favoring atelectasis or scarring. These have progressed at the right base since prior. No Kerley lines, air bronchogram, or pneumothorax. IMPRESSION: 1. Atelectatic or scar like opacities at the bases, increased from 12/26/2015. 2. COPD. 3. Unchanged thickening at the postoperative right apex. Electronically Signed   By: Jonathon  Watts M.D.   On: 01/17/2017 15:11    Medications:  Prior to Admission:  Prescriptions Prior to Admission  Medication Sig Dispense Refill Last Dose  . ALPRAZolam (XANAX) 1 MG tablet Take 1 mg by mouth 4 (four) times daily.  3 01/17/2017 at Unknown time  . atenolol (TENORMIN) 25 MG tablet Take 1 tablet by mouth daily.   01/17/2017 at 0800  . atorvastatin (LIPITOR) 20 MG tablet Take 20 mg by mouth at bedtime.  1 01/16/2017 at Unknown time  . cetirizine (ZYRTEC) 10 MG tablet Take 10 mg by mouth daily.   01/17/2017 at Unknown time  . doxepin (SINEQUAN) 75 MG capsule TK 1 C PO QHS  1 01/16/2017 at Unknown time  . DULERA 200-5 MCG/ACT AERO INL 2 PFS PO BID  12 01/17/2017 at Unknown time  . gabapentin (NEURONTIN) 300 MG capsule Take 900 mg by mouth at bedtime.  2 01/16/2017 at Unknown time  . ipratropium-albuterol (DUONEB) 0.5-2.5 (3) MG/3ML SOLN Take 3 mLs by nebulization every 6 (six) hours as needed (shortness of breath).    01/17/2017 at Unknown time  .  lamoTRIgine (LAMICTAL) 150 MG tablet Take 300 mg by mouth at bedtime.  3 01/16/2017 at Unknown time  . levofloxacin (LEVAQUIN) 750 MG tablet Take 1 tablet by mouth daily.   01/17/2017 at Unknown time  . lidocaine (XYLOCAINE) 5 % ointment APPLY TO AFFECTED AREA TID   PRN  1 unknown  . meloxicam (MOBIC) 15 MG tablet TAKE 1 TABLET(15 MG) BY MOUTH DAILY 30 tablet 2 01/16/2017 at Unknown time  . mirtazapine (REMERON) 45 MG tablet Take 45 mg by mouth at bedtime.  3 01/16/2017 at Unknown time  . montelukast (SINGULAIR) 10 MG tablet Take 10 mg by mouth every evening.  2 01/16/2017 at Unknown time  . pantoprazole (PROTONIX) 40 MG tablet Take 1 tablet by mouth at bedtime.   01/16/2017 at Unknown time  . perphenazine (TRILAFON) 8 MG tablet Take 32 mg by mouth at bedtime.   3 01/16/2017 at Unknown time  . predniSONE (DELTASONE) 10 MG tablet Take 10 mg by mouth daily.   5 01/16/2017 at Unknown time  . PROAIR HFA 108 (90 Base) MCG/ACT inhaler Inhale 2 puffs into the lungs every 4 (four) hours as needed. Shortness of breath  2 01/17/2017 at Unknown time  . SAPHRIS 10 MG SUBL Place 20 mg under the tongue every evening.   3 01/16/2017 at Unknown time  . SYMBICORT 160-4.5 MCG/ACT inhaler Inhale 2 puffs into the lungs 2 (two) times daily.  2 01/17/2017 at Unknown time  . tiZANidine (ZANAFLEX) 2 MG tablet TAKE 1 TABLET(2 MG) BY MOUTH EVERY 8 HOURS AS NEEDED FOR MUSCLE SPASMS 90 tablet 0 01/17/2017 at Unknown time  . tiZANidine (ZANAFLEX) 2 MG tablet Take 2 mg by mouth every 12 (twelve) hours.   01/17/2017 at Unknown time   Scheduled: . ALPRAZolam  1 mg Oral QID  . asenapine  20 mg Sublingual QPM  . atorvastatin  20 mg Oral QHS  . azithromycin  500 mg Intravenous Q24H  . cefTRIAXone (ROCEPHIN)  IV  1 g Intravenous Q24H  . doxepin  75 mg Oral QHS  . enoxaparin (LOVENOX) injection  40 mg Subcutaneous Q24H  . gabapentin  900 mg Oral QHS  . guaiFENesin  1,200 mg Oral BID  . lamoTRIgine  300 mg Oral QHS  . loratadine  10 mg Oral  Daily  . meloxicam  7.5 mg Oral QHS  . methylPREDNISolone (SOLU-MEDROL) injection  40 mg Intravenous Q12H  . mirtazapine  45 mg Oral QHS  . mometasone-formoterol  2 puff Inhalation BID  . montelukast  10 mg Oral QPM  . perphenazine  24 mg Oral QHS  . sodium chloride flush  3 mL Intravenous Q12H  . sodium chloride flush  3 mL Intravenous Q12H   Continuous:  PRN:sodium chloride, acetaminophen **OR** acetaminophen, ipratropium-albuterol, ondansetron **OR** ondansetron (ZOFRAN) IV, sodium chloride flush  Assesment: She was admitted with pneumonia and COPD exacerbation. She is improving but still significantly wheezing and I don't think she's ready for discharge yet. Additionally she has bipolar disease which is stable. She has hypertension well controlled on current medications. She has had a previous stroke and that is stable Active Problems:   Pneumonia    Plan: Continue current treatments    LOS: 2 days   , L 01/19/2017, 10:22 AM  

## 2017-01-20 MED ORDER — CEFUROXIME AXETIL 500 MG PO TABS: 500.0000 mg | ORAL_TABLET | Freq: Two times a day (BID) | ORAL | 1 refills | Status: DC

## 2017-01-20 MED ORDER — AZITHROMYCIN 250 MG PO TABS: ORAL_TABLET | ORAL | 0 refills | Status: AC

## 2017-01-20 MED ORDER — PREDNISONE 10 MG (21) PO TBPK: ORAL_TABLET | ORAL | 1 refills | Status: DC

## 2017-01-20 NOTE — Progress Notes (Signed)
Subjective: She feels much better and wants to go home. She's not coughing much now. She is able to ambulate without any difficulty  Objective: Vital signs in last 24 hours: Temp:  [98.3 F (36.8 C)-99.3 F (37.4 C)] 98.6 F (37 C) (04/15 0708) Pulse Rate:  [84-104] 94 (04/15 0708) Resp:  [16-19] 18 (04/15 0708) BP: (120-126)/(62-65) 120/64 (04/15 0708) SpO2:  [91 %-98 %] 95 % (04/15 0734) Weight change:  Last BM Date: 01/15/17  Intake/Output from previous day: No intake/output data recorded.  PHYSICAL EXAM General appearance: alert, cooperative and no distress Resp: clear to auscultation bilaterally Cardio: regular rate and rhythm, S1, S2 normal, no murmur, click, rub or gallop GI: soft, non-tender; bowel sounds normal; no masses,  no organomegaly Extremities: extremities normal, atraumatic, no cyanosis or edema Skin warm and dry. Mucous membranes are moist  Lab Results:  No results found for this or any previous visit (from the past 48 hour(s)).  ABGS No results for input(s): PHART, PO2ART, TCO2, HCO3 in the last 72 hours.  Invalid input(s): PCO2 CULTURES No results found for this or any previous visit (from the past 240 hour(s)). Studies/Results: No results found.  Medications:  Prior to Admission:  Prescriptions Prior to Admission  Medication Sig Dispense Refill Last Dose  . ALPRAZolam (XANAX) 1 MG tablet Take 1 mg by mouth 4 (four) times daily.  3 01/17/2017 at Unknown time  . atenolol (TENORMIN) 25 MG tablet Take 1 tablet by mouth daily.   01/17/2017 at 0800  . atorvastatin (LIPITOR) 20 MG tablet Take 20 mg by mouth at bedtime.  1 01/16/2017 at Unknown time  . cetirizine (ZYRTEC) 10 MG tablet Take 10 mg by mouth daily.   01/17/2017 at Unknown time  . doxepin (SINEQUAN) 75 MG capsule TK 1 C PO QHS  1 01/16/2017 at Unknown time  . DULERA 200-5 MCG/ACT AERO INL 2 PFS PO BID  12 01/17/2017 at Unknown time  . gabapentin (NEURONTIN) 300 MG capsule Take 900 mg by mouth at  bedtime.  2 01/16/2017 at Unknown time  . ipratropium-albuterol (DUONEB) 0.5-2.5 (3) MG/3ML SOLN Take 3 mLs by nebulization every 6 (six) hours as needed (shortness of breath).    01/17/2017 at Unknown time  . lamoTRIgine (LAMICTAL) 150 MG tablet Take 300 mg by mouth at bedtime.  3 01/16/2017 at Unknown time  . levofloxacin (LEVAQUIN) 750 MG tablet Take 1 tablet by mouth daily.   01/17/2017 at Unknown time  . lidocaine (XYLOCAINE) 5 % ointment APPLY TO AFFECTED AREA TID PRN  1 unknown  . meloxicam (MOBIC) 15 MG tablet TAKE 1 TABLET(15 MG) BY MOUTH DAILY 30 tablet 2 01/16/2017 at Unknown time  . mirtazapine (REMERON) 45 MG tablet Take 45 mg by mouth at bedtime.  3 01/16/2017 at Unknown time  . montelukast (SINGULAIR) 10 MG tablet Take 10 mg by mouth every evening.  2 01/16/2017 at Unknown time  . pantoprazole (PROTONIX) 40 MG tablet Take 1 tablet by mouth at bedtime.   01/16/2017 at Unknown time  . perphenazine (TRILAFON) 8 MG tablet Take 32 mg by mouth at bedtime.   3 01/16/2017 at Unknown time  . predniSONE (DELTASONE) 10 MG tablet Take 10 mg by mouth daily.   5 01/16/2017 at Unknown time  . PROAIR HFA 108 (90 Base) MCG/ACT inhaler Inhale 2 puffs into the lungs every 4 (four) hours as needed. Shortness of breath  2 01/17/2017 at Unknown time  . SAPHRIS 10 MG SUBL Place 20 mg under  the tongue every evening.   3 01/16/2017 at Unknown time  . SYMBICORT 160-4.5 MCG/ACT inhaler Inhale 2 puffs into the lungs 2 (two) times daily.  2 01/17/2017 at Unknown time  . tiZANidine (ZANAFLEX) 2 MG tablet TAKE 1 TABLET(2 MG) BY MOUTH EVERY 8 HOURS AS NEEDED FOR MUSCLE SPASMS 90 tablet 0 01/17/2017 at Unknown time  . tiZANidine (ZANAFLEX) 2 MG tablet Take 2 mg by mouth every 12 (twelve) hours.   01/17/2017 at Unknown time   Scheduled: . asenapine  20 mg Sublingual QPM  . atorvastatin  20 mg Oral QHS  . azithromycin  500 mg Intravenous Q24H  . cefTRIAXone (ROCEPHIN)  IV  1 g Intravenous Q24H  . doxepin  75 mg Oral QHS  .  enoxaparin (LOVENOX) injection  40 mg Subcutaneous Q24H  . gabapentin  900 mg Oral QHS  . guaiFENesin  1,200 mg Oral BID  . lamoTRIgine  300 mg Oral QHS  . loratadine  10 mg Oral Daily  . meloxicam  7.5 mg Oral QHS  . methylPREDNISolone (SOLU-MEDROL) injection  40 mg Intravenous Q12H  . mirtazapine  45 mg Oral QHS  . mometasone-formoterol  2 puff Inhalation BID  . montelukast  10 mg Oral QPM  . perphenazine  24 mg Oral QHS  . sodium chloride flush  3 mL Intravenous Q12H  . sodium chloride flush  3 mL Intravenous Q12H   Continuous:  RUE:AVWUJW chloride, acetaminophen **OR** acetaminophen, ALPRAZolam, alum & mag hydroxide-simeth, ipratropium-albuterol, ondansetron **OR** ondansetron (ZOFRAN) IV, sodium chloride flush  Assesment: She was admitted with pneumonia and COPD exacerbation. She has a previous stroke. She has bipolar disease. She has hypertension. She is improving with everything and is ready for discharge Active Problems:   Personal history of stroke with current residual effects   Essential hypertension   GERD (gastroesophageal reflux disease)   Pneumonia    Plan: Greggory Stallion home today    LOS: 3 days   Kaneshia Cater L 01/20/2017, 10:00 AM

## 2017-01-20 NOTE — Progress Notes (Signed)
Physician Discharge Summary  Patient ID: Alicia Dixon MRN: [id number] DOB/AGE: June 22, 1966 51 y.o. Primary Care Physician:BAUCOM, JENNY B, PA-C Admit date: 01/17/2017 Discharge date: 01/20/2017    Discharge Diagnoses:   Active Problems:   COPD exacerbation (HCC)   Acute on chronic respiratory failure with hypoxia (HCC)   Bipolar 1 disorder, mixed, moderate (HCC)   Personal history of stroke with current residual effects   Essential hypertension   GERD (gastroesophageal reflux disease)   Pneumonia   Allergies as of 01/20/2017      Reactions   Other Anaphylaxis   Allergic to melons   Baclofen    hallucinations   Flonase  [fluticasone Propionate] Other (See Comments)   Nose swelling and nose bleeds   Gadopentetate Dimeglumine Other (See Comments)   "purple" discoloration, slight numbness to left fifth finger, "purple dots" on the top of left hand following MRI exam 07/22/13 with Magnevist contrast.  Radiologist did not feel this was related to contrast.   Lortab [hydrocodone-acetaminophen]    N/v hives   Seroquel [quetiapine Fumarate]    n/v   Sudafed [pseudoephedrine Hcl]    blisters   Trazodone And Nefazodone Nausea And Vomiting      Medication List    STOP taking these medications   levofloxacin 750 MG tablet Commonly known as:  LEVAQUIN     TAKE these medications   ALPRAZolam 1 MG tablet Commonly known as:  XANAX Take 1 mg by mouth 4 (four) times daily.   atenolol 25 MG tablet Commonly known as:  TENORMIN Take 1 tablet by mouth daily.   atorvastatin 20 MG tablet Commonly known as:  LIPITOR Take 20 mg by mouth at bedtime.   azithromycin 250 MG tablet Commonly known as:  ZITHROMAX Z-PAK Take 2 tablets (500 mg) on  Day 1,  followed by 1 tablet (250 mg) once daily on Days 2 through 5.   cefUROXime 500 MG tablet Commonly known as:  CEFTIN Take 1 tablet (500 mg total) by mouth 2 (two) times daily with a meal.   cetirizine 10 MG tablet Commonly known as:   ZYRTEC Take 10 mg by mouth daily.   doxepin 75 MG capsule Commonly known as:  SINEQUAN TK 1 C PO QHS   DULERA 200-5 MCG/ACT Aero Generic drug:  mometasone-formoterol INL 2 PFS PO BID   gabapentin 300 MG capsule Commonly known as:  NEURONTIN Take 900 mg by mouth at bedtime.   ipratropium-albuterol 0.5-2.5 (3) MG/3ML Soln Commonly known as:  DUONEB Take 3 mLs by nebulization every 6 (six) hours as needed (shortness of breath).   lamoTRIgine 150 MG tablet Commonly known as:  LAMICTAL Take 300 mg by mouth at bedtime.   lidocaine 5 % ointment Commonly known as:  XYLOCAINE APPLY TO AFFECTED AREA TID PRN   meloxicam 15 MG tablet Commonly known as:  MOBIC TAKE 1 TABLET(15 MG) BY MOUTH DAILY   mirtazapine 45 MG tablet Commonly known as:  REMERON Take 45 mg by mouth at bedtime.   montelukast 10 MG tablet Commonly known as:  SINGULAIR Take 10 mg by mouth every evening.   pantoprazole 40 MG tablet Commonly known as:  PROTONIX Take 1 tablet by mouth at bedtime.   perphenazine 8 MG tablet Commonly known as:  TRILAFON Take 32 mg by mouth at bedtime.   predniSONE 10 MG tablet Commonly known as:  DELTASONE Take 10 mg by mouth daily. What changed:  Another medication with the same name was added. Make sure you  understand how and when to take each.   predniSONE 10 MG (21) Tbpk tablet Commonly known as:  STERAPRED UNI-PAK 21 TAB Take by package instructions okay to continue the routine 10 mg prednisone What changed:  You were already taking a medication with the same name, and this prescription was added. Make sure you understand how and when to take each.   PROAIR HFA 108 (90 Base) MCG/ACT inhaler Generic drug:  albuterol Inhale 2 puffs into the lungs every 4 (four) hours as needed. Shortness of breath   SAPHRIS 10 MG Subl Generic drug:  Asenapine Maleate Place 20 mg under the tongue every evening.   SYMBICORT 160-4.5 MCG/ACT inhaler Generic drug:   budesonide-formoterol Inhale 2 puffs into the lungs 2 (two) times daily.   tiZANidine 2 MG tablet Commonly known as:  ZANAFLEX Take 2 mg by mouth every 12 (twelve) hours.   tiZANidine 2 MG tablet Commonly known as:  ZANAFLEX TAKE 1 TABLET(2 MG) BY MOUTH EVERY 8 HOURS AS NEEDED FOR MUSCLE SPASMS       Discharged Condition:discharge    Consults: None  Significant Diagnostic Studies: X-ray Chest Pa And Lateral  Result Date: 01/17/2017 CLINICAL DATA:  Pneumonia EXAM: CHEST  2 VIEW COMPARISON:  Chest x-ray 12/26/2015 FINDINGS: Normal heart size and stable mediastinal contours. There is a prominent fat pad along the lower heart. Stable asymmetric thickening at the right apex which is a postoperative site. Hyperinflation with streaky lung opacities favoring atelectasis or scarring. These have progressed at the right base since prior. No Kerley lines, air bronchogram, or pneumothorax. IMPRESSION: 1. Atelectatic or scar like opacities at the bases, increased from 12/26/2015. 2. COPD. 3. Unchanged thickening at the postoperative right apex. Electronically Signed   By: Marnee Spring M.D.   On: 01/17/2017 15:11    Lab Results: Basic Metabolic Panel:  Recent Labs  16/10/96 1321 01/17/17 1325  NA  --  140  K  --  4.0  CL  --  105  CO2  --  28  GLUCOSE  --  159*  BUN  --  7  CREATININE 0.75 0.75  CALCIUM  --  8.8*   Liver Function Tests:  Recent Labs  01/17/17 1325  AST 24  ALT 24  ALKPHOS 93  BILITOT 0.4  PROT 6.4*  ALBUMIN 3.2*     CBC:  Recent Labs  01/17/17 1321 01/17/17 1325  WBC 11.4* DUPLICATE  NEUTROABS  --  9.1*  HGB 15.8* DUPLICATE  HCT 48.3* DUPLICATE  MCV 94.9 DUPLICATE  PLT 264 DUPLICATE    No results found for this or any previous visit (from the past 240 hour(s)).   Hospital Course: She was admitted for IV treatment. She had pneumonia as an outpatient and was not clearing on oral meds. She was started on IV medications and improved over the  next 72 hours. She was ready for discharge and felt much better. Her cough stopped. She was not short of breath.  Discharge Exam: Blood pressure 120/64, pulse 94, temperature 98.6 F (37 C), temperature source Oral, resp. rate 18, height  (1.727 m), weight 99.7 kg (219 lb 11.2 oz), SpO2 95 %. She is awake and alert. Chest is clear. Heart is regular.  Disposition: Home  Discharge Instructions    Diet - low sodium heart healthy    Complete by:  As directed    Increase activity slowly    Complete by:  As directed  Signed: Damilola Flamm L   01/20/2017, 10:35 AM

## 2017-01-20 NOTE — Progress Notes (Signed)
Pt IV removed per NT, pt tolerated well.  Reviewed discharge instructions with pt and family at bedside and answered all questions at this time.

## 2017-01-20 NOTE — Discharge Summary (Signed)
 Hide copied text Hover for attribution information Physician Discharge Summary  Patient ID: Alicia Dixon MRN: [id number] DOB/AGE: July 29, 1966 51 y.o. Primary Care Physician:BAUCOM, JENNY B, PA-C Admit date: 01/17/2017 Discharge date: 01/20/2017    Discharge Diagnoses:   Active Problems:   COPD exacerbation (HCC)   Acute on chronic respiratory failure with hypoxia (HCC)   Bipolar 1 disorder, mixed, moderate (HCC)   Personal history of stroke with current residual effects   Essential hypertension   GERD (gastroesophageal reflux disease)   Pneumonia       Allergies as of 01/20/2017      Reactions   Other Anaphylaxis   Allergic to melons   Baclofen    hallucinations   Flonase  [fluticasone Propionate] Other (See Comments)   Nose swelling and nose bleeds   Gadopentetate Dimeglumine Other (See Comments)   "purple" discoloration, slight numbness to left fifth finger, "purple dots" on the top of left hand following MRI exam 07/22/13 with Magnevist contrast.  Radiologist did not feel this was related to contrast.   Lortab [hydrocodone-acetaminophen]    N/v hives   Seroquel [quetiapine Fumarate]    n/v   Sudafed [pseudoephedrine Hcl]    blisters   Trazodone And Nefazodone Nausea And Vomiting         Medication List    STOP taking these medications   levofloxacin 750 MG tablet Commonly known as:  LEVAQUIN     TAKE these medications   ALPRAZolam 1 MG tablet Commonly known as:  XANAX Take 1 mg by mouth 4 (four) times daily.   atenolol 25 MG tablet Commonly known as:  TENORMIN Take 1 tablet by mouth daily.   atorvastatin 20 MG tablet Commonly known as:  LIPITOR Take 20 mg by mouth at bedtime.   azithromycin 250 MG tablet Commonly known as:  ZITHROMAX Z-PAK Take 2 tablets (500 mg) on  Day 1,  followed by 1 tablet (250 mg) once daily on Days 2 through 5.   cefUROXime 500 MG tablet Commonly known as:  CEFTIN Take 1 tablet  (500 mg total) by mouth 2 (two) times daily with a meal.   cetirizine 10 MG tablet Commonly known as:  ZYRTEC Take 10 mg by mouth daily.   doxepin 75 MG capsule Commonly known as:  SINEQUAN TK 1 C PO QHS   DULERA 200-5 MCG/ACT Aero Generic drug:  mometasone-formoterol INL 2 PFS PO BID   gabapentin 300 MG capsule Commonly known as:  NEURONTIN Take 900 mg by mouth at bedtime.   ipratropium-albuterol 0.5-2.5 (3) MG/3ML Soln Commonly known as:  DUONEB Take 3 mLs by nebulization every 6 (six) hours as needed (shortness of breath).   lamoTRIgine 150 MG tablet Commonly known as:  LAMICTAL Take 300 mg by mouth at bedtime.   lidocaine 5 % ointment Commonly known as:  XYLOCAINE APPLY TO AFFECTED AREA TID PRN   meloxicam 15 MG tablet Commonly known as:  MOBIC TAKE 1 TABLET(15 MG) BY MOUTH DAILY   mirtazapine 45 MG tablet Commonly known as:  REMERON Take 45 mg by mouth at bedtime.   montelukast 10 MG tablet Commonly known as:  SINGULAIR Take 10 mg by mouth every evening.   pantoprazole 40 MG tablet Commonly known as:  PROTONIX Take 1 tablet by mouth at bedtime.   perphenazine 8 MG tablet Commonly known as:  TRILAFON Take 32 mg by mouth at bedtime.   predniSONE 10 MG tablet Commonly known as:  DELTASONE Take 10 mg by mouth daily.  What changed:  Another medication with the same name was added. Make sure you understand how and when to take each.   predniSONE 10 MG (21) Tbpk tablet Commonly known as:  STERAPRED UNI-PAK 21 TAB Take by package instructions okay to continue the routine 10 mg prednisone What changed:  You were already taking a medication with the same name, and this prescription was added. Make sure you understand how and when to take each.   PROAIR HFA 108 (90 Base) MCG/ACT inhaler Generic drug:  albuterol Inhale 2 puffs into the lungs every 4 (four) hours as needed. Shortness of breath   SAPHRIS 10 MG Subl Generic drug:  Asenapine  Maleate Place 20 mg under the tongue every evening.   SYMBICORT 160-4.5 MCG/ACT inhaler Generic drug:  budesonide-formoterol Inhale 2 puffs into the lungs 2 (two) times daily.   tiZANidine 2 MG tablet Commonly known as:  ZANAFLEX Take 2 mg by mouth every 12 (twelve) hours.   tiZANidine 2 MG tablet Commonly known as:  ZANAFLEX TAKE 1 TABLET(2 MG) BY MOUTH EVERY 8 HOURS AS NEEDED FOR MUSCLE SPASMS       Discharged Condition:discharge    Consults: None  Significant Diagnostic Studies:  Imaging Results  X-ray Chest Pa And Lateral  Result Date: 01/17/2017 CLINICAL DATA:  Pneumonia EXAM: CHEST  2 VIEW COMPARISON:  Chest x-ray 12/26/2015 FINDINGS: Normal heart size and stable mediastinal contours. There is a prominent fat pad along the lower heart. Stable asymmetric thickening at the right apex which is a postoperative site. Hyperinflation with streaky lung opacities favoring atelectasis or scarring. These have progressed at the right base since prior. No Kerley lines, air bronchogram, or pneumothorax. IMPRESSION: 1. Atelectatic or scar like opacities at the bases, increased from 12/26/2015. 2. COPD. 3. Unchanged thickening at the postoperative right apex. Electronically Signed   By: Marnee Spring M.D.   On: 01/17/2017 15:11     Lab Results: Basic Metabolic Panel:  Recent Labs (last 2 labs)    Recent Labs  01/17/17 1321 01/17/17 1325  NA  --  140  K  --  4.0  CL  --  105  CO2  --  28  GLUCOSE  --  159*  BUN  --  7  CREATININE 0.75 0.75  CALCIUM  --  8.8*     Liver Function Tests:  Recent Labs (last 2 labs)    Recent Labs  01/17/17 1325  AST 24  ALT 24  ALKPHOS 93  BILITOT 0.4  PROT 6.4*  ALBUMIN 3.2*       CBC:  Recent Labs (last 2 labs)    Recent Labs  01/17/17 1321 01/17/17 1325  WBC 11.4* DUPLICATE  NEUTROABS  --  9.1*  HGB 15.8* DUPLICATE  HCT 48.3* DUPLICATE  MCV 94.9 DUPLICATE  PLT 264 DUPLICATE      No  results found for this or any previous visit (from the past 240 hour(s)).   Hospital Course: She was admitted for IV treatment. She had pneumonia as an outpatient and was not clearing on oral meds. She was started on IV medications and improved over the next 72 hours. She was ready for discharge and felt much better. Her cough stopped. She was not short of breath.  Discharge Exam: Blood pressure 120/64, pulse 94, temperature 98.6 F (37 C), temperature source Oral, resp. rate 18, height  (1.727 m), weight 99.7 kg (219 lb 11.2 oz), SpO2 95 %. She is awake and alert. Chest is  clear. Heart is regular.  Disposition: Home      Discharge Instructions    Diet - low sodium heart healthy    Complete by:  As directed    Increase activity slowly    Complete by:  As directed        Signed: Tailyn Hantz L   01/20/2017, 10:35 AM

## 2017-01-23 ENCOUNTER — Other Ambulatory Visit: Payer: Self-pay | Admitting: Physical Medicine & Rehabilitation

## 2017-01-24 ENCOUNTER — Other Ambulatory Visit (HOSPITAL_COMMUNITY): Payer: Self-pay | Admitting: Physician Assistant

## 2017-01-24 DIAGNOSIS — Z1231 Encounter for screening mammogram for malignant neoplasm of breast: Secondary | ICD-10-CM

## 2017-02-04 ENCOUNTER — Ambulatory Visit (HOSPITAL_COMMUNITY): Payer: Medicare Other

## 2017-02-07 ENCOUNTER — Ambulatory Visit (HOSPITAL_COMMUNITY)
Admission: RE | Admit: 2017-02-07 | Discharge: 2017-02-07 | Disposition: A | Discharge status: Home / Self Care | Payer: Medicare Other | Source: Ambulatory Visit | Attending: Physician Assistant | Admitting: Physician Assistant

## 2017-02-07 DIAGNOSIS — Z1231 Encounter for screening mammogram for malignant neoplasm of breast: Secondary | ICD-10-CM | POA: Diagnosis not present

## 2017-02-08 ENCOUNTER — Encounter: Payer: Self-pay | Admitting: Physical Medicine & Rehabilitation

## 2017-02-08 ENCOUNTER — Encounter: Payer: Medicare Other | Attending: Physical Medicine & Rehabilitation | Admitting: Physical Medicine & Rehabilitation

## 2017-02-08 VITALS — BP 87/64 | HR 88

## 2017-02-08 DIAGNOSIS — J439 Emphysema, unspecified: Secondary | ICD-10-CM

## 2017-02-08 DIAGNOSIS — F319 Bipolar disorder, unspecified: Secondary | ICD-10-CM | POA: Insufficient documentation

## 2017-02-08 DIAGNOSIS — I10 Essential (primary) hypertension: Secondary | ICD-10-CM | POA: Insufficient documentation

## 2017-02-08 DIAGNOSIS — R269 Unspecified abnormalities of gait and mobility: Secondary | ICD-10-CM | POA: Diagnosis not present

## 2017-02-08 DIAGNOSIS — Z9981 Dependence on supplemental oxygen: Secondary | ICD-10-CM | POA: Diagnosis not present

## 2017-02-08 DIAGNOSIS — M545 Low back pain, unspecified: Secondary | ICD-10-CM

## 2017-02-08 DIAGNOSIS — Z716 Tobacco abuse counseling: Secondary | ICD-10-CM

## 2017-02-08 DIAGNOSIS — G479 Sleep disorder, unspecified: Secondary | ICD-10-CM | POA: Diagnosis not present

## 2017-02-08 DIAGNOSIS — M791 Myalgia, unspecified site: Secondary | ICD-10-CM

## 2017-02-08 DIAGNOSIS — G8929 Other chronic pain: Secondary | ICD-10-CM | POA: Diagnosis not present

## 2017-02-08 DIAGNOSIS — Z720 Tobacco use: Secondary | ICD-10-CM | POA: Diagnosis not present

## 2017-02-08 DIAGNOSIS — J449 Chronic obstructive pulmonary disease, unspecified: Secondary | ICD-10-CM | POA: Diagnosis not present

## 2017-02-08 NOTE — Progress Notes (Addendum)
Subjective:    Patient ID: Alicia Dixon, female    DOB: [birth year], 51 y.o.   MRN: [id number]  HPI  51 y/o female with pmh of COPD on supplemental O2, HTN, coagulopathy, bipolar disorderTIA who presents for follow up for b/l low back pain R>L. Initially patient stated.  Started ~10 years ago, getting progressively worse.  No alleviating factors.  Prolonged postures exacerbate the pain.  It is sharp and dull, constant.  Non-radiating. Today, severity is 10/10.  Denies associated numbness, tingling weakness. She has tried IBU, percocet, tylenol without benefit. She was seeing a previous pain management injections, she had trigger point injection, which helped.  She has had falls because of knee instability.  Pain limits activities of daily living.    Last clinic visit 01/11/17 for trigger point injections. Since last visit was in the hospital for PNA.  She continues to take  Medications as prescribed.  She states she is cutting back, now smoking 4/day. Overall, she states she is stable and improved.  She only has 1 localized area of pain at present.  Pain Inventory Average Pain 9 Pain Right Now 5 My pain is sharp stabbing aching  In the last 24 hours, has pain interfered with the following? General activity 4 Relation with others 2 Enjoyment of life 3 What TIME of day is your pain at its worst? morning, night Sleep (in general) Poor  Pain is worse with: walking and standing Pain improves with: rest, therapy/exercise and TENS Relief from Meds: 9  Mobility walk without assistance how many minutes can you walk? 8-10 ability to climb steps?  yes do you drive?  yes transfers alone Do you have any goals in this area?  yes  Function Do you have any goals in this area?  no  Neuro/Psych trouble walking spasms depression anxiety  Prior Studies Any changes since last visit?  no  Physicians involved in your care Any changes since last visit?  no   Family History  Problem  Relation Age of Onset  . Colon cancer Maternal Aunt    Social History   Social History  . Marital status: Divorced    Spouse name: N/A  . Number of children: N/A  . Years of education: N/A   Social History Main Topics  . Smoking status: Current Every Day Smoker    Packs/day: 0.75    Types: Cigarettes  . Smokeless tobacco: Never Used  . Alcohol use No  . Drug use: No  . Sexual activity: Not Asked   Other Topics Concern  . None   Social History Narrative  . None   Past Surgical History:  Procedure Laterality Date  . ABDOMINAL HYSTERECTOMY    . APPENDECTOMY    . CESAREAN SECTION    . CHOLECYSTECTOMY    . LUNG LOBECTOMY     right lung    Past Medical History:  Diagnosis Date  . Asthma   . Bipolar disorder (HCC)   . COPD (chronic obstructive pulmonary disease) (HCC)   . Depression   . Stroke (HCC)    BP (!) 87/64   Pulse 88   SpO2 91%   Opioid Risk Score:   Fall Risk Score:  `1  Depression screen PHQ 2/9  [facility]11[facility]2017 06/07/2016 05/25/2016  Decreased Interest 1 1 2   Down, Depressed, Hopeless 1 1 0  PHQ - 2 Score 2 2 2   Altered sleeping - - 3  Tired, decreased energy - - 3  Change in appetite - - 1  Feeling bad or failure about yourself  - - 2  Trouble concentrating - - 0  Moving slowly or fidgety/restless - - 1  Suicidal thoughts - - 0  PHQ-9 Score - - 12    Review of Systems  Constitutional: Negative.   HENT: Negative.   Eyes: Negative.   Respiratory: Positive for cough, shortness of breath and wheezing.        Oxygen therapy Respiratory infections Cardiovascular: Negative.   Gastrointestinal: Negative.   Endocrine: Negative.   Genitourinary: Negative.   Musculoskeletal: Positive for gait problem.  Skin: Negative.   Allergic/Immunologic: Negative.   Hematological: Negative.   Psychiatric/Behavioral: Positive for dysphoric mood. The patient is nervous/anxious.   All other systems reviewed and are negative.      Objective:   Physical Exam  Gen: NAD. Vital signs reviewed HENT: Normocephalic, Atraumatic Eyes: EOMI. No discharge.  Cardio: RRR. No JVD. Pulm: B/l clear to auscultation.  Effort normal. +Supplemental O2. Abd: Soft, BS+ MSK:  Gait mild antalgic.   TTP sacral paraspinals.  No edema.  Neuro: Sensation intact to light touch in all LE dermatomes  Strength  4-4+/5 in all LLE myotomes     4-4+/5 in all RLE myotomes  Skin: Warm and Dry. Intact.    Assessment & Plan:  51 y/o female with pmh of COPD on supplemental O2, HTN, coagulopathy, bipolar disorder, TIA who presents for follow up of b/l low back pain R>L.   1. Mechanical R>L low back pain  She states she does not have access and a phobia to pools  Cont Heat/Cold   Cont HEP - educated on importance, completed PT  Cont TENS  Cont Mobic 15mg  daily with food, educated pt not to take other NSAIDs concomitantly   Cont Tizanidine 2mg  BID PRN  Will consider bracing in future  2. COPD  Cont On supplemental O2, 2L  Will be weary with benzo/TCA/narcotic administration  3. Sleep disturbance  Due to pain  Cont meds per Psych and Tizanidine  4. Myalgia  Trigger point injections performed  5. Tobacco abuse  Educated on continuing to cut back  6. Bipolar disorder  Managed by Psych - pt on Gabapentin qHS, Xanax, and lamictal  7. Abnormality of gait  Cont cane, encouraged complaince  Trigger point injection procedure note: Trigger Point Injection: Written consent was obtained for the patient. Trigger points were identified of sacral paraspinals. The areas were cleaned with alcohol, and each of  these trigger points were sprayed with vapocoolant spray and then injected with 1 cc of 0.5% Marcaine (x1). Needle draw back was performed. There were no complications from the procedure, and it was well tolerated.

## 2017-03-13 ENCOUNTER — Inpatient Hospital Stay (HOSPITAL_COMMUNITY): Payer: Medicare Other

## 2017-03-13 ENCOUNTER — Inpatient Hospital Stay (HOSPITAL_COMMUNITY)
Admission: AD | Admit: 2017-03-13 | Discharge: 2017-03-15 | DRG: 190 | Disposition: A | Discharge status: Home / Self Care | Payer: Medicare Other | Source: Ambulatory Visit | Attending: Pulmonary Disease | Admitting: Pulmonary Disease

## 2017-03-13 ENCOUNTER — Encounter: Payer: Medicare Other | Admitting: Physical Medicine & Rehabilitation

## 2017-03-13 DIAGNOSIS — Z7952 Long term (current) use of systemic steroids: Secondary | ICD-10-CM

## 2017-03-13 DIAGNOSIS — F17210 Nicotine dependence, cigarettes, uncomplicated: Secondary | ICD-10-CM | POA: Diagnosis present

## 2017-03-13 DIAGNOSIS — J441 Chronic obstructive pulmonary disease with (acute) exacerbation: Secondary | ICD-10-CM | POA: Diagnosis present

## 2017-03-13 DIAGNOSIS — Z791 Long term (current) use of non-steroidal anti-inflammatories (NSAID): Secondary | ICD-10-CM

## 2017-03-13 DIAGNOSIS — Z90710 Acquired absence of both cervix and uterus: Secondary | ICD-10-CM | POA: Diagnosis not present

## 2017-03-13 DIAGNOSIS — J9621 Acute and chronic respiratory failure with hypoxia: Secondary | ICD-10-CM | POA: Diagnosis present

## 2017-03-13 DIAGNOSIS — Z885 Allergy status to narcotic agent status: Secondary | ICD-10-CM | POA: Diagnosis not present

## 2017-03-13 DIAGNOSIS — I10 Essential (primary) hypertension: Secondary | ICD-10-CM | POA: Diagnosis present

## 2017-03-13 DIAGNOSIS — Z825 Family history of asthma and other chronic lower respiratory diseases: Secondary | ICD-10-CM

## 2017-03-13 DIAGNOSIS — Z9981 Dependence on supplemental oxygen: Secondary | ICD-10-CM

## 2017-03-13 DIAGNOSIS — Z79899 Other long term (current) drug therapy: Secondary | ICD-10-CM | POA: Diagnosis not present

## 2017-03-13 DIAGNOSIS — K219 Gastro-esophageal reflux disease without esophagitis: Secondary | ICD-10-CM | POA: Diagnosis present

## 2017-03-13 DIAGNOSIS — I6930 Unspecified sequelae of cerebral infarction: Secondary | ICD-10-CM

## 2017-03-13 DIAGNOSIS — Z8673 Personal history of transient ischemic attack (TIA), and cerebral infarction without residual deficits: Secondary | ICD-10-CM | POA: Diagnosis not present

## 2017-03-13 DIAGNOSIS — R0600 Dyspnea, unspecified: Secondary | ICD-10-CM

## 2017-03-13 DIAGNOSIS — F3162 Bipolar disorder, current episode mixed, moderate: Secondary | ICD-10-CM | POA: Diagnosis present

## 2017-03-13 DIAGNOSIS — Z888 Allergy status to other drugs, medicaments and biological substances status: Secondary | ICD-10-CM

## 2017-03-13 DIAGNOSIS — R0602 Shortness of breath: Secondary | ICD-10-CM | POA: Diagnosis present

## 2017-03-13 DIAGNOSIS — Z91018 Allergy to other foods: Secondary | ICD-10-CM | POA: Diagnosis not present

## 2017-03-13 LAB — COMPREHENSIVE METABOLIC PANEL
ALK PHOS: 108 U/L (ref 38–126)
ALT: 28 U/L (ref 14–54)
AST: 27 U/L (ref 15–41)
Albumin: 3.4 g/dL — ABNORMAL LOW (ref 3.5–5.0)
Anion gap: 7 (ref 5–15)
BILIRUBIN TOTAL: 0.4 mg/dL (ref 0.3–1.2)
BUN: 10 mg/dL (ref 6–20)
CHLORIDE: 105 mmol/L (ref 101–111)
CO2: 27 mmol/L (ref 22–32)
CREATININE: 0.68 mg/dL (ref 0.44–1.00)
Calcium: 8.8 mg/dL — ABNORMAL LOW (ref 8.9–10.3)
GFR calc Af Amer: >60 mL/min (ref >60)
Glucose, Bld: 118 mg/dL — ABNORMAL HIGH (ref 65–99)
Potassium: 4.2 mmol/L (ref 3.5–5.1)
Sodium: 139 mmol/L (ref 135–145)
Total Protein: 6.6 g/dL (ref 6.5–8.1)

## 2017-03-13 LAB — CBC WITH DIFFERENTIAL/PLATELET
Basophils Absolute: 0 10*3/uL (ref 0.0–0.1)
Basophils Relative: 0 %
EOS ABS: 0.1 10*3/uL (ref 0.0–0.7)
EOS PCT: 0 %
HCT: 47.1 % — ABNORMAL HIGH (ref 36.0–46.0)
Hemoglobin: 15.4 g/dL — ABNORMAL HIGH (ref 12.0–15.0)
LYMPHS ABS: 2.4 10*3/uL (ref 0.7–4.0)
Lymphocytes Relative: 17 %
MCH: 30.8 pg (ref 26.0–34.0)
MCHC: 32.7 g/dL (ref 30.0–36.0)
MCV: 94.2 fL (ref 78.0–100.0)
Monocytes Absolute: 0.9 10*3/uL (ref 0.1–1.0)
Monocytes Relative: 7 %
Neutro Abs: 10.5 10*3/uL — ABNORMAL HIGH (ref 1.7–7.7)
Neutrophils Relative %: 76 %
PLATELETS: 346 10*3/uL (ref 150–400)
RBC: 5 MIL/uL (ref 3.87–5.11)
RDW: 15.1 % (ref 11.5–15.5)
WBC: 13.9 10*3/uL — AB (ref 4.0–10.5)

## 2017-03-13 MED ORDER — ALBUTEROL SULFATE (2.5 MG/3ML) 0.083% IN NEBU: 2.5000 mg | INHALATION_SOLUTION | Freq: Four times a day (QID) | RESPIRATORY_TRACT | Status: DC | PRN

## 2017-03-13 MED ORDER — IOPAMIDOL (ISOVUE-370) INJECTION 76%
100.0000 mL | Freq: Once | INTRAVENOUS | Status: AC | PRN
  Administered 2017-03-13: 100 mL via INTRAVENOUS

## 2017-03-13 MED ORDER — PERPHENAZINE 8 MG PO TABS
32.0000 mg | ORAL_TABLET | Freq: Every day | ORAL | Status: DC
  Administered 2017-03-13 – 2017-03-14 (×2): 32 mg via ORAL
  Filled 2017-03-13 (×2): qty 2

## 2017-03-13 MED ORDER — ONDANSETRON HCL 4 MG PO TABS: 4.0000 mg | ORAL_TABLET | Freq: Four times a day (QID) | ORAL | Status: DC | PRN

## 2017-03-13 MED ORDER — IPRATROPIUM-ALBUTEROL 0.5-2.5 (3) MG/3ML IN SOLN
3.0000 mL | Freq: Four times a day (QID) | RESPIRATORY_TRACT | Status: DC
  Administered 2017-03-14 – 2017-03-15 (×6): 3 mL via RESPIRATORY_TRACT
  Filled 2017-03-13 (×6): qty 3

## 2017-03-13 MED ORDER — ASENAPINE MALEATE 5 MG SL SUBL
20.0000 mg | SUBLINGUAL_TABLET | Freq: Every evening | SUBLINGUAL | Status: DC
  Administered 2017-03-13 – 2017-03-14 (×2): 10 mg via SUBLINGUAL
  Filled 2017-03-13 (×2): qty 4

## 2017-03-13 MED ORDER — MONTELUKAST SODIUM 10 MG PO TABS
10.0000 mg | ORAL_TABLET | Freq: Every evening | ORAL | Status: DC
  Administered 2017-03-13 – 2017-03-14 (×2): 10 mg via ORAL
  Filled 2017-03-13 (×2): qty 1

## 2017-03-13 MED ORDER — ALPRAZOLAM 1 MG PO TABS
1.0000 mg | ORAL_TABLET | Freq: Four times a day (QID) | ORAL | Status: DC
Start: 2017-03-13 — End: 2017-03-15
  Administered 2017-03-13 – 2017-03-14 (×5): 1 mg via ORAL
  Filled 2017-03-13 (×6): qty 1

## 2017-03-13 MED ORDER — TIZANIDINE HCL 4 MG PO TABS
2.0000 mg | ORAL_TABLET | Freq: Three times a day (TID) | ORAL | Status: DC | PRN
  Administered 2017-03-14: 2 mg via ORAL
  Filled 2017-03-13: qty 1

## 2017-03-13 MED ORDER — ATORVASTATIN CALCIUM 20 MG PO TABS
20.0000 mg | ORAL_TABLET | Freq: Every day | ORAL | Status: DC
  Administered 2017-03-13 – 2017-03-14 (×2): 20 mg via ORAL
  Filled 2017-03-13 (×2): qty 1

## 2017-03-13 MED ORDER — DEXTROSE 5 % IV SOLN
500.0000 mg | INTRAVENOUS | Status: DC
  Administered 2017-03-13 – 2017-03-14 (×2): 500 mg via INTRAVENOUS
  Filled 2017-03-13 (×5): qty 500

## 2017-03-13 MED ORDER — ACETAMINOPHEN 650 MG RE SUPP: 650.0000 mg | Freq: Four times a day (QID) | RECTAL | Status: DC | PRN

## 2017-03-13 MED ORDER — DOXEPIN HCL 25 MG PO CAPS
75.0000 mg | ORAL_CAPSULE | Freq: Every day | ORAL | Status: DC
Start: 2017-03-13 — End: 2017-03-15
  Administered 2017-03-13 – 2017-03-14 (×2): 75 mg via ORAL
  Filled 2017-03-13 (×2): qty 3

## 2017-03-13 MED ORDER — MIRTAZAPINE 30 MG PO TABS
45.0000 mg | ORAL_TABLET | Freq: Every day | ORAL | Status: DC
  Administered 2017-03-13 – 2017-03-14 (×2): 45 mg via ORAL
  Filled 2017-03-13 (×2): qty 1

## 2017-03-13 MED ORDER — ONDANSETRON HCL 4 MG/2ML IJ SOLN: 4.0000 mg | Freq: Four times a day (QID) | INTRAMUSCULAR | Status: DC | PRN

## 2017-03-13 MED ORDER — LAMOTRIGINE 100 MG PO TABS
300.0000 mg | ORAL_TABLET | Freq: Every day | ORAL | Status: DC
  Administered 2017-03-13 – 2017-03-14 (×2): 300 mg via ORAL
  Filled 2017-03-13 (×2): qty 3

## 2017-03-13 MED ORDER — PANTOPRAZOLE SODIUM 40 MG PO TBEC
40.0000 mg | DELAYED_RELEASE_TABLET | Freq: Every day | ORAL | Status: DC
  Administered 2017-03-13 – 2017-03-14 (×2): 40 mg via ORAL
  Filled 2017-03-13 (×2): qty 1

## 2017-03-13 MED ORDER — IPRATROPIUM-ALBUTEROL 0.5-2.5 (3) MG/3ML IN SOLN
3.0000 mL | RESPIRATORY_TRACT | Status: DC
  Administered 2017-03-13: 3 mL via RESPIRATORY_TRACT
  Filled 2017-03-13: qty 3

## 2017-03-13 MED ORDER — SODIUM CHLORIDE 0.9 % IV SOLN
INTRAVENOUS | Status: DC
  Administered 2017-03-13 – 2017-03-14 (×2): via INTRAVENOUS

## 2017-03-13 MED ORDER — MOMETASONE FURO-FORMOTEROL FUM 200-5 MCG/ACT IN AERO
2.0000 | INHALATION_SPRAY | Freq: Two times a day (BID) | RESPIRATORY_TRACT | Status: DC
  Administered 2017-03-13 – 2017-03-15 (×4): 2 via RESPIRATORY_TRACT
  Filled 2017-03-13: qty 8.8

## 2017-03-13 MED ORDER — ALBUTEROL SULFATE HFA 108 (90 BASE) MCG/ACT IN AERS: 2.0000 | INHALATION_SPRAY | Freq: Four times a day (QID) | RESPIRATORY_TRACT | Status: DC | PRN

## 2017-03-13 MED ORDER — GABAPENTIN 300 MG PO CAPS
900.0000 mg | ORAL_CAPSULE | Freq: Every day | ORAL | Status: DC
  Administered 2017-03-13 – 2017-03-14 (×2): 900 mg via ORAL
  Filled 2017-03-13 (×2): qty 3

## 2017-03-13 MED ORDER — ATENOLOL 25 MG PO TABS
25.0000 mg | ORAL_TABLET | Freq: Every day | ORAL | Status: DC
  Administered 2017-03-14 – 2017-03-15 (×2): 25 mg via ORAL
  Filled 2017-03-13 (×2): qty 1

## 2017-03-13 MED ORDER — ACETAMINOPHEN 325 MG PO TABS
650.0000 mg | ORAL_TABLET | Freq: Four times a day (QID) | ORAL | Status: DC | PRN
  Administered 2017-03-14: 650 mg via ORAL
  Filled 2017-03-13: qty 2

## 2017-03-13 MED ORDER — DEXTROSE 5 % IV SOLN
1.0000 g | INTRAVENOUS | Status: DC
  Administered 2017-03-13 – 2017-03-14 (×2): 1 g via INTRAVENOUS
  Filled 2017-03-13 (×5): qty 10

## 2017-03-13 MED ORDER — LORATADINE 10 MG PO TABS
10.0000 mg | ORAL_TABLET | Freq: Every day | ORAL | Status: DC
  Administered 2017-03-14 – 2017-03-15 (×2): 10 mg via ORAL
  Filled 2017-03-13 (×2): qty 1

## 2017-03-13 MED ORDER — ENOXAPARIN SODIUM 40 MG/0.4ML ~~LOC~~ SOLN
40.0000 mg | SUBCUTANEOUS | Status: DC
  Administered 2017-03-13 – 2017-03-14 (×2): 40 mg via SUBCUTANEOUS
  Filled 2017-03-13 (×2): qty 0.4

## 2017-03-13 MED ORDER — METHYLPREDNISOLONE SODIUM SUCC 40 MG IJ SOLR
40.0000 mg | Freq: Four times a day (QID) | INTRAMUSCULAR | Status: DC
  Administered 2017-03-13 – 2017-03-15 (×8): 40 mg via INTRAVENOUS
  Filled 2017-03-13 (×8): qty 1

## 2017-03-13 NOTE — H&P (Signed)
Full note dictated. Admitted for copd exacerbation. Feels different than other episodes. Check for pulmonary embolus

## 2017-03-14 ENCOUNTER — Encounter (HOSPITAL_COMMUNITY): Payer: Self-pay

## 2017-03-14 MED ORDER — ALBUTEROL SULFATE (2.5 MG/3ML) 0.083% IN NEBU
2.5000 mg | INHALATION_SOLUTION | RESPIRATORY_TRACT | Status: DC | PRN
  Administered 2017-03-14: 2.5 mg via RESPIRATORY_TRACT

## 2017-03-14 MED ORDER — ALBUTEROL SULFATE (2.5 MG/3ML) 0.083% IN NEBU
INHALATION_SOLUTION | RESPIRATORY_TRACT | Status: AC
  Filled 2017-03-14: qty 3

## 2017-03-14 NOTE — Progress Notes (Signed)
Subjective: She says she feels a little better. She's not back to baseline and says she's about 50%. She is coughing mostly nonproductively. She has no other new complaints. No abdominal pain nausea or vomiting. Her nurse is doing okay.  Objective: Vital signs in last 24 hours: Temp:  [98 F (36.7 C)-98.7 F (37.1 C)] 98 F (36.7 C) (06/07 0544) Pulse Rate:  [85-97] 97 (06/07 0544) Resp:  [18-20] 18 (06/07 0544) BP: (110-124)/(66-81) 110/81 (06/07 0544) SpO2:  [88 %-97 %] 93 % (06/07 0835) Weight:  [99.9 kg (220 lb 3.2 oz)-100.7 kg (222 lb)] 99.9 kg (220 lb 3.2 oz) (06/07 0544) Weight change:  Last BM Date: 03/13/17  Intake/Output from previous day: 06/06 0701 - 06/07 0700 In: 1292.5 [P.O.:480; I.V.:512.5; IV Piggyback:300] Out: 1600 [Urine:1600]  PHYSICAL EXAM General appearance: alert, cooperative and mild distress Resp: rhonchi bilaterally Cardio: regular rate and rhythm, S1, S2 normal, no murmur, click, rub or gallop GI: soft, non-tender; bowel sounds normal; no masses,  no organomegaly Extremities: extremities normal, atraumatic, no cyanosis or edema Skin warm and dry  Lab Results:  Results for orders placed or performed during the hospital encounter of 03/13/17 (from the past 48 hour(s))  Comprehensive metabolic panel     Status: Abnormal   Collection Time: 03/13/17  3:52 PM  Result Value Ref Range   Sodium 139 135 - 145 mmol/L   Potassium 4.2 3.5 - 5.1 mmol/L   Chloride 105 101 - 111 mmol/L   CO2 27 22 - 32 mmol/L   Glucose, Bld 118 (H) 65 - 99 mg/dL   BUN 10 6 - 20 mg/dL   Creatinine, Ser 0.68 0.44 - 1.00 mg/dL   Calcium 8.8 (L) 8.9 - 10.3 mg/dL   Total Protein 6.6 6.5 - 8.1 g/dL   Albumin 3.4 (L) 3.5 - 5.0 g/dL   AST 27 15 - 41 U/L   ALT 28 14 - 54 U/L   Alkaline Phosphatase 108 38 - 126 U/L   Total Bilirubin 0.4 0.3 - 1.2 mg/dL   GFR calc non Af Amer >60 >60 mL/min   GFR calc Af Amer >60 >60 mL/min    Comment: (NOTE) The eGFR has been calculated using  the CKD EPI equation. This calculation has not been validated in all clinical situations. eGFR's persistently <60 mL/min signify possible Chronic Kidney Disease.    Anion gap 7 5 - 15  CBC WITH DIFFERENTIAL     Status: Abnormal   Collection Time: 03/13/17  3:52 PM  Result Value Ref Range   WBC 13.9 (H) 4.0 - 10.5 K/uL   RBC 5.00 3.87 - 5.11 MIL/uL   Hemoglobin 15.4 (H) 12.0 - 15.0 g/dL   HCT 47.1 (H) 36.0 - 46.0 %   MCV 94.2 78.0 - 100.0 fL   MCH 30.8 26.0 - 34.0 pg   MCHC 32.7 30.0 - 36.0 g/dL   RDW 15.1 11.5 - 15.5 %   Platelets 346 150 - 400 K/uL   Neutrophils Relative % 76 %   Neutro Abs 10.5 (H) 1.7 - 7.7 K/uL   Lymphocytes Relative 17 %   Lymphs Abs 2.4 0.7 - 4.0 K/uL   Monocytes Relative 7 %   Monocytes Absolute 0.9 0.1 - 1.0 K/uL   Eosinophils Relative 0 %   Eosinophils Absolute 0.1 0.0 - 0.7 K/uL   Basophils Relative 0 %   Basophils Absolute 0.0 0.0 - 0.1 K/uL    ABGS No results for input(s): PHART, PO2ART, TCO2, HCO3 in  the last 72 hours.  Invalid input(s): PCO2 CULTURES No results found for this or any previous visit (from the past 240 hour(s)). Studies/Results: Ct Angio Chest Pe W Or Wo Contrast  Result Date: 03/13/2017 CLINICAL DATA:  History of COPD, shortness of breath EXAM: CT ANGIOGRAPHY CHEST WITH CONTRAST TECHNIQUE: Multidetector CT imaging of the chest was performed using the standard protocol during bolus administration of intravenous contrast. Multiplanar CT image reconstructions and MIPs were obtained to evaluate the vascular anatomy. CONTRAST:  100 mL Isovue 370 intravenous COMPARISON:  Radiograph 01/17/2017, CT chest 04/16/2017 FINDINGS: Cardiovascular: Respiratory motion artifact limits the exam. Satisfactory opacification of the pulmonary arteries to the segmental level. No evidence of pulmonary embolism. Non aneurysmal aorta. No dissection. Borderline cardiomegaly. No pericardial effusion. Mediastinum/Nodes: Midline trachea. No significant mediastinal  adenopathy. Esophagus within normal limits. Lungs/Pleura: Hazy dependent atelectasis. No focal consolidation or pleural effusion. Moderate emphysema. Unchanged stellate density at the rate apex consistent with scarring. Stable appearance of hyperdense material at the right lung apex measuring 3.6 x 1.6 cm, possibly related to postsurgical change. Stable similar hyperdense focus along the right cardiophrenic sulcus measuring 15 mm. Upper Abdomen: Surgical clips in the gallbladder fossa. Musculoskeletal: No chest wall abnormality. No acute or significant osseous findings. Review of the MIP images confirms the above findings. IMPRESSION: 1. No definite CT evidence for acute pulmonary embolus or aortic dissection 2. Moderate emphysema with right apical scarring. Stable hyperdense material at the right lung apex and similar smaller focus at the right cardiophrenic sulcus possibly related to postsurgical change. 3. No acute infiltrates. Electronically Signed   By: Donavan Foil M.D.   On: 03/13/2017 23:47    Medications:  Prior to Admission:  Prescriptions Prior to Admission  Medication Sig Dispense Refill Last Dose  . ALPRAZolam (XANAX) 1 MG tablet Take 1 mg by mouth 4 (four) times daily.  3 03/13/2017 at Unknown time  . atenolol (TENORMIN) 25 MG tablet Take 1 tablet by mouth daily.   03/13/2017 at 0830  . atorvastatin (LIPITOR) 20 MG tablet Take 20 mg by mouth at bedtime.  1 03/12/2017 at Unknown time  . azithromycin (ZITHROMAX) 250 MG tablet Take 2 Tablets for 1 day  thenTake 1 Tablet once a day for 4 DAYS  0 03/13/2017 at Unknown time  . cetirizine (ZYRTEC) 10 MG tablet Take 10 mg by mouth daily.   03/13/2017 at Unknown time  . doxepin (SINEQUAN) 75 MG capsule take 1 capsule at bedtime  1 03/12/2017 at Unknown time  . DULERA 200-5 MCG/ACT AERO INL 2 PFS PO BID  12 03/13/2017 at Unknown time  . gabapentin (NEURONTIN) 300 MG capsule Take 900 mg by mouth at bedtime.  2 03/12/2017 at Unknown time  . ipratropium-albuterol  (DUONEB) 0.5-2.5 (3) MG/3ML SOLN Take 3 mLs by nebulization every 6 (six) hours as needed (shortness of breath).    03/13/2017 at Unknown time  . lamoTRIgine (LAMICTAL) 150 MG tablet Take 300 mg by mouth at bedtime.  3 03/12/2017 at Unknown time  . meloxicam (MOBIC) 15 MG tablet TAKE 1 TABLET(15 MG) BY MOUTH DAILY 30 tablet 2 03/13/2017 at Unknown time  . mirtazapine (REMERON) 45 MG tablet Take 45 mg by mouth at bedtime.  3 03/12/2017 at Unknown time  . montelukast (SINGULAIR) 10 MG tablet Take 10 mg by mouth every evening.  2 03/12/2017 at Unknown time  . pantoprazole (PROTONIX) 40 MG tablet Take 1 tablet by mouth at bedtime.   03/12/2017 at Unknown time  .  perphenazine (TRILAFON) 8 MG tablet Take 32 mg by mouth at bedtime.   3 03/12/2017 at Unknown time  . predniSONE (DELTASONE) 10 MG tablet Take 10 mg by mouth daily.   5 03/13/2017 at Unknown time  . PROAIR HFA 108 (90 Base) MCG/ACT inhaler Inhale 2 puffs into the lungs every 4 (four) hours as needed. Shortness of breath  2 03/13/2017 at Unknown time  . SAPHRIS 10 MG SUBL Place 20 mg under the tongue every evening.   3 03/12/2017 at Unknown time  . tiZANidine (ZANAFLEX) 2 MG tablet TAKE 1 TABLET(2 MG) BY MOUTH EVERY 8 HOURS AS NEEDED FOR MUSCLE SPASMS 90 tablet 2 03/13/2017 at Unknown time   Scheduled: . ALPRAZolam  1 mg Oral QID  . asenapine  20 mg Sublingual QPM  . atenolol  25 mg Oral Daily  . atorvastatin  20 mg Oral QHS  . doxepin  75 mg Oral QHS  . enoxaparin (LOVENOX) injection  40 mg Subcutaneous Q24H  . gabapentin  900 mg Oral QHS  . ipratropium-albuterol  3 mL Nebulization Q6H  . lamoTRIgine  300 mg Oral QHS  . loratadine  10 mg Oral Daily  . methylPREDNISolone (SOLU-MEDROL) injection  40 mg Intravenous Q6H  . mirtazapine  45 mg Oral QHS  . mometasone-formoterol  2 puff Inhalation BID  . montelukast  10 mg Oral QPM  . pantoprazole  40 mg Oral QHS  . perphenazine  32 mg Oral QHS   Continuous: . sodium chloride 50 mL/hr at 03/13/17 1645  .  azithromycin Stopped (03/13/17 1746)  . cefTRIAXone (ROCEPHIN)  IV Stopped (03/13/17 1716)   GYB:[id number] **OR** acetaminophen, albuterol, ondansetron **OR** ondansetron (ZOFRAN) IV, tiZANidine  Assesment: She has COPD exacerbation. She is better but not ready for discharge. She has bipolar disease on treatment. Her CT which I personally reviewed does not show evidence of pulmonary emboli Active Problems:   COPD exacerbation (HCC)    Plan: Continue current treatments with IV antibiotics and steroids inhaled bronchodilators.    LOS: 1 day   Alicia Dixon L 03/14/2017, 8:58 AM

## 2017-03-14 NOTE — H&P (Signed)
Alicia Dixon MRN: [id number] DOB/AGE: Dec 13, 1965 51 y.o. Primary Care Physician:Baucom, Morton StallJenny B, PA-C Admit date: 03/13/2017 Chief Complaint: Shortness of breath HPI: This is a 51 year old who came to my office with increasing cough and congestion. She had been to her primary care physician and was started on antibiotics and told that she needed to come see me as soon as possible. She says that this feels different than her usual COPD exacerbation but has a difficult time telling me how it's different. She is coughing some. She uses her oxygen full-time. She has not coughed up much sputum. No definite fever. She feels tight in her chest but no chest pain. No nausea vomiting or diarrhea. No hemoptysis. She has had a headache. No sinus complaints. She's had some diffuse aching. This is documentation of history and physical that was dictated in my office yesterday  Past Medical History:  Diagnosis Date  . Asthma   . Bipolar disorder (HCC)   . COPD (chronic obstructive pulmonary disease) (HCC)   . Depression   . Stroke [facility](HCC)    Past Surgical History:  Procedure Laterality Date  . ABDOMINAL HYSTERECTOMY    . APPENDECTOMY    . CESAREAN SECTION    . CHOLECYSTECTOMY    . LUNG LOBECTOMY     right lung         Family History  Problem Relation Age of Onset  . Colon cancer Maternal Aunt    She has a family history of COPD as well Social History:  reports that she has been smoking Cigarettes.  She has been smoking about 0.75 packs per day. She has never used smokeless tobacco. She reports that she does not drink alcohol or use drugs.   Allergies:  Allergies  Allergen Reactions  . Other Anaphylaxis    Allergic to melons  . Baclofen     hallucinations  . Flonase  [Fluticasone Propionate] Other (See Comments)    Nose swelling and nose bleeds  . Gadopentetate Dimeglumine Other (See Comments)    "purple" discoloration, slight numbness to left fifth finger, "purple dots" on the top of  left hand following MRI exam 07/22/13 with Magnevist contrast.  Radiologist did not feel this was related to contrast.  . Lortab [Hydrocodone-Acetaminophen]     N/v hives  . Seroquel [Quetiapine Fumarate]     n/v  . Sudafed [Pseudoephedrine Hcl]     blisters  . Trazodone And Nefazodone Nausea And Vomiting    Medications Prior to Admission  Medication Sig Dispense Refill  . ALPRAZolam (XANAX) 1 MG tablet Take 1 mg by mouth 4 (four) times daily.  3  . atenolol (TENORMIN) 25 MG tablet Take 1 tablet by mouth daily.    Marland Kitchen. atorvastatin (LIPITOR) 20 MG tablet Take 20 mg by mouth at bedtime.  1  . azithromycin (ZITHROMAX) 250 MG tablet Take 2 Tablets for 1 day  thenTake 1 Tablet once a day for 4 DAYS  0  . cetirizine (ZYRTEC) 10 MG tablet Take 10 mg by mouth daily.    Marland Kitchen. doxepin (SINEQUAN) 75 MG capsule take 1 capsule at bedtime  1  . DULERA 200-5 MCG/ACT AERO INL 2 PFS PO BID  12  . gabapentin (NEURONTIN) 300 MG capsule Take 900 mg by mouth at bedtime.  2  . ipratropium-albuterol (DUONEB) 0.5-2.5 (3) MG/3ML SOLN Take 3 mLs by nebulization every 6 (six) hours as needed (shortness of breath).     . lamoTRIgine (LAMICTAL) 150 MG tablet Take 300 mg by  mouth at bedtime.  3  . meloxicam (MOBIC) 15 MG tablet TAKE 1 TABLET(15 MG) BY MOUTH DAILY 30 tablet 2  . mirtazapine (REMERON) 45 MG tablet Take 45 mg by mouth at bedtime.  3  . montelukast (SINGULAIR) 10 MG tablet Take 10 mg by mouth every evening.  2  . pantoprazole (PROTONIX) 40 MG tablet Take 1 tablet by mouth at bedtime.    Marland Kitchen perphenazine (TRILAFON) 8 MG tablet Take 32 mg by mouth at bedtime.   3  . predniSONE (DELTASONE) 10 MG tablet Take 10 mg by mouth daily.   5  . PROAIR HFA 108 (90 Base) MCG/ACT inhaler Inhale 2 puffs into the lungs every 4 (four) hours as needed. Shortness of breath  2  . SAPHRIS 10 MG SUBL Place 20 mg under the tongue every evening.   3  . tiZANidine (ZANAFLEX) 2 MG tablet TAKE 1 TABLET(2 MG) BY MOUTH EVERY 8 HOURS AS  NEEDED FOR MUSCLE SPASMS 90 tablet 2       NFA:OZHYQ from the symptoms mentioned above,there are no other symptoms referable to all systems reviewed.10 point review of systems otherwise negative  Physical Exam: Blood pressure 110/81, pulse 97, temperature 98 F (36.7 C), temperature source Oral, resp. rate 18, height 5\' 8"  (1.727 m), weight 99.9 kg (220 lb 3.2 oz), SpO2 93 %. Constitutional: She is awake and alert in mild distress. Eyes: Pupils react EOMI. Ears nose mouth and throat: Her mucous membranes are moist. She is edentulous. Her hearing is grossly normal. Cardiovascular: Her heart is regular with normal heart sounds. No edema. Respiratory: Her respiratory effort is increased. Her lungs show decreased breath sounds and significant wheezing and rhonchi. Gastrointestinal: Her abdomen is soft with no masses. Skin: Warm and dry. Musculoskeletal: Normal strength. Neurological: No focal abnormalities. Psychiatric: Normal mood and affect    Recent Labs  03/13/17 1552  WBC 13.9*  NEUTROABS 10.5*  HGB 15.4*  HCT 47.1*  MCV 94.2  PLT 346    Recent Labs  03/13/17 1552  NA 139  K 4.2  CL 105  CO2 27  GLUCOSE 118*  BUN 10  CREATININE 0.68  CALCIUM 8.8*  lablast2(ast:2,ALT:2,alkphos:2,bilitot:2,prot:2,albumin:2)@    No results found for this or any previous visit (from the past 240 hour(s)).   Ct Angio Chest Pe W Or Wo Contrast  Result Date: 03/13/2017 CLINICAL DATA:  History of COPD, shortness of breath EXAM: CT ANGIOGRAPHY CHEST WITH CONTRAST TECHNIQUE: Multidetector CT imaging of the chest was performed using the standard protocol during bolus administration of intravenous contrast. Multiplanar CT image reconstructions and MIPs were obtained to evaluate the vascular anatomy. CONTRAST:  100 mL Isovue 370 intravenous COMPARISON:  Radiograph 01/17/2017, CT chest 04/16/2017 FINDINGS: Cardiovascular: Respiratory motion artifact limits the exam. Satisfactory opacification of the  pulmonary arteries to the segmental level. No evidence of pulmonary embolism. Non aneurysmal aorta. No dissection. Borderline cardiomegaly. No pericardial effusion. Mediastinum/Nodes: Midline trachea. No significant mediastinal adenopathy. Esophagus within normal limits. Lungs/Pleura: Hazy dependent atelectasis. No focal consolidation or pleural effusion. Moderate emphysema. Unchanged stellate density at the rate apex consistent with scarring. Stable appearance of hyperdense material at the right lung apex measuring 3.6 x 1.6 cm, possibly related to postsurgical change. Stable similar hyperdense focus along the right cardiophrenic sulcus measuring 15 mm. Upper Abdomen: Surgical clips in the gallbladder fossa. Musculoskeletal: No chest wall abnormality. No acute or significant osseous findings. Review of the MIP images confirms the above findings. IMPRESSION: 1. No definite CT evidence  for acute pulmonary embolus or aortic dissection 2. Moderate emphysema with right apical scarring. Stable hyperdense material at the right lung apex and similar smaller focus at the right cardiophrenic sulcus possibly related to postsurgical change. 3. No acute infiltrates. Electronically Signed   By: Jasmine Pang M.D.   On: 03/13/2017 23:47   Impression: She is admitted with COPD exacerbation. I'm going to have her do a CT because this feels different and could be pulmonary emboli. At baseline she has bipolar disease and she is on treatment for that. Active Problems:   COPD exacerbation (HCC)     Plan: Admit for IV antibiotics IV steroids inhaled bronchodilators etc.      Melvena Vink L   03/14/2017, 8:53 AM

## 2017-03-15 ENCOUNTER — Encounter (HOSPITAL_COMMUNITY): Payer: Self-pay | Admitting: *Deleted

## 2017-03-15 MED ORDER — ORAL CARE MOUTH RINSE
15.0000 mL | Freq: Two times a day (BID) | OROMUCOSAL | Status: DC
  Administered 2017-03-15: 15 mL via OROMUCOSAL

## 2017-03-15 MED ORDER — LEVOFLOXACIN 500 MG PO TABS: 500.0000 mg | ORAL_TABLET | Freq: Every day | ORAL | 0 refills | Status: DC

## 2017-03-15 MED ORDER — PREDNISONE 10 MG (21) PO TBPK: ORAL_TABLET | ORAL | 0 refills | Status: DC

## 2017-03-15 NOTE — Progress Notes (Signed)
Subjective: She says she feels much better and wants to go home. She has no new complaints. Her breathing is back to baseline.  Objective: Vital signs in last 24 hours: Temp:  [98.2 F (36.8 C)-98.7 F (37.1 C)] 98.2 F (36.8 C) (06/08 0520) Pulse Rate:  [92-100] 92 (06/08 0520) Resp:  [18] 18 (06/08 0520) BP: (103-112)/(56-69) 112/69 (06/08 0520) SpO2:  [91 %-97 %] 97 % (06/08 0520) Weight:  [100.8 kg (222 lb 4.8 oz)] 100.8 kg (222 lb 4.8 oz) (06/08 0520) Weight change: 0.136 kg (4.8 oz) Last BM Date: 03/13/17  Intake/Output from previous day: 06/07 0701 - 06/08 0700 In: 1160 [P.O.:560; I.V.:600] Out: 400 [Urine:400]  PHYSICAL EXAM General appearance: alert, cooperative and mild distress Resp: clear to auscultation bilaterally Cardio: regular rate and rhythm, S1, S2 normal, no murmur, click, rub or gallop GI: soft, non-tender; bowel sounds normal; no masses,  no organomegaly Extremities: extremities normal, atraumatic, no cyanosis or edema Skin warm and dry  Lab Results:  Results for orders placed or performed during the hospital encounter of 03/13/17 (from the past 48 hour(s))  Comprehensive metabolic panel     Status: Abnormal   Collection Time: 03/13/17  3:52 PM  Result Value Ref Range   Sodium 139 135 - 145 mmol/L   Potassium 4.2 3.5 - 5.1 mmol/L   Chloride 105 101 - 111 mmol/L   CO2 27 22 - 32 mmol/L   Glucose, Bld 118 (H) 65 - 99 mg/dL   BUN 10 6 - 20 mg/dL   Creatinine, Ser 0.68 0.44 - 1.00 mg/dL   Calcium 8.8 (L) 8.9 - 10.3 mg/dL   Total Protein 6.6 6.5 - 8.1 g/dL   Albumin 3.4 (L) 3.5 - 5.0 g/dL   AST 27 15 - 41 U/L   ALT 28 14 - 54 U/L   Alkaline Phosphatase 108 38 - 126 U/L   Total Bilirubin 0.4 0.3 - 1.2 mg/dL   GFR calc non Af Amer >60 >60 mL/min   GFR calc Af Amer >60 >60 mL/min    Comment: (NOTE) The eGFR has been calculated using the CKD EPI equation. This calculation has not been validated in all clinical situations. eGFR's persistently <60  mL/min signify possible Chronic Kidney Disease.    Anion gap 7 5 - 15  CBC WITH DIFFERENTIAL     Status: Abnormal   Collection Time: 03/13/17  3:52 PM  Result Value Ref Range   WBC 13.9 (H) 4.0 - 10.5 K/uL   RBC 5.00 3.87 - 5.11 MIL/uL   Hemoglobin 15.4 (H) 12.0 - 15.0 g/dL   HCT 47.1 (H) 36.0 - 46.0 %   MCV 94.2 78.0 - 100.0 fL   MCH 30.8 26.0 - 34.0 pg   MCHC 32.7 30.0 - 36.0 g/dL   RDW 15.1 11.5 - 15.5 %   Platelets 346 150 - 400 K/uL   Neutrophils Relative % 76 %   Neutro Abs 10.5 (H) 1.7 - 7.7 K/uL   Lymphocytes Relative 17 %   Lymphs Abs 2.4 0.7 - 4.0 K/uL   Monocytes Relative 7 %   Monocytes Absolute 0.9 0.1 - 1.0 K/uL   Eosinophils Relative 0 %   Eosinophils Absolute 0.1 0.0 - 0.7 K/uL   Basophils Relative 0 %   Basophils Absolute 0.0 0.0 - 0.1 K/uL    ABGS No results for input(s): PHART, PO2ART, TCO2, HCO3 in the last 72 hours.  Invalid input(s): PCO2 CULTURES No results found for this or any previous  visit (from the past 240 hour(s)). Studies/Results: Ct Angio Chest Pe W Or Wo Contrast  Result Date: 03/13/2017 CLINICAL DATA:  History of COPD, shortness of breath EXAM: CT ANGIOGRAPHY CHEST WITH CONTRAST TECHNIQUE: Multidetector CT imaging of the chest was performed using the standard protocol during bolus administration of intravenous contrast. Multiplanar CT image reconstructions and MIPs were obtained to evaluate the vascular anatomy. CONTRAST:  100 mL Isovue 370 intravenous COMPARISON:  Radiograph 01/17/2017, CT chest 04/16/2017 FINDINGS: Cardiovascular: Respiratory motion artifact limits the exam. Satisfactory opacification of the pulmonary arteries to the segmental level. No evidence of pulmonary embolism. Non aneurysmal aorta. No dissection. Borderline cardiomegaly. No pericardial effusion. Mediastinum/Nodes: Midline trachea. No significant mediastinal adenopathy. Esophagus within normal limits. Lungs/Pleura: Hazy dependent atelectasis. No focal consolidation or  pleural effusion. Moderate emphysema. Unchanged stellate density at the rate apex consistent with scarring. Stable appearance of hyperdense material at the right lung apex measuring 3.6 x 1.6 cm, possibly related to postsurgical change. Stable similar hyperdense focus along the right cardiophrenic sulcus measuring 15 mm. Upper Abdomen: Surgical clips in the gallbladder fossa. Musculoskeletal: No chest wall abnormality. No acute or significant osseous findings. Review of the MIP images confirms the above findings. IMPRESSION: 1. No definite CT evidence for acute pulmonary embolus or aortic dissection 2. Moderate emphysema with right apical scarring. Stable hyperdense material at the right lung apex and similar smaller focus at the right cardiophrenic sulcus possibly related to postsurgical change. 3. No acute infiltrates. Electronically Signed   By: Donavan Foil M.D.   On: 03/13/2017 23:47    Medications:  Prior to Admission:  Prescriptions Prior to Admission  Medication Sig Dispense Refill Last Dose  . ALPRAZolam (XANAX) 1 MG tablet Take 1 mg by mouth 4 (four) times daily.  3 03/13/2017 at Unknown time  . atenolol (TENORMIN) 25 MG tablet Take 1 tablet by mouth daily.   03/13/2017 at 0830  . atorvastatin (LIPITOR) 20 MG tablet Take 20 mg by mouth at bedtime.  1 03/12/2017 at Unknown time  . azithromycin (ZITHROMAX) 250 MG tablet Take 2 Tablets for 1 day  thenTake 1 Tablet once a day for 4 DAYS  0 03/13/2017 at Unknown time  . cetirizine (ZYRTEC) 10 MG tablet Take 10 mg by mouth daily.   03/13/2017 at Unknown time  . doxepin (SINEQUAN) 75 MG capsule take 1 capsule at bedtime  1 03/12/2017 at Unknown time  . DULERA 200-5 MCG/ACT AERO INL 2 PFS PO BID  12 03/13/2017 at Unknown time  . gabapentin (NEURONTIN) 300 MG capsule Take 900 mg by mouth at bedtime.  2 03/12/2017 at Unknown time  . ipratropium-albuterol (DUONEB) 0.5-2.5 (3) MG/3ML SOLN Take 3 mLs by nebulization every 6 (six) hours as needed (shortness of breath).     03/13/2017 at Unknown time  . lamoTRIgine (LAMICTAL) 150 MG tablet Take 300 mg by mouth at bedtime.  3 03/12/2017 at Unknown time  . meloxicam (MOBIC) 15 MG tablet TAKE 1 TABLET(15 MG) BY MOUTH DAILY 30 tablet 2 03/13/2017 at Unknown time  . mirtazapine (REMERON) 45 MG tablet Take 45 mg by mouth at bedtime.  3 03/12/2017 at Unknown time  . montelukast (SINGULAIR) 10 MG tablet Take 10 mg by mouth every evening.  2 03/12/2017 at Unknown time  . pantoprazole (PROTONIX) 40 MG tablet Take 1 tablet by mouth at bedtime.   03/12/2017 at Unknown time  . perphenazine (TRILAFON) 8 MG tablet Take 32 mg by mouth at bedtime.   3 03/12/2017 at  Unknown time  . predniSONE (DELTASONE) 10 MG tablet Take 10 mg by mouth daily.   5 03/13/2017 at Unknown time  . PROAIR HFA 108 (90 Base) MCG/ACT inhaler Inhale 2 puffs into the lungs every 4 (four) hours as needed. Shortness of breath  2 03/13/2017 at Unknown time  . SAPHRIS 10 MG SUBL Place 20 mg under the tongue every evening.   3 03/12/2017 at Unknown time  . tiZANidine (ZANAFLEX) 2 MG tablet TAKE 1 TABLET(2 MG) BY MOUTH EVERY 8 HOURS AS NEEDED FOR MUSCLE SPASMS 90 tablet 2 03/13/2017 at Unknown time   Scheduled: . ALPRAZolam  1 mg Oral QID  . asenapine  20 mg Sublingual QPM  . atenolol  25 mg Oral Daily  . atorvastatin  20 mg Oral QHS  . doxepin  75 mg Oral QHS  . enoxaparin (LOVENOX) injection  40 mg Subcutaneous Q24H  . gabapentin  900 mg Oral QHS  . ipratropium-albuterol  3 mL Nebulization Q6H  . lamoTRIgine  300 mg Oral QHS  . loratadine  10 mg Oral Daily  . mouth rinse  15 mL Mouth Rinse BID  . methylPREDNISolone (SOLU-MEDROL) injection  40 mg Intravenous Q6H  . mirtazapine  45 mg Oral QHS  . mometasone-formoterol  2 puff Inhalation BID  . montelukast  10 mg Oral QPM  . pantoprazole  40 mg Oral QHS  . perphenazine  32 mg Oral QHS   Continuous: . sodium chloride 50 mL/hr at 03/14/17 1131  . azithromycin Stopped (03/14/17 1845)  . cefTRIAXone (ROCEPHIN)  IV Stopped  (03/14/17 1815)   OEC:[id number] **OR** acetaminophen, albuterol, ondansetron **OR** ondansetron (ZOFRAN) IV, tiZANidine  Assesment: She was admitted with COPD exacerbation and she is substantially better. She is ready for discharge Active Problems:   COPD exacerbation ([place])    Plan: Discharge home today    LOS: 2 days   Chayil Gantt L 03/15/2017, 8:18 AM

## 2017-03-15 NOTE — Care Management Important Message (Signed)
Important Message  Patient Details  Name: Alicia Dixon MRN: [id number] Date of Birth: 02-01-1966   Medicare Important Message Given:  Yes    Malcolm MetroChildress, Lorice Lafave Demske, RN 03/15/2017, 8:38 AM

## 2017-03-15 NOTE — Discharge Summary (Signed)
Physician Discharge Summary  Patient ID: Alicia Dixon MRN: [id number] DOB/AGE: [birth year] 51 y.o. Primary Care Physician:Baucom, Shaune Pollack, PA-C Admit date: 03/13/2017 Discharge date: 03/15/2017    Discharge Diagnoses:   Active Problems:   COPD exacerbation (HCC)   Acute on chronic respiratory failure with hypoxia (HCC)   Bipolar 1 disorder, mixed, moderate (HCC)   Personal history of stroke with current residual effects   Essential hypertension   GERD (gastroesophageal reflux disease)   Allergies as of 03/15/2017      Reactions   Other Anaphylaxis   Allergic to melons   Baclofen    hallucinations   Flonase  [fluticasone Propionate] Other (See Comments)   Nose swelling and nose bleeds   Gadopentetate Dimeglumine Other (See Comments)   "purple" discoloration, slight numbness to left fifth finger, "purple dots" on the top of left hand following MRI exam 07/22/13 with Magnevist contrast.  Radiologist did not feel this was related to contrast.   Lortab [hydrocodone-acetaminophen]    N/v hives   Seroquel [quetiapine Fumarate]    n/v   Sudafed [pseudoephedrine Hcl]    blisters   Trazodone And Nefazodone Nausea And Vomiting      Medication List    TAKE these medications   ALPRAZolam 1 MG tablet Commonly known as:  XANAX Take 1 mg by mouth 4 (four) times daily.   atenolol 25 MG tablet Commonly known as:  TENORMIN Take 1 tablet by mouth daily.   atorvastatin 20 MG tablet Commonly known as:  LIPITOR Take 20 mg by mouth at bedtime.   azithromycin 250 MG tablet Commonly known as:  ZITHROMAX Take 2 Tablets for 1 day  thenTake 1 Tablet once a day for 4 DAYS   cetirizine 10 MG tablet Commonly known as:  ZYRTEC Take 10 mg by mouth daily.   doxepin 75 MG capsule Commonly known as:  SINEQUAN take 1 capsule at bedtime   DULERA 200-5 MCG/ACT Aero Generic drug:  mometasone-formoterol INL 2 PFS PO BID   gabapentin 300 MG capsule Commonly known as:  NEURONTIN Take 900 mg  by mouth at bedtime.   ipratropium-albuterol 0.5-2.5 (3) MG/3ML Soln Commonly known as:  DUONEB Take 3 mLs by nebulization every 6 (six) hours as needed (shortness of breath).   lamoTRIgine 150 MG tablet Commonly known as:  LAMICTAL Take 300 mg by mouth at bedtime.   levofloxacin 500 MG tablet Commonly known as:  LEVAQUIN Take 1 tablet (500 mg total) by mouth daily.   meloxicam 15 MG tablet Commonly known as:  MOBIC TAKE 1 TABLET(15 MG) BY MOUTH DAILY   mirtazapine 45 MG tablet Commonly known as:  REMERON Take 45 mg by mouth at bedtime.   montelukast 10 MG tablet Commonly known as:  SINGULAIR Take 10 mg by mouth every evening.   pantoprazole 40 MG tablet Commonly known as:  PROTONIX Take 1 tablet by mouth at bedtime.   perphenazine 8 MG tablet Commonly known as:  TRILAFON Take 32 mg by mouth at bedtime.   predniSONE 10 MG tablet Commonly known as:  DELTASONE Take 10 mg by mouth daily. What changed:  Another medication with the same name was added. Make sure you understand how and when to take each.   predniSONE 10 MG (21) Tbpk tablet Commonly known as:  STERAPRED UNI-PAK 21 TAB Take by package instructions What changed:  You were already taking a medication with the same name, and this prescription was added. Make sure you understand how and when to  take each.   PROAIR HFA 108 (90 Base) MCG/ACT inhaler Generic drug:  albuterol Inhale 2 puffs into the lungs every 4 (four) hours as needed. Shortness of breath   SAPHRIS 10 MG Subl Generic drug:  Asenapine Maleate Place 20 mg under the tongue every evening.   tiZANidine 2 MG tablet Commonly known as:  ZANAFLEX TAKE 1 TABLET(2 MG) BY MOUTH EVERY 8 HOURS AS NEEDED FOR MUSCLE SPASMS       Discharged Condition:Improved    Consults: None  Significant Diagnostic Studies: Ct Angio Chest Pe W Or Wo Contrast  Result Date: 03/13/2017 CLINICAL DATA:  History of COPD, shortness of breath EXAM: CT ANGIOGRAPHY CHEST  WITH CONTRAST TECHNIQUE: Multidetector CT imaging of the chest was performed using the standard protocol during bolus administration of intravenous contrast. Multiplanar CT image reconstructions and MIPs were obtained to evaluate the vascular anatomy. CONTRAST:  100 mL Isovue 370 intravenous COMPARISON:  Radiograph 01/17/2017, CT chest 04/16/2017 FINDINGS: Cardiovascular: Respiratory motion artifact limits the exam. Satisfactory opacification of the pulmonary arteries to the segmental level. No evidence of pulmonary embolism. Non aneurysmal aorta. No dissection. Borderline cardiomegaly. No pericardial effusion. Mediastinum/Nodes: Midline trachea. No significant mediastinal adenopathy. Esophagus within normal limits. Lungs/Pleura: Hazy dependent atelectasis. No focal consolidation or pleural effusion. Moderate emphysema. Unchanged stellate density at the rate apex consistent with scarring. Stable appearance of hyperdense material at the right lung apex measuring 3.6 x 1.6 cm, possibly related to postsurgical change. Stable similar hyperdense focus along the right cardiophrenic sulcus measuring 15 mm. Upper Abdomen: Surgical clips in the gallbladder fossa. Musculoskeletal: No chest wall abnormality. No acute or significant osseous findings. Review of the MIP images confirms the above findings. IMPRESSION: 1. No definite CT evidence for acute pulmonary embolus or aortic dissection 2. Moderate emphysema with right apical scarring. Stable hyperdense material at the right lung apex and similar smaller focus at the right cardiophrenic sulcus possibly related to postsurgical change. 3. No acute infiltrates. Electronically Signed   By: Jasmine Pang M.D.   On: 03/13/2017 23:47    Lab Results: Basic Metabolic Panel:  Recent Labs  16/10/96 1552  NA 139  K 4.2  CL 105  CO2 27  GLUCOSE 118*  BUN 10  CREATININE 0.68  CALCIUM 8.8*   Liver Function Tests:  Recent Labs  03/13/17 1552  AST 27  ALT 28  ALKPHOS  108  BILITOT 0.4  PROT 6.6  ALBUMIN 3.4*     CBC:  Recent Labs  03/13/17 1552  WBC 13.9*  NEUTROABS 10.5*  HGB 15.4*  HCT 47.1*  MCV 94.2  PLT 346    No results found for this or any previous visit (from the past 240 hour(s)).   Hospital Course: This is a 51 year old who came to my office with increasing shortness of breath. She had been to her primary care provider and was treated for COPD exacerbation but she did not improve. She was admitted because of COPD exacerbation that was not improving on standard treatment. She was started on IV antibiotics and steroids. She had a CT of the chest because her symptoms were not exactly like her normal COPD exacerbation and I was concerned that she might have a pulmonary embolism but this did not show on CT. By the time of discharge she felt much better and was back to baseline.  Discharge Exam: Blood pressure 112/69, pulse 92, temperature 98.2 F (36.8 C), temperature source Oral, resp. rate 18, height 5\' 8"  (1.727 m), weight 100.8  kg (222 lb 4.8 oz), SpO2 97 %. She is awake and alert. Her chest is clear. Heart is regular.  Disposition: Home she will follow-up with her primary care provider next week and in my office in about 2 weeks      Signed: Bienvenido Proehl L   03/15/2017, 8:27 AM

## 2017-03-15 NOTE — Care Management Note (Signed)
Case Management Note  Patient Details  Name: Alicia Dixon MRN: [id number] Date of Birth: [birth year]  Subjective/Objective:                  Admitted with COPD exacerbation. Pt from home, with lives family. She is ind with ADL's, has PCP, transportation and insurance with drug coverage. Pt has home oxygen and neb machine pta. No difficulty affording or managing medications, no needs communicated.   Action/Plan: Discharging home today with self care  Expected Discharge Date:  03/15/17               Expected Discharge Plan:  Home/Self Care  In-House Referral:  NA  Discharge planning Services  CM Consult  Post Acute Care Choice:  NA Choice offered to:  NA  Status of Service:  Completed, signed off   Alicia Dixon, Alicia Greff Demske, RN 03/15/2017, 8:36 AM

## 2017-03-21 ENCOUNTER — Encounter: Payer: Self-pay | Admitting: Physical Medicine & Rehabilitation

## 2017-03-21 ENCOUNTER — Encounter: Payer: Medicare Other | Attending: Physical Medicine & Rehabilitation | Admitting: Physical Medicine & Rehabilitation

## 2017-03-21 VITALS — BP 98/73 | HR 109

## 2017-03-21 DIAGNOSIS — R269 Unspecified abnormalities of gait and mobility: Secondary | ICD-10-CM | POA: Diagnosis not present

## 2017-03-21 DIAGNOSIS — J449 Chronic obstructive pulmonary disease, unspecified: Secondary | ICD-10-CM | POA: Insufficient documentation

## 2017-03-21 DIAGNOSIS — M791 Myalgia, unspecified site: Secondary | ICD-10-CM

## 2017-03-21 DIAGNOSIS — Z720 Tobacco use: Secondary | ICD-10-CM | POA: Insufficient documentation

## 2017-03-21 DIAGNOSIS — M545 Low back pain: Secondary | ICD-10-CM | POA: Insufficient documentation

## 2017-03-21 DIAGNOSIS — Z9981 Dependence on supplemental oxygen: Secondary | ICD-10-CM | POA: Diagnosis not present

## 2017-03-21 DIAGNOSIS — I10 Essential (primary) hypertension: Secondary | ICD-10-CM | POA: Diagnosis not present

## 2017-03-21 DIAGNOSIS — G479 Sleep disorder, unspecified: Secondary | ICD-10-CM | POA: Diagnosis not present

## 2017-03-21 DIAGNOSIS — F319 Bipolar disorder, unspecified: Secondary | ICD-10-CM | POA: Insufficient documentation

## 2017-03-21 NOTE — Progress Notes (Signed)
Trigger point injection procedure note: Trigger Point Injection: Written consent was obtained for the patient. Trigger points were identified of right lumbosacral paraspinals and gluteal muscles. The areas were cleaned with alcohol, and each of  these trigger points were sprayed with vapocoolant spray and then injected with 1 cc of 0.5% Marcaine (x3). Needle draw back was performed. There were no complications from the procedure, and it was well tolerated.

## 2017-04-12 ENCOUNTER — Other Ambulatory Visit: Payer: Self-pay | Admitting: Physical Medicine & Rehabilitation

## 2017-04-17 ENCOUNTER — Observation Stay (HOSPITAL_COMMUNITY): Payer: Medicare Other

## 2017-04-17 ENCOUNTER — Inpatient Hospital Stay (HOSPITAL_COMMUNITY)
Admission: AD | Admit: 2017-04-17 | Discharge: 2017-04-19 | DRG: 189 | Disposition: A | Discharge status: Home / Self Care | Payer: Medicare Other | Source: Ambulatory Visit | Attending: Pulmonary Disease | Admitting: Pulmonary Disease

## 2017-04-17 ENCOUNTER — Encounter (HOSPITAL_COMMUNITY): Payer: Self-pay

## 2017-04-17 DIAGNOSIS — K219 Gastro-esophageal reflux disease without esophagitis: Secondary | ICD-10-CM | POA: Diagnosis present

## 2017-04-17 DIAGNOSIS — I10 Essential (primary) hypertension: Secondary | ICD-10-CM | POA: Diagnosis present

## 2017-04-17 DIAGNOSIS — Z90710 Acquired absence of both cervix and uterus: Secondary | ICD-10-CM

## 2017-04-17 DIAGNOSIS — F17210 Nicotine dependence, cigarettes, uncomplicated: Secondary | ICD-10-CM | POA: Diagnosis present

## 2017-04-17 DIAGNOSIS — J9621 Acute and chronic respiratory failure with hypoxia: Secondary | ICD-10-CM | POA: Diagnosis present

## 2017-04-17 DIAGNOSIS — Z800 Family history of malignant neoplasm of digestive organs: Secondary | ICD-10-CM

## 2017-04-17 DIAGNOSIS — Z902 Acquired absence of lung [part of]: Secondary | ICD-10-CM

## 2017-04-17 DIAGNOSIS — I6930 Unspecified sequelae of cerebral infarction: Secondary | ICD-10-CM

## 2017-04-17 DIAGNOSIS — Z79899 Other long term (current) drug therapy: Secondary | ICD-10-CM

## 2017-04-17 DIAGNOSIS — J441 Chronic obstructive pulmonary disease with (acute) exacerbation: Secondary | ICD-10-CM | POA: Diagnosis present

## 2017-04-17 DIAGNOSIS — Z888 Allergy status to other drugs, medicaments and biological substances status: Secondary | ICD-10-CM

## 2017-04-17 DIAGNOSIS — F3162 Bipolar disorder, current episode mixed, moderate: Secondary | ICD-10-CM | POA: Diagnosis present

## 2017-04-17 DIAGNOSIS — Z9049 Acquired absence of other specified parts of digestive tract: Secondary | ICD-10-CM

## 2017-04-17 DIAGNOSIS — Z8673 Personal history of transient ischemic attack (TIA), and cerebral infarction without residual deficits: Secondary | ICD-10-CM

## 2017-04-17 LAB — COMPREHENSIVE METABOLIC PANEL
ALK PHOS: 117 U/L (ref 38–126)
ALT: 28 U/L (ref 14–54)
ANION GAP: 11 (ref 5–15)
AST: 28 U/L (ref 15–41)
Albumin: 3.5 g/dL (ref 3.5–5.0)
BUN: 8 mg/dL (ref 6–20)
CO2: 28 mmol/L (ref 22–32)
Calcium: 9.4 mg/dL (ref 8.9–10.3)
Chloride: 103 mmol/L (ref 101–111)
Creatinine, Ser: 0.67 mg/dL (ref 0.44–1.00)
Glucose, Bld: 133 mg/dL — ABNORMAL HIGH (ref 65–99)
Potassium: 3.8 mmol/L (ref 3.5–5.1)
SODIUM: 142 mmol/L (ref 135–145)
Total Bilirubin: 0.6 mg/dL (ref 0.3–1.2)
Total Protein: 7.2 g/dL (ref 6.5–8.1)

## 2017-04-17 LAB — CBC WITH DIFFERENTIAL/PLATELET
BASOS ABS: 0 10*3/uL (ref 0.0–0.1)
BASOS PCT: 0 %
EOS ABS: 0.1 10*3/uL (ref 0.0–0.7)
Eosinophils Relative: 0 %
HEMATOCRIT: 49.5 % — AB (ref 36.0–46.0)
HEMOGLOBIN: 16.3 g/dL — AB (ref 12.0–15.0)
Lymphocytes Relative: 22 %
Lymphs Abs: 2.8 10*3/uL (ref 0.7–4.0)
MCH: 31.3 pg (ref 26.0–34.0)
MCHC: 32.9 g/dL (ref 30.0–36.0)
MCV: 95.2 fL (ref 78.0–100.0)
Monocytes Absolute: 0.7 10*3/uL (ref 0.1–1.0)
Monocytes Relative: 5 %
NEUTROS ABS: 9.1 10*3/uL — AB (ref 1.7–7.7)
NEUTROS PCT: 73 %
Platelets: 327 10*3/uL (ref 150–400)
RBC: 5.2 MIL/uL — AB (ref 3.87–5.11)
RDW: 15 % (ref 11.5–15.5)
WBC: 12.6 10*3/uL — AB (ref 4.0–10.5)

## 2017-04-17 MED ORDER — DOXYCYCLINE HYCLATE 100 MG IV SOLR
100.0000 mg | Freq: Two times a day (BID) | INTRAVENOUS | Status: DC
  Administered 2017-04-17 – 2017-04-18 (×3): 100 mg via INTRAVENOUS
  Filled 2017-04-17 (×10): qty 100

## 2017-04-17 MED ORDER — PANTOPRAZOLE SODIUM 40 MG PO TBEC
40.0000 mg | DELAYED_RELEASE_TABLET | Freq: Every day | ORAL | Status: DC
  Administered 2017-04-17 – 2017-04-18 (×2): 40 mg via ORAL
  Filled 2017-04-17 (×2): qty 1

## 2017-04-17 MED ORDER — METHYLPREDNISOLONE SODIUM SUCC 125 MG IJ SOLR
60.0000 mg | Freq: Four times a day (QID) | INTRAMUSCULAR | Status: DC
  Administered 2017-04-17 – 2017-04-19 (×7): 60 mg via INTRAVENOUS
  Filled 2017-04-17 (×7): qty 2

## 2017-04-17 MED ORDER — SODIUM CHLORIDE 0.9 % IV SOLN: 250.0000 mL | INTRAVENOUS | Status: DC | PRN

## 2017-04-17 MED ORDER — GUAIFENESIN ER 600 MG PO TB12
600.0000 mg | ORAL_TABLET | Freq: Two times a day (BID) | ORAL | Status: DC
  Administered 2017-04-17 – 2017-04-19 (×4): 600 mg via ORAL
  Filled 2017-04-17 (×4): qty 1

## 2017-04-17 MED ORDER — MIRTAZAPINE 30 MG PO TABS
45.0000 mg | ORAL_TABLET | Freq: Every day | ORAL | Status: DC
  Administered 2017-04-17 – 2017-04-18 (×2): 45 mg via ORAL
  Filled 2017-04-17 (×2): qty 1

## 2017-04-17 MED ORDER — MONTELUKAST SODIUM 10 MG PO TABS
10.0000 mg | ORAL_TABLET | Freq: Every evening | ORAL | Status: DC
  Administered 2017-04-17 – 2017-04-18 (×2): 10 mg via ORAL
  Filled 2017-04-17 (×2): qty 1

## 2017-04-17 MED ORDER — ACETAMINOPHEN 650 MG RE SUPP: 650.0000 mg | Freq: Four times a day (QID) | RECTAL | Status: DC | PRN

## 2017-04-17 MED ORDER — ATORVASTATIN CALCIUM 20 MG PO TABS
20.0000 mg | ORAL_TABLET | Freq: Every day | ORAL | Status: DC
  Administered 2017-04-17 – 2017-04-18 (×2): 20 mg via ORAL
  Filled 2017-04-17 (×2): qty 1

## 2017-04-17 MED ORDER — ALBUTEROL SULFATE (2.5 MG/3ML) 0.083% IN NEBU: 3.0000 mL | INHALATION_SOLUTION | RESPIRATORY_TRACT | Status: DC | PRN

## 2017-04-17 MED ORDER — DOXEPIN HCL 25 MG PO CAPS
75.0000 mg | ORAL_CAPSULE | Freq: Every day | ORAL | Status: DC
  Administered 2017-04-17 – 2017-04-18 (×2): 75 mg via ORAL
  Filled 2017-04-17 (×2): qty 3

## 2017-04-17 MED ORDER — ASENAPINE MALEATE 5 MG SL SUBL
20.0000 mg | SUBLINGUAL_TABLET | Freq: Every evening | SUBLINGUAL | Status: DC
  Administered 2017-04-17 – 2017-04-18 (×2): 20 mg via SUBLINGUAL
  Filled 2017-04-17 (×2): qty 2

## 2017-04-17 MED ORDER — ONDANSETRON HCL 4 MG/2ML IJ SOLN: 4.0000 mg | Freq: Four times a day (QID) | INTRAMUSCULAR | Status: DC | PRN

## 2017-04-17 MED ORDER — TIZANIDINE HCL 4 MG PO TABS: 2.0000 mg | ORAL_TABLET | Freq: Three times a day (TID) | ORAL | Status: DC | PRN

## 2017-04-17 MED ORDER — PERPHENAZINE 8 MG PO TABS
32.0000 mg | ORAL_TABLET | Freq: Every day | ORAL | Status: DC
  Administered 2017-04-17 – 2017-04-18 (×2): 32 mg via ORAL
  Filled 2017-04-17 (×2): qty 2

## 2017-04-17 MED ORDER — ALPRAZOLAM 1 MG PO TABS
1.0000 mg | ORAL_TABLET | Freq: Four times a day (QID) | ORAL | Status: DC
  Administered 2017-04-17 – 2017-04-18 (×2): 1 mg via ORAL
  Filled 2017-04-17 (×5): qty 1

## 2017-04-17 MED ORDER — MOMETASONE FURO-FORMOTEROL FUM 200-5 MCG/ACT IN AERO
2.0000 | INHALATION_SPRAY | Freq: Two times a day (BID) | RESPIRATORY_TRACT | Status: DC
  Administered 2017-04-17 – 2017-04-19 (×4): 2 via RESPIRATORY_TRACT
  Filled 2017-04-17: qty 8.8

## 2017-04-17 MED ORDER — LORATADINE 10 MG PO TABS
10.0000 mg | ORAL_TABLET | Freq: Every day | ORAL | Status: DC
  Administered 2017-04-17 – 2017-04-19 (×3): 10 mg via ORAL
  Filled 2017-04-17 (×3): qty 1

## 2017-04-17 MED ORDER — PROPRANOLOL HCL 20 MG PO TABS
10.0000 mg | ORAL_TABLET | Freq: Two times a day (BID) | ORAL | Status: DC
  Administered 2017-04-18 – 2017-04-19 (×2): 10 mg via ORAL
  Filled 2017-04-17 (×3): qty 1

## 2017-04-17 MED ORDER — SODIUM CHLORIDE 0.9% FLUSH: 3.0000 mL | INTRAVENOUS | Status: DC | PRN

## 2017-04-17 MED ORDER — ENOXAPARIN SODIUM 40 MG/0.4ML ~~LOC~~ SOLN
40.0000 mg | SUBCUTANEOUS | Status: DC
  Administered 2017-04-17 – 2017-04-18 (×2): 40 mg via SUBCUTANEOUS
  Filled 2017-04-17 (×2): qty 0.4

## 2017-04-17 MED ORDER — DOXYCYCLINE HYCLATE 100 MG IV SOLR
INTRAVENOUS | Status: AC
  Filled 2017-04-17: qty 100

## 2017-04-17 MED ORDER — SODIUM CHLORIDE 0.9% FLUSH
3.0000 mL | Freq: Two times a day (BID) | INTRAVENOUS | Status: DC
  Administered 2017-04-17 – 2017-04-19 (×4): 3 mL via INTRAVENOUS

## 2017-04-17 MED ORDER — LAMOTRIGINE 100 MG PO TABS
300.0000 mg | ORAL_TABLET | Freq: Every day | ORAL | Status: DC
  Administered 2017-04-17 – 2017-04-18 (×2): 300 mg via ORAL
  Filled 2017-04-17: qty 3

## 2017-04-17 MED ORDER — ACETAMINOPHEN 325 MG PO TABS
650.0000 mg | ORAL_TABLET | Freq: Four times a day (QID) | ORAL | Status: DC | PRN
  Administered 2017-04-19: 650 mg via ORAL
  Filled 2017-04-17: qty 2

## 2017-04-17 MED ORDER — MOMETASONE FURO-FORMOTEROL FUM 200-5 MCG/ACT IN AERO
INHALATION_SPRAY | RESPIRATORY_TRACT | Status: AC
  Filled 2017-04-17: qty 8.8

## 2017-04-17 MED ORDER — GABAPENTIN 300 MG PO CAPS
900.0000 mg | ORAL_CAPSULE | Freq: Every day | ORAL | Status: DC
  Administered 2017-04-17 – 2017-04-18 (×2): 900 mg via ORAL
  Filled 2017-04-17 (×2): qty 3

## 2017-04-17 MED ORDER — MELOXICAM 7.5 MG PO TABS
15.0000 mg | ORAL_TABLET | Freq: Every day | ORAL | Status: DC
  Administered 2017-04-18 – 2017-04-19 (×2): 15 mg via ORAL
  Filled 2017-04-17 (×2): qty 1

## 2017-04-17 MED ORDER — ONDANSETRON HCL 4 MG PO TABS: 4.0000 mg | ORAL_TABLET | Freq: Four times a day (QID) | ORAL | Status: DC | PRN

## 2017-04-17 MED ORDER — IPRATROPIUM-ALBUTEROL 0.5-2.5 (3) MG/3ML IN SOLN
3.0000 mL | RESPIRATORY_TRACT | Status: DC
  Administered 2017-04-17 – 2017-04-18 (×2): 3 mL via RESPIRATORY_TRACT
  Filled 2017-04-17 (×3): qty 3

## 2017-04-18 DIAGNOSIS — Z902 Acquired absence of lung [part of]: Secondary | ICD-10-CM | POA: Diagnosis not present

## 2017-04-18 DIAGNOSIS — J441 Chronic obstructive pulmonary disease with (acute) exacerbation: Secondary | ICD-10-CM | POA: Diagnosis present

## 2017-04-18 DIAGNOSIS — J9621 Acute and chronic respiratory failure with hypoxia: Secondary | ICD-10-CM | POA: Diagnosis present

## 2017-04-18 DIAGNOSIS — Z9049 Acquired absence of other specified parts of digestive tract: Secondary | ICD-10-CM | POA: Diagnosis not present

## 2017-04-18 DIAGNOSIS — Z888 Allergy status to other drugs, medicaments and biological substances status: Secondary | ICD-10-CM | POA: Diagnosis not present

## 2017-04-18 DIAGNOSIS — Z90710 Acquired absence of both cervix and uterus: Secondary | ICD-10-CM | POA: Diagnosis not present

## 2017-04-18 DIAGNOSIS — Z79899 Other long term (current) drug therapy: Secondary | ICD-10-CM | POA: Diagnosis not present

## 2017-04-18 DIAGNOSIS — Z8673 Personal history of transient ischemic attack (TIA), and cerebral infarction without residual deficits: Secondary | ICD-10-CM | POA: Diagnosis not present

## 2017-04-18 DIAGNOSIS — K219 Gastro-esophageal reflux disease without esophagitis: Secondary | ICD-10-CM | POA: Diagnosis present

## 2017-04-18 DIAGNOSIS — I10 Essential (primary) hypertension: Secondary | ICD-10-CM | POA: Diagnosis present

## 2017-04-18 DIAGNOSIS — F17210 Nicotine dependence, cigarettes, uncomplicated: Secondary | ICD-10-CM | POA: Diagnosis present

## 2017-04-18 DIAGNOSIS — Z800 Family history of malignant neoplasm of digestive organs: Secondary | ICD-10-CM | POA: Diagnosis not present

## 2017-04-18 DIAGNOSIS — F3162 Bipolar disorder, current episode mixed, moderate: Secondary | ICD-10-CM | POA: Diagnosis present

## 2017-04-18 MED ORDER — IPRATROPIUM-ALBUTEROL 0.5-2.5 (3) MG/3ML IN SOLN
3.0000 mL | Freq: Four times a day (QID) | RESPIRATORY_TRACT | Status: DC
  Administered 2017-04-18 – 2017-04-19 (×3): 3 mL via RESPIRATORY_TRACT
  Filled 2017-04-18 (×3): qty 3

## 2017-04-18 NOTE — Care Management Obs Status (Signed)
MEDICARE OBSERVATION STATUS NOTIFICATION   Patient Details  Name: Alicia Dixon MRN: [id number] Date of Birth: November 23, 1965   Medicare Observation Status Notification Given:  Yes    Perlie Scheuring, Chrystine OilerSharley Diane, RN 04/18/2017, 11:35 AM

## 2017-04-18 NOTE — Progress Notes (Signed)
The patient refused tonight's dose of IV antiobiotic because she states that each time she gets it, it makes the IV site hurt. Antibiotic ran for 15 minutes before she requested it to be stopped due to irritation.  IV site assessed, no redness or infiltration noted. This is the 3rd IV she has had since admission yesterday evening, as she has requested IV site change after each dose of IV antibiotic. IV was flushed and saline locked.

## 2017-04-18 NOTE — Care Management Note (Signed)
Case Management Note  Patient Details  Name: Alicia Dixon MRN: [id number] Date of Birth: March 21, 1966  Subjective/Objective:   Adm with COPD exacerbation. From home with fiance. Ind PTA. [facility] continuous [facility]. Has cane if needed. Has PCP, still drives to appointments. No HH PTA. HX of strokes, ? Asp, Speech therapy consult pending.                  Action/Plan: Anticipate DC home with self care. CM following for speech recommendation.   Expected Discharge Date:                  Expected Discharge Plan:  Home/Self Care  In-House Referral:     Discharge planning Services  CM Consult  Post Acute Care Choice:    Choice offered to:     DME Arranged:    DME Agency:     HH Arranged:    HH Agency:     Status of Service:  In process, will continue to follow  If discussed at Long Length of Stay Meetings, dates discussed:    Additional Comments:  Chantele Corado, Chrystine OilerSharley Diane, RN 04/18/2017, 11:26 AM

## 2017-04-18 NOTE — Evaluation (Signed)
Clinical/Bedside Swallow Evaluation Patient Details  Name: Alicia Dixon MRN: [id number] Date of Birth: 27-Jun-1966  Today's Date: 04/18/2017 Time: SLP Start Time (ACUTE ONLY): 1115 SLP Stop Time (ACUTE ONLY): 1140 SLP Time Calculation (min) (ACUTE ONLY): 25 min  Past Medical History:  Past Medical History:  Diagnosis Date  . Asthma   . Bipolar disorder (HCC)   . COPD (chronic obstructive pulmonary disease) (HCC)   . Depression   . Stroke [facility])    Past Surgical History:  Past Surgical History:  Procedure Laterality Date  . ABDOMINAL HYSTERECTOMY    . APPENDECTOMY    . CESAREAN SECTION    . CHOLECYSTECTOMY    . LUNG LOBECTOMY     right lung    HPI:  She has been taking antibiotics and steroids on 2 different occasions since her last hospitalization and she is still coughing and congested. She's coughing up a little bit of sputum. She is wheezing. She is much more short of breath. She is still using oxygen. No chest pain nausea vomiting diarrhea abdominal pain. She does have some nasal drainage. No swelling of her legs. No PND or orthopnea.   Assessment / Plan / Recommendation Clinical Impression   Pt without overt s/s of aspiration with given consistencies ranging from thin via cup/straw-solids; impaired mastication with solids, but pt able to manage with minimal prolonged oral transit time; pt c/o "coughing at times during meals" and has a hx of CVA, esophageal dilatation, previous speech/dysphagia tx, multiple bouts with PNA and/or COPD exacerbation indicating a need for an instrumental assessment to r/o aspiration either as an IP or an OP; pt noted to be wheezing after PO intake; discussed with nursing and this has been occurring since admission (with or without PO intake).  ST will f/u for a MBS within acute setting as able and/or f/u as an OP for a MBS. SLP Visit Diagnosis: Dysphagia, unspecified (R13.10)    Aspiration Risk  Mild aspiration risk    Diet Recommendation    Regular/thin (as tolerated)  Medication Administration: Whole meds with liquid    Other  Recommendations Oral Care Recommendations: Oral care BID   Follow up Recommendations Other (comment)      Frequency and Duration min 1 x/week  1 week       Prognosis Prognosis for Safe Diet Advancement: Good      Swallow Study   General Date of Onset: 04/17/17 HPI: She has been taking antibiotics and steroids on 2 different occasions since her last hospitalization and she is still coughing and congested. She's coughing up a little bit of sputum. She is wheezing. She is much more short of breath. She is still using oxygen. No chest pain nausea vomiting diarrhea abdominal pain. She does have some nasal drainage. No swelling of her legs. No PND or orthopnea. Type of Study: Bedside Swallow Evaluation Diet Prior to this Study: Regular;Thin liquids Temperature Spikes Noted: No Respiratory Status: Nasal cannula History of Recent Intubation: No Behavior/Cognition: Alert;Cooperative;Pleasant mood Oral Cavity Assessment: Within Functional Limits Oral Care Completed by SLP: No Oral Cavity - Dentition: Dentures, not available;Edentulous Vision: Functional for self-feeding Self-Feeding Abilities: Able to feed self Patient Positioning: Upright in bed Baseline Vocal Quality: Low vocal intensity Volitional Cough: Strong Volitional Swallow: Able to elicit    Oral/Motor/Sensory Function Overall Oral Motor/Sensory Function: Within functional limits   Ice Chips Ice chips: Within functional limits Presentation: Self Fed   Thin Liquid Thin Liquid: Within functional limits Presentation: Cup;Straw  Nectar Thick Nectar Thick Liquid: Not tested   Honey Thick Honey Thick Liquid: Not tested   Puree Puree: Impaired Presentation: Self Fed Pharyngeal Phase Impairments: Multiple swallows   Solid      Solid: Impaired Presentation: Self Fed Oral Phase Impairments: Impaired mastication Oral Phase Functional  Implications: Impaired mastication Pharyngeal Phase Impairments: Multiple swallows    Functional Assessment Tool Used: NOMS; clinical judgment Functional Limitations: Swallowing Swallow Current Status ([id number]([id number]): At least 20 percent but less than 40 percent impaired, limited or restricted Swallow Goal Status [phone](G8997): At least 1 percent but less than 20 percent impaired, limited or restricted   Tressie StalkerPat Adams, M.S., CCC-SLP 04/18/2017,12:24 PM

## 2017-04-18 NOTE — H&P (Signed)
Alicia Dixon MRN: [id number] DOB/AGE: August 26, 1966 51 y.o. Primary Care Physician:Baucom, Morton Stall Admit date: 04/17/2017 Chief Complaint: Shortness of breath HPI: This is documentation of history and physical performed in my office on 04/17/2017. She came as a work in appointment directed to my office from her primary care office because of COPD exacerbation. She has been taking antibiotics and steroids on 2 different occasions since her last hospitalization and she is still coughing and congested. She's coughing up a little bit of sputum. She is wheezing. She is much more short of breath. She is still using oxygen. No chest pain nausea vomiting diarrhea abdominal pain. She does have some nasal drainage. No swelling of her legs. No PND or orthopnea.  Past Medical History:  Diagnosis Date  . Asthma   . Bipolar disorder (HCC)   . COPD (chronic obstructive pulmonary disease) (HCC)   . Depression   . Stroke [facility])    Past Surgical History:  Procedure Laterality Date  . ABDOMINAL HYSTERECTOMY    . APPENDECTOMY    . CESAREAN SECTION    . CHOLECYSTECTOMY    . LUNG LOBECTOMY     right lung         Family History  Problem Relation Age of Onset  . Colon cancer Maternal Aunt    No significant history of COPD in the family Social History:  reports that she has been smoking Cigarettes.  She has a 8.75 pack-year smoking history. She has never used smokeless tobacco. She reports that she does not drink alcohol or use drugs.   Allergies:  Allergies  Allergen Reactions  . Other Anaphylaxis    Allergic to melons  . Baclofen     hallucinations  . Flonase  [Fluticasone Propionate] Other (See Comments)    Nose swelling and nose bleeds  . Gadopentetate Dimeglumine Other (See Comments)    "purple" discoloration, slight numbness to left fifth finger, "purple dots" on the top of left hand following MRI exam 07/22/13 with Magnevist contrast.  Radiologist did not feel this was related to  contrast.  . Lortab [Hydrocodone-Acetaminophen]     N/v hives  . Seroquel [Quetiapine Fumarate]     n/v  . Sudafed [Pseudoephedrine Hcl]     blisters  . Trazodone And Nefazodone Nausea And Vomiting    Medications Prior to Admission  Medication Sig Dispense Refill  . ALPRAZolam (XANAX) 1 MG tablet Take 1 mg by mouth 4 (four) times daily.  3  . atorvastatin (LIPITOR) 20 MG tablet Take 20 mg by mouth at bedtime.  1  . cetirizine (ZYRTEC) 10 MG tablet Take 10 mg by mouth daily.    Marland Kitchen doxepin (SINEQUAN) 75 MG capsule take 1 capsule at bedtime  1  . DULERA 200-5 MCG/ACT AERO INL 2 PFS PO BID  12  . gabapentin (NEURONTIN) 300 MG capsule Take 900 mg by mouth at bedtime.  2  . ipratropium-albuterol (DUONEB) 0.5-2.5 (3) MG/3ML SOLN Take 3 mLs by nebulization every 6 (six) hours as needed (shortness of breath).     . lamoTRIgine (LAMICTAL) 150 MG tablet Take 300 mg by mouth at bedtime.  3  . meloxicam (MOBIC) 15 MG tablet TAKE 1 TABLET(15 MG) BY MOUTH DAILY 30 tablet 0  . mirtazapine (REMERON) 45 MG tablet Take 45 mg by mouth at bedtime.  3  . montelukast (SINGULAIR) 10 MG tablet Take 10 mg by mouth every evening.  2  . pantoprazole (PROTONIX) 40 MG tablet Take 1 tablet by mouth  at bedtime.    Marland Kitchen. perphenazine (TRILAFON) 8 MG tablet Take 32 mg by mouth at bedtime.   3  . predniSONE (DELTASONE) 10 MG tablet Take 10 mg by mouth daily.   5  . PROAIR HFA 108 (90 Base) MCG/ACT inhaler Inhale 2 puffs into the lungs every 4 (four) hours as needed. Shortness of breath  2  . propranolol (INDERAL) 10 MG tablet Take 1 tablet by mouth 2 (two) times daily.  1  . SAPHRIS 10 MG SUBL Place 20 mg under the tongue every evening.   3  . tiZANidine (ZANAFLEX) 2 MG tablet TAKE 1 TABLET(2 MG) BY MOUTH EVERY 8 HOURS AS NEEDED FOR MUSCLE SPASMS 90 tablet 2  . [DISCONTINUED] atenolol (TENORMIN) 25 MG tablet Take 1 tablet by mouth daily.    . [DISCONTINUED] azithromycin (ZITHROMAX) 250 MG tablet Take 2 Tablets for 1 day   thenTake 1 Tablet once a day for 4 DAYS  0  . [DISCONTINUED] levofloxacin (LEVAQUIN) 500 MG tablet Take 1 tablet (500 mg total) by mouth daily. 10 tablet 0  . [DISCONTINUED] predniSONE (STERAPRED UNI-PAK 21 TAB) 10 MG (21) TBPK tablet Take by package instructions 21 tablet 0       ZOX:WRUEAROS:apart from the symptoms mentioned above,there are no other symptoms referable to all systems reviewed.10 point review of systems otherwise negative  Physical Exam: Blood pressure 134/88, pulse (!) 116, temperature 97.7 F (36.5 C), temperature source Oral, resp. rate 20, height 5\' 8"  (1.727 m), weight 103.2 kg (227 lb 8 oz), SpO2 94 %. Constitutional: She is awake and alert and in mild respiratory distress. Eyes: Pupils react. EOMI. Ears nose mouth and throat: Her mucous membranes are moist. Hearing is grossly normal. Her throat is clear. Cardiovascular: Her heart is regular with normal heart sounds. Respiratory: Her respiratory effort is mildly increased. Lungs show bilateral wheezing and rhonchi. Gastrointestinal: Her abdomen is soft with no masses. Skin: Warm and dry. Neurological: No focal abnormalities psychiatric: Normal mood and affect musculoskeletal: Normal strength in her extremities    Recent Labs  04/17/17 1848  WBC 12.6*  NEUTROABS 9.1*  HGB 16.3*  HCT 49.5*  MCV 95.2  PLT 327    Recent Labs  04/17/17 1848  NA 142  K 3.8  CL 103  CO2 28  GLUCOSE 133*  BUN 8  CREATININE 0.67  CALCIUM 9.4  lablast2(ast:2,ALT:2,alkphos:2,bilitot:2,prot:2,albumin:2)@    No results found for this or any previous visit (from the past 240 hour(s)).   X-ray Chest Pa And Lateral  Result Date: 04/17/2017 CLINICAL DATA:  Short of breath and cough. EXAM: CHEST  2 VIEW COMPARISON:  01/17/2017 FINDINGS: The heart is normal in size. Bibasilar atelectasis and low lung volumes. Interstitial prominence is stable. No pneumothorax or pleural effusion. Plural thickening at the right apex is stable. IMPRESSION:  Bibasilar atelectasis. Electronically Signed   By: Jolaine ClickArthur  Hoss M.D.   On: 04/17/2017 19:23   Impression: She has COPD exacerbation. She's going to be treated with antibiotics and steroids inhaled bronchodilators Active Problems:   COPD exacerbation (HCC)     Plan: As above      Refujio Haymer L   04/18/2017, 7:39 AM

## 2017-04-18 NOTE — Progress Notes (Signed)
Subjective: She says she feels a little better. She is still wheezing and congested. She has no new complaints. No chest pain. We discussed the fact that she's had multiple exacerbations in the last several months and she could be aspirating. She does have a history of stroke 2. She says sometimes she has trouble forming her words but she doesn't have any overt episodes of aspiration. She is still coughing up some sputum. She is still short of breath.  Objective: Vital signs in last 24 hours: Temp:  [97.7 F (36.5 C)-98.4 F (36.9 C)] 97.7 F (36.5 C) (07/12 0654) Pulse Rate:  [94-116] 116 (07/12 0654) Resp:  [19-20] 20 (07/12 0654) BP: (128-136)/(81-93) 134/88 (07/12 0654) SpO2:  [87 %-95 %] 87 % (07/12 0755) Weight:  [103.2 kg (227 lb 8 oz)] 103.2 kg (227 lb 8 oz) (07/11 1738) Weight change:  Last BM Date: 04/17/17  Intake/Output from previous day: No intake/output data recorded.  PHYSICAL EXAM General appearance: alert, cooperative and mild distress Resp: rhonchi bilaterally Cardio: regular rate and rhythm, S1, S2 normal, no murmur, click, rub or gallop GI: soft, non-tender; bowel sounds normal; no masses,  no organomegaly Extremities: extremities normal, atraumatic, no cyanosis or edema skin warm and dry  Lab Results:  Results for orders placed or performed during the hospital encounter of 04/17/17 (from the past 48 hour(s))  Comprehensive metabolic panel     Status: Abnormal   Collection Time: 04/17/17  6:48 PM  Result Value Ref Range   Sodium 142 135 - 145 mmol/L   Potassium 3.8 3.5 - 5.1 mmol/L   Chloride 103 101 - 111 mmol/L   CO2 28 22 - 32 mmol/L   Glucose, Bld 133 (H) 65 - 99 mg/dL   BUN 8 6 - 20 mg/dL   Creatinine, Ser 0.67 0.44 - 1.00 mg/dL   Calcium 9.4 8.9 - 10.3 mg/dL   Total Protein 7.2 6.5 - 8.1 g/dL   Albumin 3.5 3.5 - 5.0 g/dL   AST 28 15 - 41 U/L   ALT 28 14 - 54 U/L   Alkaline Phosphatase 117 38 - 126 U/L   Total Bilirubin 0.6 0.3 - 1.2 mg/dL   GFR calc non Af Amer >60 >60 mL/min   GFR calc Af Amer >60 >60 mL/min    Comment: (NOTE) The eGFR has been calculated using the CKD EPI equation. This calculation has not been validated in all clinical situations. eGFR's persistently <60 mL/min signify possible Chronic Kidney Disease.    Anion gap 11 5 - 15  CBC WITH DIFFERENTIAL     Status: Abnormal   Collection Time: 04/17/17  6:48 PM  Result Value Ref Range   WBC 12.6 (H) 4.0 - 10.5 K/uL   RBC 5.20 (H) 3.87 - 5.11 MIL/uL   Hemoglobin 16.3 (H) 12.0 - 15.0 g/dL   HCT 49.5 (H) 36.0 - 46.0 %   MCV 95.2 78.0 - 100.0 fL   MCH 31.3 26.0 - 34.0 pg   MCHC 32.9 30.0 - 36.0 g/dL   RDW 15.0 11.5 - 15.5 %   Platelets 327 150 - 400 K/uL   Neutrophils Relative % 73 %   Neutro Abs 9.1 (H) 1.7 - 7.7 K/uL   Lymphocytes Relative 22 %   Lymphs Abs 2.8 0.7 - 4.0 K/uL   Monocytes Relative 5 %   Monocytes Absolute 0.7 0.1 - 1.0 K/uL   Eosinophils Relative 0 %   Eosinophils Absolute 0.1 0.0 - 0.7 K/uL   Basophils  Relative 0 %   Basophils Absolute 0.0 0.0 - 0.1 K/uL    ABGS No results for input(s): PHART, PO2ART, TCO2, HCO3 in the last 72 hours.  Invalid input(s): PCO2 CULTURES No results found for this or any previous visit (from the past 240 hour(s)). Studies/Results: X-ray Chest Pa And Lateral  Result Date: 04/17/2017 CLINICAL DATA:  Short of breath and cough. EXAM: CHEST  2 VIEW COMPARISON:  01/17/2017 FINDINGS: The heart is normal in size. Bibasilar atelectasis and low lung volumes. Interstitial prominence is stable. No pneumothorax or pleural effusion. Plural thickening at the right apex is stable. IMPRESSION: Bibasilar atelectasis. Electronically Signed   By: Marybelle Killings M.D.   On: 04/17/2017 19:23    Medications:  Prior to Admission:  Prescriptions Prior to Admission  Medication Sig Dispense Refill Last Dose  . ALPRAZolam (XANAX) 1 MG tablet Take 1 mg by mouth 4 (four) times daily.  3 04/17/2017 at Unknown time  . atorvastatin  (LIPITOR) 20 MG tablet Take 20 mg by mouth at bedtime.  1 04/16/2017 at Unknown time  . cetirizine (ZYRTEC) 10 MG tablet Take 10 mg by mouth daily.   04/17/2017 at Unknown time  . doxepin (SINEQUAN) 75 MG capsule take 1 capsule at bedtime  1 04/16/2017 at Unknown time  . DULERA 200-5 MCG/ACT AERO INL 2 PFS PO BID  12 04/17/2017 at Unknown time  . gabapentin (NEURONTIN) 300 MG capsule Take 900 mg by mouth at bedtime.  2 04/16/2017 at Unknown time  . ipratropium-albuterol (DUONEB) 0.5-2.5 (3) MG/3ML SOLN Take 3 mLs by nebulization every 6 (six) hours as needed (shortness of breath).    04/17/2017 at Unknown time  . lamoTRIgine (LAMICTAL) 150 MG tablet Take 300 mg by mouth at bedtime.  3 04/16/2017 at Unknown time  . meloxicam (MOBIC) 15 MG tablet TAKE 1 TABLET(15 MG) BY MOUTH DAILY 30 tablet 0 04/16/2017 at Unknown time  . mirtazapine (REMERON) 45 MG tablet Take 45 mg by mouth at bedtime.  3 04/16/2017 at Unknown time  . montelukast (SINGULAIR) 10 MG tablet Take 10 mg by mouth every evening.  2 04/16/2017 at Unknown time  . pantoprazole (PROTONIX) 40 MG tablet Take 1 tablet by mouth at bedtime.   04/17/2017 at Unknown time  . perphenazine (TRILAFON) 8 MG tablet Take 32 mg by mouth at bedtime.   3 04/16/2017 at Unknown time  . predniSONE (DELTASONE) 10 MG tablet Take 10 mg by mouth daily.   5 04/17/2017 at Unknown time  . PROAIR HFA 108 (90 Base) MCG/ACT inhaler Inhale 2 puffs into the lungs every 4 (four) hours as needed. Shortness of breath  2 04/17/2017 at Unknown time  . propranolol (INDERAL) 10 MG tablet Take 1 tablet by mouth 2 (two) times daily.  1 04/17/2017 at Unknown time  . SAPHRIS 10 MG SUBL Place 20 mg under the tongue every evening.   3 04/16/2017 at Unknown time  . tiZANidine (ZANAFLEX) 2 MG tablet TAKE 1 TABLET(2 MG) BY MOUTH EVERY 8 HOURS AS NEEDED FOR MUSCLE SPASMS 90 tablet 2 04/17/2017 at Unknown time  . [DISCONTINUED] atenolol (TENORMIN) 25 MG tablet Take 1 tablet by mouth daily.   Taking  .  [DISCONTINUED] azithromycin (ZITHROMAX) 250 MG tablet Take 2 Tablets for 1 day  thenTake 1 Tablet once a day for 4 DAYS  0 Taking  . [DISCONTINUED] levofloxacin (LEVAQUIN) 500 MG tablet Take 1 tablet (500 mg total) by mouth daily. 10 tablet 0 Taking  . [DISCONTINUED] predniSONE (  STERAPRED UNI-PAK 21 TAB) 10 MG (21) TBPK tablet Take by package instructions 21 tablet 0 Taking   Scheduled: . ALPRAZolam  1 mg Oral QID  . asenapine  20 mg Sublingual QPM  . atorvastatin  20 mg Oral QHS  . doxepin  75 mg Oral QHS  . enoxaparin (LOVENOX) injection  40 mg Subcutaneous Q24H  . gabapentin  900 mg Oral QHS  . guaiFENesin  600 mg Oral BID  . ipratropium-albuterol  3 mL Nebulization Q4H  . lamoTRIgine  300 mg Oral QHS  . loratadine  10 mg Oral Daily  . meloxicam  15 mg Oral Daily  . methylPREDNISolone (SOLU-MEDROL) injection  60 mg Intravenous Q6H  . mirtazapine  45 mg Oral QHS  . mometasone-formoterol  2 puff Inhalation BID  . montelukast  10 mg Oral QPM  . pantoprazole  40 mg Oral QHS  . perphenazine  32 mg Oral QHS  . propranolol  10 mg Oral BID  . sodium chloride flush  3 mL Intravenous Q12H   Continuous: . sodium chloride    . doxycycline (VIBRAMYCIN) IV Stopped (04/18/17 0038)   JGO:TLXBWI chloride, acetaminophen **OR** acetaminophen, albuterol, ondansetron **OR** ondansetron (ZOFRAN) IV, sodium chloride flush, tiZANidine  Assesment: She was admitted with COPD exacerbation and acute on chronic hypoxic respiratory failure. She does have a previous history of stroke and I'm concerned that she may be aspirating. She is still coughing and congested. She feels a little better but not much. Active Problems:   COPD exacerbation (HCC)   Acute on chronic respiratory failure with hypoxia (HCC)   Bipolar 1 disorder, mixed, moderate (HCC)   Personal history of stroke with current residual effects   Essential hypertension   GERD (gastroesophageal reflux disease)    Plan: Speech evaluation.  Continue IV antibiotics IV steroids inhaled bronchodilators    LOS: 0 days   Marice Guidone L 04/18/2017, 8:13 AM

## 2017-04-19 ENCOUNTER — Encounter: Payer: Medicare Other | Admitting: Physical Medicine & Rehabilitation

## 2017-04-19 MED ORDER — PREDNISONE 10 MG PO TABS: 10.0000 mg | ORAL_TABLET | Freq: Every day | ORAL | 0 refills | Status: DC

## 2017-04-19 MED ORDER — GUAIFENESIN ER 600 MG PO TB12: 600.0000 mg | ORAL_TABLET | Freq: Two times a day (BID) | ORAL | 2 refills | Status: DC

## 2017-04-19 MED ORDER — IPRATROPIUM-ALBUTEROL 0.5-2.5 (3) MG/3ML IN SOLN: 3.0000 mL | Freq: Four times a day (QID) | RESPIRATORY_TRACT | 12 refills | Status: DC

## 2017-04-19 MED ORDER — DOXYCYCLINE HYCLATE 100 MG PO CAPS: 100.0000 mg | ORAL_CAPSULE | Freq: Two times a day (BID) | ORAL | 1 refills | Status: DC

## 2017-04-19 NOTE — Discharge Summary (Signed)
Physician Discharge Summary  Patient ID: Alicia Dixon MRN: [id number] DOB/AGE: 12-14-1965 51 y.o. Primary Care Physician:Baucom, Shaune Pollack, PA-C Admit date: 04/17/2017 Discharge date: 04/19/2017    Discharge Diagnoses:   Active Problems:   COPD exacerbation (HCC)   Acute on chronic respiratory failure with hypoxia (HCC)   Bipolar 1 disorder, mixed, moderate (HCC)   Personal history of stroke with current residual effects   Essential hypertension   GERD (gastroesophageal reflux disease)   Allergies as of 04/19/2017      Reactions   Other Anaphylaxis   Allergic to melons   Baclofen    hallucinations   Flonase  [fluticasone Propionate] Other (See Comments)   Nose swelling and nose bleeds   Gadopentetate Dimeglumine Other (See Comments)   "purple" discoloration, slight numbness to left fifth finger, "purple dots" on the top of left hand following MRI exam 07/22/13 with Magnevist contrast.  Radiologist did not feel this was related to contrast.   Lortab [hydrocodone-acetaminophen]    N/v hives   Seroquel [quetiapine Fumarate]    n/v   Sudafed [pseudoephedrine Hcl]    blisters   Trazodone And Nefazodone Nausea And Vomiting      Medication List    TAKE these medications   ALPRAZolam 1 MG tablet Commonly known as:  XANAX Take 1 mg by mouth 4 (four) times daily.   atorvastatin 20 MG tablet Commonly known as:  LIPITOR Take 20 mg by mouth at bedtime.   cetirizine 10 MG tablet Commonly known as:  ZYRTEC Take 10 mg by mouth daily.   doxepin 75 MG capsule Commonly known as:  SINEQUAN take 1 capsule at bedtime   doxycycline 100 MG capsule Commonly known as:  VIBRAMYCIN Take 1 capsule (100 mg total) by mouth 2 (two) times daily.   DULERA 200-5 MCG/ACT Aero Generic drug:  mometasone-formoterol INL 2 PFS PO BID   gabapentin 300 MG capsule Commonly known as:  NEURONTIN Take 900 mg by mouth at bedtime.   guaiFENesin 600 MG 12 hr tablet Commonly known as:   MUCINEX Take 1 tablet (600 mg total) by mouth 2 (two) times daily.   ipratropium-albuterol 0.5-2.5 (3) MG/3ML Soln Commonly known as:  DUONEB Take 3 mLs by nebulization every 6 (six) hours as needed (shortness of breath). What changed:  Another medication with the same name was added. Make sure you understand how and when to take each.   ipratropium-albuterol 0.5-2.5 (3) MG/3ML Soln Commonly known as:  DUONEB Take 3 mLs by nebulization 4 (four) times daily. What changed:  You were already taking a medication with the same name, and this prescription was added. Make sure you understand how and when to take each.   lamoTRIgine 150 MG tablet Commonly known as:  LAMICTAL Take 300 mg by mouth at bedtime.   meloxicam 15 MG tablet Commonly known as:  MOBIC TAKE 1 TABLET(15 MG) BY MOUTH DAILY   mirtazapine 45 MG tablet Commonly known as:  REMERON Take 45 mg by mouth at bedtime.   montelukast 10 MG tablet Commonly known as:  SINGULAIR Take 10 mg by mouth every evening.   pantoprazole 40 MG tablet Commonly known as:  PROTONIX Take 1 tablet by mouth at bedtime.   perphenazine 8 MG tablet Commonly known as:  TRILAFON Take 32 mg by mouth at bedtime.   predniSONE 10 MG tablet Commonly known as:  DELTASONE Take 10 mg by mouth daily. What changed:  Another medication with the same name was added. Make sure  you understand how and when to take each.   predniSONE 10 MG tablet Commonly known as:  DELTASONE Take 1 tablet (10 mg total) by mouth daily. 6 daily for 3 days, 5 daily for 3 days, 4 daily for 3 days, 3 daily for 3 days, 2 daily for 3 days and then 1 daily What changed:  You were already taking a medication with the same name, and this prescription was added. Make sure you understand how and when to take each.   PROAIR HFA 108 (90 Base) MCG/ACT inhaler Generic drug:  albuterol Inhale 2 puffs into the lungs every 4 (four) hours as needed. Shortness of breath   propranolol 10 MG  tablet Commonly known as:  INDERAL Take 1 tablet by mouth 2 (two) times daily.   SAPHRIS 10 MG Subl Generic drug:  Asenapine Maleate Place 20 mg under the tongue every evening.   tiZANidine 2 MG tablet Commonly known as:  ZANAFLEX TAKE 1 TABLET(2 MG) BY MOUTH EVERY 8 HOURS AS NEEDED FOR MUSCLE SPASMS       Discharged Condition:Improved    Consults: None  Significant Diagnostic Studies: X-ray Chest Pa And Lateral  Result Date: 04/17/2017 CLINICAL DATA:  Short of breath and cough. EXAM: CHEST  2 VIEW COMPARISON:  01/17/2017 FINDINGS: The heart is normal in size. Bibasilar atelectasis and low lung volumes. Interstitial prominence is stable. No pneumothorax or pleural effusion. Plural thickening at the right apex is stable. IMPRESSION: Bibasilar atelectasis. Electronically Signed   By: Jolaine ClickArthur  Hoss M.D.   On: 04/17/2017 19:23    Lab Results: Basic Metabolic Panel:  Recent Labs  40/98/1106/08/25 1848  NA 142  K 3.8  CL 103  CO2 28  GLUCOSE 133*  BUN 8  CREATININE 0.67  CALCIUM 9.4   Liver Function Tests:  Recent Labs  04/17/17 1848  AST 28  ALT 28  ALKPHOS 117  BILITOT 0.6  PROT 7.2  ALBUMIN 3.5     CBC:  Recent Labs  04/17/17 1848  WBC 12.6*  NEUTROABS 9.1*  HGB 16.3*  HCT 49.5*  MCV 95.2  PLT 327    No results found for this or any previous visit (from the past 240 hour(s)).   Hospital Course: This is a 51 year old with known severe COPD, bipolar disease and previous history of stroke who came to my office at the direction of her primary care physician because of COPD exacerbation. She has been on 2 courses of prednisone and antibiotics and has not improved. She was admitted from my office. She was started on IV steroids IV antibiotics inhale bronchodilators and improved over the next 48 hours to the point that she was ready for discharge. She had speech evaluation because of concerns that she's had more exacerbations recently and that that might be  associated with swallowing problems related to her previous stroke. Initial evaluation showed that she was at mild increased risk of aspiration and she is to have modified barium swallow as an outpatient.  Discharge Exam: Blood pressure (!) 145/93, pulse (!) 119, temperature 98 F (36.7 C), temperature source Oral, resp. rate 20, height 5\' 8"  (1.727 m), weight 103.2 kg (227 lb 8 oz), SpO2 (!) 88 %. She is awake and alert. No acute distress. Her lungs are clear.  Disposition: Home. She needs to modified barium swallow and she has already set up an appointment with me      Signed: Patton Swisher L   04/19/2017, 8:31 AM

## 2017-04-19 NOTE — Progress Notes (Signed)
Pt IV removed, WNL. D/C instructions given to pt. Verbalized understanding. Pt family member at bedside to transport home.  

## 2017-04-19 NOTE — Progress Notes (Signed)
Subjective: She says she feels well and wants to go home. No new complaints.  Objective: Vital signs in last 24 hours: Temp:  [98 F (36.7 C)-98.2 F (36.8 C)] 98 F (36.7 C) (07/13 0510) Pulse Rate:  [93-119] 119 (07/13 0510) Resp:  [20] 20 (07/13 0510) BP: (123-145)/(77-105) 145/93 (07/13 0515) SpO2:  [88 %-93 %] 88 % (07/13 0738) Weight change:  Last BM Date: 04/17/17  Intake/Output from previous day: 07/12 0701 - 07/13 0700 In: 1220 [P.O.:720; IV Piggyback:500] Out: -   PHYSICAL EXAM General appearance: alert, cooperative and no distress Resp: clear to auscultation bilaterally Cardio: regular rate and rhythm, S1, S2 normal, no murmur, click, rub or gallop GI: soft, non-tender; bowel sounds normal; no masses,  no organomegaly Extremities: extremities normal, atraumatic, no cyanosis or edema Skin warm and dry  Lab Results:  Results for orders placed or performed during the hospital encounter of 04/17/17 (from the past 48 hour(s))  Comprehensive metabolic panel     Status: Abnormal   Collection Time: 04/17/17  6:48 PM  Result Value Ref Range   Sodium 142 135 - 145 mmol/L   Potassium 3.8 3.5 - 5.1 mmol/L   Chloride 103 101 - 111 mmol/L   CO2 28 22 - 32 mmol/L   Glucose, Bld 133 (H) 65 - 99 mg/dL   BUN 8 6 - 20 mg/dL   Creatinine, Ser 0.67 0.44 - 1.00 mg/dL   Calcium 9.4 8.9 - 10.3 mg/dL   Total Protein 7.2 6.5 - 8.1 g/dL   Albumin 3.5 3.5 - 5.0 g/dL   AST 28 15 - 41 U/L   ALT 28 14 - 54 U/L   Alkaline Phosphatase 117 38 - 126 U/L   Total Bilirubin 0.6 0.3 - 1.2 mg/dL   GFR calc non Af Amer >60 >60 mL/min   GFR calc Af Amer >60 >60 mL/min    Comment: (NOTE) The eGFR has been calculated using the CKD EPI equation. This calculation has not been validated in all clinical situations. eGFR's persistently <60 mL/min signify possible Chronic Kidney Disease.    Anion gap 11 5 - 15  CBC WITH DIFFERENTIAL     Status: Abnormal   Collection Time: 04/17/17  6:48 PM   Result Value Ref Range   WBC 12.6 (H) 4.0 - 10.5 K/uL   RBC 5.20 (H) 3.87 - 5.11 MIL/uL   Hemoglobin 16.3 (H) 12.0 - 15.0 g/dL   HCT 49.5 (H) 36.0 - 46.0 %   MCV 95.2 78.0 - 100.0 fL   MCH 31.3 26.0 - 34.0 pg   MCHC 32.9 30.0 - 36.0 g/dL   RDW 15.0 11.5 - 15.5 %   Platelets 327 150 - 400 K/uL   Neutrophils Relative % 73 %   Neutro Abs 9.1 (H) 1.7 - 7.7 K/uL   Lymphocytes Relative 22 %   Lymphs Abs 2.8 0.7 - 4.0 K/uL   Monocytes Relative 5 %   Monocytes Absolute 0.7 0.1 - 1.0 K/uL   Eosinophils Relative 0 %   Eosinophils Absolute 0.1 0.0 - 0.7 K/uL   Basophils Relative 0 %   Basophils Absolute 0.0 0.0 - 0.1 K/uL    ABGS No results for input(s): PHART, PO2ART, TCO2, HCO3 in the last 72 hours.  Invalid input(s): PCO2 CULTURES No results found for this or any previous visit (from the past 240 hour(s)). Studies/Results: X-ray Chest Pa And Lateral  Result Date: 04/17/2017 CLINICAL DATA:  Short of breath and cough. EXAM: CHEST  2  VIEW COMPARISON:  01/17/2017 FINDINGS: The heart is normal in size. Bibasilar atelectasis and low lung volumes. Interstitial prominence is stable. No pneumothorax or pleural effusion. Plural thickening at the right apex is stable. IMPRESSION: Bibasilar atelectasis. Electronically Signed   By: Marybelle Killings M.D.   On: 04/17/2017 19:23    Medications:  Prior to Admission:  Prescriptions Prior to Admission  Medication Sig Dispense Refill Last Dose  . ALPRAZolam (XANAX) 1 MG tablet Take 1 mg by mouth 4 (four) times daily.  3 04/17/2017 at Unknown time  . atorvastatin (LIPITOR) 20 MG tablet Take 20 mg by mouth at bedtime.  1 04/16/2017 at Unknown time  . cetirizine (ZYRTEC) 10 MG tablet Take 10 mg by mouth daily.   04/17/2017 at Unknown time  . doxepin (SINEQUAN) 75 MG capsule take 1 capsule at bedtime  1 04/16/2017 at Unknown time  . DULERA 200-5 MCG/ACT AERO INL 2 PFS PO BID  12 04/17/2017 at Unknown time  . gabapentin (NEURONTIN) 300 MG capsule Take 900 mg by  mouth at bedtime.  2 04/16/2017 at Unknown time  . ipratropium-albuterol (DUONEB) 0.5-2.5 (3) MG/3ML SOLN Take 3 mLs by nebulization every 6 (six) hours as needed (shortness of breath).    04/17/2017 at Unknown time  . lamoTRIgine (LAMICTAL) 150 MG tablet Take 300 mg by mouth at bedtime.  3 04/16/2017 at Unknown time  . meloxicam (MOBIC) 15 MG tablet TAKE 1 TABLET(15 MG) BY MOUTH DAILY 30 tablet 0 04/16/2017 at Unknown time  . mirtazapine (REMERON) 45 MG tablet Take 45 mg by mouth at bedtime.  3 04/16/2017 at Unknown time  . montelukast (SINGULAIR) 10 MG tablet Take 10 mg by mouth every evening.  2 04/16/2017 at Unknown time  . pantoprazole (PROTONIX) 40 MG tablet Take 1 tablet by mouth at bedtime.   04/17/2017 at Unknown time  . perphenazine (TRILAFON) 8 MG tablet Take 32 mg by mouth at bedtime.   3 04/16/2017 at Unknown time  . predniSONE (DELTASONE) 10 MG tablet Take 10 mg by mouth daily.   5 04/17/2017 at Unknown time  . PROAIR HFA 108 (90 Base) MCG/ACT inhaler Inhale 2 puffs into the lungs every 4 (four) hours as needed. Shortness of breath  2 04/17/2017 at Unknown time  . propranolol (INDERAL) 10 MG tablet Take 1 tablet by mouth 2 (two) times daily.  1 04/17/2017 at Unknown time  . SAPHRIS 10 MG SUBL Place 20 mg under the tongue every evening.   3 04/16/2017 at Unknown time  . tiZANidine (ZANAFLEX) 2 MG tablet TAKE 1 TABLET(2 MG) BY MOUTH EVERY 8 HOURS AS NEEDED FOR MUSCLE SPASMS 90 tablet 2 04/17/2017 at Unknown time  . [DISCONTINUED] atenolol (TENORMIN) 25 MG tablet Take 1 tablet by mouth daily.   Taking  . [DISCONTINUED] azithromycin (ZITHROMAX) 250 MG tablet Take 2 Tablets for 1 day  thenTake 1 Tablet once a day for 4 DAYS  0 Taking  . [DISCONTINUED] levofloxacin (LEVAQUIN) 500 MG tablet Take 1 tablet (500 mg total) by mouth daily. 10 tablet 0 Taking  . [DISCONTINUED] predniSONE (STERAPRED UNI-PAK 21 TAB) 10 MG (21) TBPK tablet Take by package instructions 21 tablet 0 Taking   Scheduled: . ALPRAZolam   1 mg Oral QID  . asenapine  20 mg Sublingual QPM  . atorvastatin  20 mg Oral QHS  . doxepin  75 mg Oral QHS  . enoxaparin (LOVENOX) injection  40 mg Subcutaneous Q24H  . gabapentin  900 mg Oral QHS  .  guaiFENesin  600 mg Oral BID  . ipratropium-albuterol  3 mL Nebulization QID  . lamoTRIgine  300 mg Oral QHS  . loratadine  10 mg Oral Daily  . meloxicam  15 mg Oral Daily  . methylPREDNISolone (SOLU-MEDROL) injection  60 mg Intravenous Q6H  . mirtazapine  45 mg Oral QHS  . mometasone-formoterol  2 puff Inhalation BID  . montelukast  10 mg Oral QPM  . pantoprazole  40 mg Oral QHS  . perphenazine  32 mg Oral QHS  . propranolol  10 mg Oral BID  . sodium chloride flush  3 mL Intravenous Q12H   Continuous: . sodium chloride    . doxycycline (VIBRAMYCIN) IV Stopped (04/18/17 2320)   PHX:TAVWPV chloride, acetaminophen **OR** acetaminophen, albuterol, ondansetron **OR** ondansetron (ZOFRAN) IV, sodium chloride flush, tiZANidine  Assesment: She was admitted with COPD exacerbation and acute on chronic hypoxic respiratory failure. She says she feels better and wants to go home. She was evaluated for swallowing issues and will have an outpatient modified barium swallow done. Active Problems:   COPD exacerbation (HCC)   Acute on chronic respiratory failure with hypoxia (HCC)   Bipolar 1 disorder, mixed, moderate (HCC)   Personal history of stroke with current residual effects   Essential hypertension   GERD (gastroesophageal reflux disease)    Plan: Discharge home today    LOS: 1 day   Classie Weng L 04/19/2017, 8:09 AM

## 2017-05-16 ENCOUNTER — Encounter: Payer: Medicare Other | Admitting: Physical Medicine & Rehabilitation

## 2017-05-18 ENCOUNTER — Other Ambulatory Visit: Payer: Self-pay | Admitting: Physical Medicine & Rehabilitation

## 2017-05-22 ENCOUNTER — Inpatient Hospital Stay (HOSPITAL_COMMUNITY)
Admission: AD | Admit: 2017-05-22 | Discharge: 2017-05-24 | DRG: 190 | Disposition: A | Discharge status: Home / Self Care | Payer: Medicare Other | Source: Ambulatory Visit | Attending: Pulmonary Disease | Admitting: Pulmonary Disease

## 2017-05-22 ENCOUNTER — Observation Stay (HOSPITAL_COMMUNITY): Payer: Medicare Other

## 2017-05-22 ENCOUNTER — Encounter (HOSPITAL_COMMUNITY): Payer: Self-pay

## 2017-05-22 ENCOUNTER — Encounter: Payer: Medicare Other | Attending: Physical Medicine & Rehabilitation | Admitting: Physical Medicine & Rehabilitation

## 2017-05-22 ENCOUNTER — Encounter: Payer: Self-pay | Admitting: Physical Medicine & Rehabilitation

## 2017-05-22 VITALS — BP 89/63 | HR 97 | Resp 14

## 2017-05-22 DIAGNOSIS — M545 Low back pain: Secondary | ICD-10-CM | POA: Insufficient documentation

## 2017-05-22 DIAGNOSIS — I6930 Unspecified sequelae of cerebral infarction: Secondary | ICD-10-CM

## 2017-05-22 DIAGNOSIS — Z8673 Personal history of transient ischemic attack (TIA), and cerebral infarction without residual deficits: Secondary | ICD-10-CM

## 2017-05-22 DIAGNOSIS — Z7952 Long term (current) use of systemic steroids: Secondary | ICD-10-CM

## 2017-05-22 DIAGNOSIS — Z90710 Acquired absence of both cervix and uterus: Secondary | ICD-10-CM

## 2017-05-22 DIAGNOSIS — J9811 Atelectasis: Secondary | ICD-10-CM | POA: Diagnosis present

## 2017-05-22 DIAGNOSIS — I10 Essential (primary) hypertension: Secondary | ICD-10-CM | POA: Diagnosis present

## 2017-05-22 DIAGNOSIS — Z79899 Other long term (current) drug therapy: Secondary | ICD-10-CM

## 2017-05-22 DIAGNOSIS — Z9049 Acquired absence of other specified parts of digestive tract: Secondary | ICD-10-CM

## 2017-05-22 DIAGNOSIS — G479 Sleep disorder, unspecified: Secondary | ICD-10-CM | POA: Insufficient documentation

## 2017-05-22 DIAGNOSIS — Z902 Acquired absence of lung [part of]: Secondary | ICD-10-CM

## 2017-05-22 DIAGNOSIS — M791 Myalgia, unspecified site: Secondary | ICD-10-CM

## 2017-05-22 DIAGNOSIS — J441 Chronic obstructive pulmonary disease with (acute) exacerbation: Principal | ICD-10-CM | POA: Diagnosis present

## 2017-05-22 DIAGNOSIS — F319 Bipolar disorder, unspecified: Secondary | ICD-10-CM | POA: Insufficient documentation

## 2017-05-22 DIAGNOSIS — J449 Chronic obstructive pulmonary disease, unspecified: Secondary | ICD-10-CM | POA: Diagnosis present

## 2017-05-22 DIAGNOSIS — Z883 Allergy status to other anti-infective agents status: Secondary | ICD-10-CM

## 2017-05-22 DIAGNOSIS — F3162 Bipolar disorder, current episode mixed, moderate: Secondary | ICD-10-CM | POA: Diagnosis present

## 2017-05-22 DIAGNOSIS — Z825 Family history of asthma and other chronic lower respiratory diseases: Secondary | ICD-10-CM

## 2017-05-22 DIAGNOSIS — R55 Syncope and collapse: Secondary | ICD-10-CM

## 2017-05-22 DIAGNOSIS — Z9109 Other allergy status, other than to drugs and biological substances: Secondary | ICD-10-CM

## 2017-05-22 DIAGNOSIS — Z885 Allergy status to narcotic agent status: Secondary | ICD-10-CM

## 2017-05-22 DIAGNOSIS — G8929 Other chronic pain: Secondary | ICD-10-CM | POA: Diagnosis not present

## 2017-05-22 DIAGNOSIS — F17210 Nicotine dependence, cigarettes, uncomplicated: Secondary | ICD-10-CM | POA: Diagnosis present

## 2017-05-22 DIAGNOSIS — J9621 Acute and chronic respiratory failure with hypoxia: Secondary | ICD-10-CM | POA: Diagnosis present

## 2017-05-22 DIAGNOSIS — R269 Unspecified abnormalities of gait and mobility: Secondary | ICD-10-CM | POA: Insufficient documentation

## 2017-05-22 DIAGNOSIS — Z720 Tobacco use: Secondary | ICD-10-CM | POA: Insufficient documentation

## 2017-05-22 DIAGNOSIS — Z716 Tobacco abuse counseling: Secondary | ICD-10-CM | POA: Diagnosis not present

## 2017-05-22 DIAGNOSIS — K219 Gastro-esophageal reflux disease without esophagitis: Secondary | ICD-10-CM | POA: Diagnosis present

## 2017-05-22 DIAGNOSIS — R569 Unspecified convulsions: Secondary | ICD-10-CM | POA: Diagnosis present

## 2017-05-22 DIAGNOSIS — Z9981 Dependence on supplemental oxygen: Secondary | ICD-10-CM | POA: Insufficient documentation

## 2017-05-22 LAB — CBC WITH DIFFERENTIAL/PLATELET
BASOS ABS: 0 10*3/uL (ref 0.0–0.1)
BASOS PCT: 0 %
EOS ABS: 0.1 10*3/uL (ref 0.0–0.7)
Eosinophils Relative: 1 %
HEMATOCRIT: 48.5 % — AB (ref 36.0–46.0)
HEMOGLOBIN: 15.4 g/dL — AB (ref 12.0–15.0)
Lymphocytes Relative: 22 %
Lymphs Abs: 2.7 10*3/uL (ref 0.7–4.0)
MCH: 30.6 pg (ref 26.0–34.0)
MCHC: 31.8 g/dL (ref 30.0–36.0)
MCV: 96.2 fL (ref 78.0–100.0)
MONO ABS: 1.2 10*3/uL — AB (ref 0.1–1.0)
Monocytes Relative: 10 %
NEUTROS ABS: 8.6 10*3/uL — AB (ref 1.7–7.7)
NEUTROS PCT: 67 %
Platelets: 326 10*3/uL (ref 150–400)
RBC: 5.04 MIL/uL (ref 3.87–5.11)
RDW: 15.3 % (ref 11.5–15.5)
WBC: 12.6 10*3/uL — ABNORMAL HIGH (ref 4.0–10.5)

## 2017-05-22 LAB — COMPREHENSIVE METABOLIC PANEL
ALK PHOS: 111 U/L (ref 38–126)
ALT: 35 U/L (ref 14–54)
ANION GAP: 8 (ref 5–15)
AST: 27 U/L (ref 15–41)
Albumin: 3.4 g/dL — ABNORMAL LOW (ref 3.5–5.0)
BUN: 8 mg/dL (ref 6–20)
CALCIUM: 9 mg/dL (ref 8.9–10.3)
CO2: 30 mmol/L (ref 22–32)
CREATININE: 0.68 mg/dL (ref 0.44–1.00)
Chloride: 106 mmol/L (ref 101–111)
GFR calc non Af Amer: >60 mL/min (ref >60)
Glucose, Bld: 98 mg/dL (ref 65–99)
Potassium: 3.6 mmol/L (ref 3.5–5.1)
Sodium: 144 mmol/L (ref 135–145)
Total Bilirubin: 0.5 mg/dL (ref 0.3–1.2)
Total Protein: 6.6 g/dL (ref 6.5–8.1)

## 2017-05-22 LAB — BRAIN NATRIURETIC PEPTIDE: B NATRIURETIC PEPTIDE 5: 47 pg/mL (ref 0.0–100.0)

## 2017-05-22 MED ORDER — MELOXICAM 7.5 MG PO TABS
ORAL_TABLET | ORAL | Status: AC
  Filled 2017-05-22: qty 2

## 2017-05-22 MED ORDER — ENOXAPARIN SODIUM 40 MG/0.4ML ~~LOC~~ SOLN
40.0000 mg | SUBCUTANEOUS | Status: DC
  Administered 2017-05-22: 40 mg via SUBCUTANEOUS
  Filled 2017-05-22: qty 0.4

## 2017-05-22 MED ORDER — IPRATROPIUM-ALBUTEROL 0.5-2.5 (3) MG/3ML IN SOLN
3.0000 mL | Freq: Four times a day (QID) | RESPIRATORY_TRACT | Status: DC
  Administered 2017-05-22 – 2017-05-24 (×6): 3 mL via RESPIRATORY_TRACT
  Filled 2017-05-22 (×5): qty 3

## 2017-05-22 MED ORDER — METHYLPREDNISOLONE SODIUM SUCC 125 MG IJ SOLR
60.0000 mg | Freq: Four times a day (QID) | INTRAMUSCULAR | Status: DC
  Administered 2017-05-22 – 2017-05-24 (×7): 60 mg via INTRAVENOUS
  Filled 2017-05-22 (×7): qty 2

## 2017-05-22 MED ORDER — ATORVASTATIN CALCIUM 20 MG PO TABS
20.0000 mg | ORAL_TABLET | Freq: Every day | ORAL | Status: DC
  Administered 2017-05-22 – 2017-05-23 (×2): 20 mg via ORAL
  Filled 2017-05-22 (×2): qty 1

## 2017-05-22 MED ORDER — MOMETASONE FURO-FORMOTEROL FUM 200-5 MCG/ACT IN AERO
2.0000 | INHALATION_SPRAY | Freq: Two times a day (BID) | RESPIRATORY_TRACT | Status: DC
  Administered 2017-05-22 – 2017-05-24 (×4): 2 via RESPIRATORY_TRACT
  Filled 2017-05-22 (×2): qty 8.8

## 2017-05-22 MED ORDER — MIRTAZAPINE 15 MG PO TABS
45.0000 mg | ORAL_TABLET | Freq: Every day | ORAL | Status: DC
  Administered 2017-05-22 – 2017-05-23 (×2): 45 mg via ORAL
  Filled 2017-05-22 (×2): qty 1

## 2017-05-22 MED ORDER — MELOXICAM 7.5 MG PO TABS
15.0000 mg | ORAL_TABLET | Freq: Every day | ORAL | Status: DC
  Administered 2017-05-22 – 2017-05-24 (×3): 15 mg via ORAL
  Filled 2017-05-22: qty 2
  Filled 2017-05-22 (×2): qty 1
  Filled 2017-05-22: qty 2
  Filled 2017-05-22: qty 1
  Filled 2017-05-22: qty 2

## 2017-05-22 MED ORDER — ALPRAZOLAM 1 MG PO TABS
1.0000 mg | ORAL_TABLET | Freq: Four times a day (QID) | ORAL | Status: DC
  Administered 2017-05-22: 1 mg via ORAL
  Filled 2017-05-22 (×2): qty 1

## 2017-05-22 MED ORDER — ACETAMINOPHEN 325 MG PO TABS
650.0000 mg | ORAL_TABLET | Freq: Four times a day (QID) | ORAL | Status: DC | PRN
  Administered 2017-05-24: 650 mg via ORAL
  Filled 2017-05-22: qty 2

## 2017-05-22 MED ORDER — MONTELUKAST SODIUM 10 MG PO TABS
10.0000 mg | ORAL_TABLET | Freq: Every evening | ORAL | Status: DC
  Administered 2017-05-22 – 2017-05-23 (×2): 10 mg via ORAL
  Filled 2017-05-22 (×2): qty 1

## 2017-05-22 MED ORDER — TIZANIDINE HCL 4 MG PO TABS
2.0000 mg | ORAL_TABLET | Freq: Three times a day (TID) | ORAL | Status: DC | PRN
  Administered 2017-05-24: 2 mg via ORAL
  Filled 2017-05-22: qty 1

## 2017-05-22 MED ORDER — LORATADINE 10 MG PO TABS
10.0000 mg | ORAL_TABLET | Freq: Every day | ORAL | Status: DC
  Administered 2017-05-23 – 2017-05-24 (×2): 10 mg via ORAL
  Filled 2017-05-22 (×2): qty 1

## 2017-05-22 MED ORDER — SODIUM CHLORIDE 0.9% FLUSH
3.0000 mL | Freq: Two times a day (BID) | INTRAVENOUS | Status: DC
  Administered 2017-05-23 (×2): 3 mL via INTRAVENOUS

## 2017-05-22 MED ORDER — LEVOFLOXACIN IN D5W 500 MG/100ML IV SOLN
500.0000 mg | INTRAVENOUS | Status: DC
  Administered 2017-05-22 – 2017-05-23 (×2): 500 mg via INTRAVENOUS
  Filled 2017-05-22 (×2): qty 100

## 2017-05-22 MED ORDER — ACETAMINOPHEN 650 MG RE SUPP: 650.0000 mg | Freq: Four times a day (QID) | RECTAL | Status: DC | PRN

## 2017-05-22 MED ORDER — SODIUM CHLORIDE 0.9% FLUSH: 3.0000 mL | INTRAVENOUS | Status: DC | PRN

## 2017-05-22 MED ORDER — GUAIFENESIN ER 600 MG PO TB12
600.0000 mg | ORAL_TABLET | Freq: Two times a day (BID) | ORAL | Status: DC
  Administered 2017-05-22 – 2017-05-24 (×4): 600 mg via ORAL
  Filled 2017-05-22 (×4): qty 1

## 2017-05-22 MED ORDER — DOXEPIN HCL 25 MG PO CAPS
75.0000 mg | ORAL_CAPSULE | Freq: Every day | ORAL | Status: DC
  Administered 2017-05-22 – 2017-05-23 (×2): 75 mg via ORAL
  Filled 2017-05-22 (×2): qty 3

## 2017-05-22 MED ORDER — SODIUM CHLORIDE 0.9 % IV SOLN: 250.0000 mL | INTRAVENOUS | Status: DC | PRN

## 2017-05-22 MED ORDER — PERPHENAZINE 8 MG PO TABS
32.0000 mg | ORAL_TABLET | Freq: Every day | ORAL | Status: DC
  Administered 2017-05-23: 32 mg via ORAL
  Filled 2017-05-22: qty 4
  Filled 2017-05-22 (×2): qty 2
  Filled 2017-05-22 (×2): qty 4

## 2017-05-22 MED ORDER — LAMOTRIGINE 100 MG PO TABS
300.0000 mg | ORAL_TABLET | Freq: Every day | ORAL | Status: DC
  Administered 2017-05-22 – 2017-05-23 (×2): 300 mg via ORAL
  Filled 2017-05-22 (×2): qty 3

## 2017-05-22 MED ORDER — PANTOPRAZOLE SODIUM 40 MG PO TBEC
40.0000 mg | DELAYED_RELEASE_TABLET | Freq: Every day | ORAL | Status: DC
  Administered 2017-05-22 – 2017-05-23 (×2): 40 mg via ORAL
  Filled 2017-05-22 (×2): qty 1

## 2017-05-22 MED ORDER — GABAPENTIN 300 MG PO CAPS
900.0000 mg | ORAL_CAPSULE | Freq: Every day | ORAL | Status: DC
  Administered 2017-05-22 – 2017-05-23 (×2): 900 mg via ORAL
  Filled 2017-05-22 (×2): qty 3

## 2017-05-22 MED ORDER — ASENAPINE MALEATE 5 MG SL SUBL
20.0000 mg | SUBLINGUAL_TABLET | Freq: Every evening | SUBLINGUAL | Status: DC
  Administered 2017-05-23: 20 mg via SUBLINGUAL
  Filled 2017-05-22 (×3): qty 4
  Filled 2017-05-22: qty 2

## 2017-05-22 NOTE — H&P (Signed)
She is brought in with COPD exacerbation. Complete dictation to follow.

## 2017-05-22 NOTE — Progress Notes (Signed)
Subjective:    Patient ID: Alicia Dixon, female    DOB: 16-Apr-1966, 51 y.o.   MRN: [id number]  HPI  51 y/o female with pmh of COPD on supplemental O2, HTN, coagulopathy, bipolar disorderTIA who presents for follow up for b/l low back pain R>L.  Initially patient stated: Started ~10 years ago, getting progressively worse.  No alleviating factors.  Prolonged postures exacerbate the pain.  It is sharp and dull, constant.  Non-radiating. Today, severity is 10/10.  Denies associated numbness, tingling weakness. She has tried IBU, percocet, tylenol without benefit. She was seeing a previous pain management injections, she had trigger point injection, which helped.  She has had falls because of knee instability.  Pain limits activities of daily living.    Last clinic visit 03/21/17. At that time she received trigger point injections.  Since that time. She states she continues to take her medications as prescribed. However, on today's visit she was noted to have passed out in the waiting room. She was brought in to be evaluated. She states that she was recently started on a new blood pressure medication and was in the hospital for COPD exacerbation. She did not bring her portable oxygen with her today because she states the unit is broken and she is going to receive another one tonight. Her blood pressure is noted to be low and her heart rate is noted to be elevated. She also admits that she has not been drinking much water. Patient does not want to go to her PCP or local hospital.   Pain Inventory Average Pain 8 Pain Right Now 9 My pain is constant, sharp, dull and aching  In the last 24 hours, has pain interfered with the following? General activity 3 Relation with others 7 Enjoyment of life 8 What TIME of day is your pain at its worst? morning,daytime, night Sleep (in general) Poor  Pain is worse with: walking, sitting and standing Pain improves with: rest and therapy/exercise Relief from  Meds: 9  Mobility walk without assistance how many minutes can you walk? 5 ability to climb steps?  no do you drive?  yes  Function Do you have any goals in this area?  no  Neuro/Psych trouble walking dizziness depression anxiety  Prior Studies Any changes since last visit?  no  Physicians involved in your care Primary care Veverly FellsJenny Bachman, [facility]   Family History  Problem Relation Age of Onset  . Colon cancer Maternal Aunt    Social History   Social History  . Marital status: Divorced    Spouse name: N/A  . Number of children: N/A  . Years of education: N/A   Social History Main Topics  . Smoking status: Current Every Day Smoker    Packs/day: 0.25    Years: 35.00    Types: Cigarettes  . Smokeless tobacco: Never Used  . Alcohol use No  . Drug use: No  . Sexual activity: Yes    Birth control/ protection: Surgical   Other Topics Concern  . None   Social History Narrative  . None   Past Surgical History:  Procedure Laterality Date  . ABDOMINAL HYSTERECTOMY    . APPENDECTOMY    . CESAREAN SECTION    . CHOLECYSTECTOMY    . LUNG LOBECTOMY     right lung    Past Medical History:  Diagnosis Date  . Asthma   . Bipolar disorder (HCC)   . COPD (chronic obstructive pulmonary disease) (HCC)   .  Depression   . Stroke (HCC)    BP (!) 89/63 (BP Location: Right Arm, Patient Position: Sitting, Cuff Size: Normal)   Pulse 97   Resp 14   SpO2 92%   Opioid Risk Score:   Fall Risk Score:  `1  Depression screen PHQ 2/9  Depression screen [facility] 2/9 01/11/2017 06/07/2016 05/25/2016  Decreased Interest 1 1 2   Down, Depressed, Hopeless 1 1 0  PHQ - 2 Score 2 2 2   Altered sleeping - - 3  Tired, decreased energy - - 3  Change in appetite - - 1  Feeling bad or failure about yourself  - - 2  Trouble concentrating - - 0  Moving slowly or fidgety/restless - - 1  Suicidal thoughts - - 0  PHQ-9 Score - - 12    Review of Systems  Constitutional: Negative.   HENT:  Negative.   Eyes: Negative.   Respiratory: Positive for cough, shortness of breath and wheezing.        Oxygen therapy Respiratory infections   Cardiovascular: Negative.   Gastrointestinal: Negative.   Endocrine: Negative.   Genitourinary: Negative.   Musculoskeletal: Positive for arthralgias and gait problem.  Skin: Negative.   Allergic/Immunologic: Negative.   Neurological: Positive for dizziness and light-headedness.       Syncope  Hematological: Negative.   Psychiatric/Behavioral: Positive for dysphoric mood. The patient is nervous/anxious.   All other systems reviewed and are negative.     Objective:   Physical Exam  Gen: NAD. Vital signs reviewed HENT: Normocephalic, Atraumatic. Facial edema Eyes: EOMI. No discharge.  Cardio: + Tachycardic, regular rhythm. No JVD. Pulm: B/l clear to auscultation.  Effort normal. +Supplemental O2. Abd: Soft, BS+ MSK:  Gait not evaluated today due to concerns for syncope.   TTP R>L lumbar paraspinal, gluteal muscles.  No edema.   Neg fortin finger Neuro: Sensation intact to light touch in all LE dermatomes  Strength  4/5 in all LLE myotomes     4-/5 in all RLE myotomes Skin: Warm and Dry. Intact.    Assessment & Plan:  51 y/o female with pmh of COPD on supplemental O2, HTN, coagulopathy, bipolar disorder, TIA who presents for follow up of b/l low back pain R>L.   1. Mechanical R>L low back pain  She states she does not have access and a phobia to pools  Cont Heat/Cold   Cont HEP, completed PT  Cont TENS  Cont Mobic 15mg  daily with food, educated pt not to take other NSAIDs concomitantly. Encouraged patient to hold off on medication until further evaluation.  Cont Tizanidine 2mg  BID PRN. Encouraged patient to hold off on medication until further evaluation  Will consider bracing in future  2. COPD  On supplemental O2, 2L. at present. encouraged patient on to leave house when portable oxygen available.  Will be weary with  benzo/TCA/narcotic administration  3. Sleep disturbance  Due to pain  Cont meds per Psych and Tizanidine  4. Myalgia  Will hold off on trigger point injections today  5. Tobacco abuse  Educated on continuing to cut back  6. Bipolar disorder  Managed by Psych - pt on Gabapentin qHS, Xanax, and lamictal  7. Abnormality of gait  Cont cane, encouraged compliance  8. Syncope  Recurrent over the last month. Low blood pressure, elevated heart rate, low O2 sats. Patient also noted to have facial edema. She reports increase in stuttering as she did when she had a previous stroke. Encouraged patient to go to  ED for evaluation. She refuses to go to a nearby facility and would like to go to [place]. Patient also refusing to go to PCP. Instructed patient not to drive and she states her friend will take her immediately to the urgent care.

## 2017-05-23 DIAGNOSIS — Z79899 Other long term (current) drug therapy: Secondary | ICD-10-CM | POA: Diagnosis not present

## 2017-05-23 DIAGNOSIS — F17210 Nicotine dependence, cigarettes, uncomplicated: Secondary | ICD-10-CM | POA: Diagnosis present

## 2017-05-23 DIAGNOSIS — Z7952 Long term (current) use of systemic steroids: Secondary | ICD-10-CM | POA: Diagnosis not present

## 2017-05-23 DIAGNOSIS — Z90710 Acquired absence of both cervix and uterus: Secondary | ICD-10-CM | POA: Diagnosis not present

## 2017-05-23 DIAGNOSIS — Z9049 Acquired absence of other specified parts of digestive tract: Secondary | ICD-10-CM | POA: Diagnosis not present

## 2017-05-23 DIAGNOSIS — J9811 Atelectasis: Secondary | ICD-10-CM | POA: Diagnosis present

## 2017-05-23 DIAGNOSIS — Z902 Acquired absence of lung [part of]: Secondary | ICD-10-CM | POA: Diagnosis not present

## 2017-05-23 DIAGNOSIS — J441 Chronic obstructive pulmonary disease with (acute) exacerbation: Secondary | ICD-10-CM | POA: Diagnosis present

## 2017-05-23 DIAGNOSIS — J9621 Acute and chronic respiratory failure with hypoxia: Secondary | ICD-10-CM | POA: Diagnosis present

## 2017-05-23 DIAGNOSIS — Z9109 Other allergy status, other than to drugs and biological substances: Secondary | ICD-10-CM | POA: Diagnosis not present

## 2017-05-23 DIAGNOSIS — Z885 Allergy status to narcotic agent status: Secondary | ICD-10-CM | POA: Diagnosis not present

## 2017-05-23 DIAGNOSIS — R569 Unspecified convulsions: Secondary | ICD-10-CM | POA: Diagnosis present

## 2017-05-23 DIAGNOSIS — Z825 Family history of asthma and other chronic lower respiratory diseases: Secondary | ICD-10-CM | POA: Diagnosis not present

## 2017-05-23 DIAGNOSIS — Z883 Allergy status to other anti-infective agents status: Secondary | ICD-10-CM | POA: Diagnosis not present

## 2017-05-23 DIAGNOSIS — Z8673 Personal history of transient ischemic attack (TIA), and cerebral infarction without residual deficits: Secondary | ICD-10-CM | POA: Diagnosis not present

## 2017-05-23 DIAGNOSIS — F3162 Bipolar disorder, current episode mixed, moderate: Secondary | ICD-10-CM | POA: Diagnosis present

## 2017-05-23 MED ORDER — ENOXAPARIN SODIUM 60 MG/0.6ML ~~LOC~~ SOLN
50.0000 mg | SUBCUTANEOUS | Status: DC
  Administered 2017-05-23: 50 mg via SUBCUTANEOUS
  Filled 2017-05-23: qty 0.6

## 2017-05-23 MED ORDER — ALUM & MAG HYDROXIDE-SIMETH 200-200-20 MG/5ML PO SUSP
30.0000 mL | Freq: Four times a day (QID) | ORAL | Status: DC | PRN
  Administered 2017-05-23: 30 mL via ORAL
  Filled 2017-05-23: qty 30

## 2017-05-23 NOTE — H&P (Signed)
EDNA REDE MRN: [id number] DOB/AGE: Jun 27, 1966 51 y.o. Primary Care Physician:Baucom, Morton Stall Admit date: 05/22/2017 Chief Complaint: Shortness of breath HPI: This is a 51 year old who was sent from her primary care office to my office because of shortness of breath. She has severe COPD. She has had a previous stroke. She has bipolar disease. She's coughing and congested. She's coughing up sputum. She's not had definite fever but she feels chilly. Her sputum is discolored. She has some chest discomfort with cough. No abdominal pain nausea vomiting or diarrhea. She's had some headache. She has nasal drainage. No swelling of her legs but she has had some swelling of her face and there was some concern that she might have heart failure but she's also on chronic prednisone.  Past Medical History:  Diagnosis Date  . Asthma   . Bipolar disorder (HCC)   . COPD (chronic obstructive pulmonary disease) (HCC)   . Depression   . Stroke [facility])    Past Surgical History:  Procedure Laterality Date  . ABDOMINAL HYSTERECTOMY    . APPENDECTOMY    . CESAREAN SECTION    . CHOLECYSTECTOMY    . LUNG LOBECTOMY     right lung         Family History  Problem Relation Age of Onset  . Colon cancer Maternal Aunt    There is a positive family history of COPD Social History:  reports that she has been smoking Cigarettes.  She has a 8.75 pack-year smoking history. She has never used smokeless tobacco. She reports that she does not drink alcohol or use drugs.   Allergies:  Allergies  Allergen Reactions  . Other Anaphylaxis    Allergic to melons  . Baclofen     hallucinations  . Flonase  [Fluticasone Propionate] Other (See Comments)    Nose swelling and nose bleeds  . Gadopentetate Dimeglumine Other (See Comments)    "purple" discoloration, slight numbness to left fifth finger, "purple dots" on the top of left hand following MRI exam 07/22/13 with Magnevist contrast.  Radiologist did not feel  this was related to contrast.  . Lortab [Hydrocodone-Acetaminophen]     N/v hives  . Seroquel [Quetiapine Fumarate]     n/v  . Sudafed [Pseudoephedrine Hcl]     blisters  . Trazodone And Nefazodone Nausea And Vomiting    Medications Prior to Admission  Medication Sig Dispense Refill  . ALPRAZolam (XANAX) 1 MG tablet Take 1 mg by mouth 4 (four) times daily.  3  . atorvastatin (LIPITOR) 20 MG tablet Take 20 mg by mouth at bedtime.  1  . cetirizine (ZYRTEC) 10 MG tablet Take 10 mg by mouth daily.    Marland Kitchen doxepin (SINEQUAN) 75 MG capsule take 1 capsule at bedtime  1  . DULERA 200-5 MCG/ACT AERO INL 2 PFS PO BID  12  . gabapentin (NEURONTIN) 300 MG capsule Take 900 mg by mouth at bedtime.  2  . ipratropium-albuterol (DUONEB) 0.5-2.5 (3) MG/3ML SOLN Take 3 mLs by nebulization every 6 (six) hours as needed (shortness of breath).     Marland Kitchen ipratropium-albuterol (DUONEB) 0.5-2.5 (3) MG/3ML SOLN Take 3 mLs by nebulization 4 (four) times daily. 360 mL 12  . lamoTRIgine (LAMICTAL) 150 MG tablet Take 300 mg by mouth at bedtime.  3  . meloxicam (MOBIC) 15 MG tablet TAKE 1 TABLET(15 MG) BY MOUTH DAILY 30 tablet 0  . mirtazapine (REMERON) 45 MG tablet Take 45 mg by mouth at bedtime.  3  .  montelukast (SINGULAIR) 10 MG tablet Take 10 mg by mouth every evening.  2  . pantoprazole (PROTONIX) 40 MG tablet Take 1 tablet by mouth at bedtime.    Marland Kitchen. perphenazine (TRILAFON) 8 MG tablet Take 32 mg by mouth at bedtime.   3  . predniSONE (DELTASONE) 10 MG tablet Take 10 mg by mouth daily.   5  . predniSONE (DELTASONE) 10 MG tablet Take 1 tablet (10 mg total) by mouth daily. 6 daily for 3 days, 5 daily for 3 days, 4 daily for 3 days, 3 daily for 3 days, 2 daily for 3 days and then 1 daily 100 tablet 0  . PROAIR HFA 108 (90 Base) MCG/ACT inhaler Inhale 2 puffs into the lungs every 4 (four) hours as needed. Shortness of breath  2  . propranolol (INDERAL) 10 MG tablet Take 1 tablet by mouth 2 (two) times daily.  1  . SAPHRIS  10 MG SUBL Place 20 mg under the tongue every evening.   3  . tiZANidine (ZANAFLEX) 2 MG tablet TAKE 1 TABLET(2 MG) BY MOUTH EVERY 8 HOURS AS NEEDED FOR MUSCLE SPASMS 90 tablet 2  . guaiFENesin (MUCINEX) 600 MG 12 hr tablet Take 1 tablet (600 mg total) by mouth 2 (two) times daily. 20 tablet 2       EAV:WUJWJROS:apart from the symptoms mentioned above,there are no other symptoms referable to all systems reviewed.10 point review of systems otherwise negative  Physical Exam: Blood pressure (!) 141/91, pulse (!) 102, temperature 98.7 F (37.1 C), temperature source Oral, resp. rate 20, height 5\' 8"  (1.727 m), weight 103.4 kg (227 lb 14.4 oz), SpO2 (!) 87 %. Constitutional: She is awake and alert and in no acute distress but coughing and congested. Eyes: Pupils reactive. Ears nose mouth and throat: She is edentulous. Hearing is grossly normal. Cardiovascular: Her heart is regular with normal heart sounds. Respiratory: She has bilateral wheezing and rhonchi. Gastrointestinal: Her abdomen is soft with no masses. Skin: Warm and dry. Musculoskeletal: Normal strength. Neurological: No focal abnormalities now. Psychiatric: She is mildly anxious    Recent Labs  05/22/17 1914  WBC 12.6*  NEUTROABS 8.6*  HGB 15.4*  HCT 48.5*  MCV 96.2  PLT 326    Recent Labs  05/22/17 1914  NA 144  K 3.6  CL 106  CO2 30  GLUCOSE 98  BUN 8  CREATININE 0.68  CALCIUM 9.0  lablast2(ast:2,ALT:2,alkphos:2,bilitot:2,prot:2,albumin:2)@    No results found for this or any previous visit (from the past 240 hour(s)).   X-ray Chest Pa And Lateral  Result Date: 05/22/2017 CLINICAL DATA:  COPD.  Acute on chronic shortness of breath. EXAM: CHEST  2 VIEW COMPARISON:  Radiograph 04/17/2017, CT 03/13/2017 FINDINGS: Unchanged heart size and mediastinal contours. Emphysema with postsurgical volume loss in the right lung. Worsening bibasilar atelectasis and increasing bronchial thickening. No confluent airspace disease. No  pleural effusion or pneumothorax. Stable osseous structures. IMPRESSION: 1. Worsening bibasilar atelectasis and progressive bronchial thickening. 2. Emphysema. Electronically Signed   By: Rubye OaksMelanie  Ehinger M.D.   On: 05/22/2017 23:09   Impression: She has COPD exacerbation. This is one of multiple episodes. She has acute on chronic hypoxic respiratory failure. Active Problems:   COPD (chronic obstructive pulmonary disease) (HCC)     Plan: Admit for IV treatment      Shaddai Shapley L   05/23/2017, 9:03 AM

## 2017-05-23 NOTE — Progress Notes (Signed)
Subjective: She says she feels a little better. She is still short of breath. She still coughing up sputum.  Objective: Vital signs in last 24 hours: Temp:  [98.1 F (36.7 C)-98.8 F (37.1 C)] 98.7 F (37.1 C) (08/16 0500) Pulse Rate:  [101-102] 102 (08/16 0500) Resp:  [18-20] 20 (08/16 0500) BP: (112-143)/(63-91) 141/91 (08/16 0500) SpO2:  [87 %-96 %] 87 % (08/16 0750) Weight:  [103.4 kg (227 lb 14.4 oz)] 103.4 kg (227 lb 14.4 oz) (08/15 1737) Weight change:  Last BM Date: 05/21/17  Intake/Output from previous day: 08/15 0701 - 08/16 0700 In: 100 [IV Piggyback:100] Out: 300 [Urine:300]  PHYSICAL EXAM General appearance: alert, cooperative and mild distress Resp: rhonchi bilaterally and wheezes bilaterally Cardio: regular rate and rhythm, S1, S2 normal, no murmur, click, rub or gallop GI: soft, non-tender; bowel sounds normal; no masses,  no organomegaly Extremities: extremities normal, atraumatic, no cyanosis or edema Mucous membranes are moist  Lab Results:  Results for orders placed or performed during the hospital encounter of 05/22/17 (from the past 48 hour(s))  Comprehensive metabolic panel     Status: Abnormal   Collection Time: 05/22/17  7:14 PM  Result Value Ref Range   Sodium 144 135 - 145 mmol/L   Potassium 3.6 3.5 - 5.1 mmol/L   Chloride 106 101 - 111 mmol/L   CO2 30 22 - 32 mmol/L   Glucose, Bld 98 65 - 99 mg/dL   BUN 8 6 - 20 mg/dL   Creatinine, Ser 0.68 0.44 - 1.00 mg/dL   Calcium 9.0 8.9 - 10.3 mg/dL   Total Protein 6.6 6.5 - 8.1 g/dL   Albumin 3.4 (L) 3.5 - 5.0 g/dL   AST 27 15 - 41 U/L   ALT 35 14 - 54 U/L   Alkaline Phosphatase 111 38 - 126 U/L   Total Bilirubin 0.5 0.3 - 1.2 mg/dL   GFR calc non Af Amer >60 >60 mL/min   GFR calc Af Amer >60 >60 mL/min    Comment: (NOTE) The eGFR has been calculated using the CKD EPI equation. This calculation has not been validated in all clinical situations. eGFR's persistently <60 mL/min signify possible  Chronic Kidney Disease.    Anion gap 8 5 - 15  CBC WITH DIFFERENTIAL     Status: Abnormal   Collection Time: 05/22/17  7:14 PM  Result Value Ref Range   WBC 12.6 (H) 4.0 - 10.5 K/uL   RBC 5.04 3.87 - 5.11 MIL/uL   Hemoglobin 15.4 (H) 12.0 - 15.0 g/dL   HCT 48.5 (H) 36.0 - 46.0 %   MCV 96.2 78.0 - 100.0 fL   MCH 30.6 26.0 - 34.0 pg   MCHC 31.8 30.0 - 36.0 g/dL   RDW 15.3 11.5 - 15.5 %   Platelets 326 150 - 400 K/uL   Neutrophils Relative % 67 %   Neutro Abs 8.6 (H) 1.7 - 7.7 K/uL   Lymphocytes Relative 22 %   Lymphs Abs 2.7 0.7 - 4.0 K/uL   Monocytes Relative 10 %   Monocytes Absolute 1.2 (H) 0.1 - 1.0 K/uL   Eosinophils Relative 1 %   Eosinophils Absolute 0.1 0.0 - 0.7 K/uL   Basophils Relative 0 %   Basophils Absolute 0.0 0.0 - 0.1 K/uL  Brain natriuretic peptide     Status: None   Collection Time: 05/22/17  7:23 PM  Result Value Ref Range   B Natriuretic Peptide 47.0 0.0 - 100.0 pg/mL    ABGS  No results for input(s): PHART, PO2ART, TCO2, HCO3 in the last 72 hours.  Invalid input(s): PCO2 CULTURES No results found for this or any previous visit (from the past 240 hour(s)). Studies/Results: X-ray Chest Pa And Lateral  Result Date: 05/22/2017 CLINICAL DATA:  COPD.  Acute on chronic shortness of breath. EXAM: CHEST  2 VIEW COMPARISON:  Radiograph 04/17/2017, CT 03/13/2017 FINDINGS: Unchanged heart size and mediastinal contours. Emphysema with postsurgical volume loss in the right lung. Worsening bibasilar atelectasis and increasing bronchial thickening. No confluent airspace disease. No pleural effusion or pneumothorax. Stable osseous structures. IMPRESSION: 1. Worsening bibasilar atelectasis and progressive bronchial thickening. 2. Emphysema. Electronically Signed   By: Jeb Levering M.D.   On: 05/22/2017 23:09    Medications:  Prior to Admission:  Prescriptions Prior to Admission  Medication Sig Dispense Refill Last Dose  . ALPRAZolam (XANAX) 1 MG tablet Take 1 mg  by mouth 4 (four) times daily.  3 Past Week at Unknown time  . atorvastatin (LIPITOR) 20 MG tablet Take 20 mg by mouth at bedtime.  1 05/21/2017 at Unknown time  . cetirizine (ZYRTEC) 10 MG tablet Take 10 mg by mouth daily.   05/22/2017 at Unknown time  . doxepin (SINEQUAN) 75 MG capsule take 1 capsule at bedtime  1 05/21/2017 at Unknown time  . DULERA 200-5 MCG/ACT AERO INL 2 PFS PO BID  12 05/22/2017 at Unknown time  . gabapentin (NEURONTIN) 300 MG capsule Take 900 mg by mouth at bedtime.  2 05/21/2017 at Unknown time  . ipratropium-albuterol (DUONEB) 0.5-2.5 (3) MG/3ML SOLN Take 3 mLs by nebulization every 6 (six) hours as needed (shortness of breath).    05/21/2017 at Unknown time  . ipratropium-albuterol (DUONEB) 0.5-2.5 (3) MG/3ML SOLN Take 3 mLs by nebulization 4 (four) times daily. 360 mL 12 05/21/2017 at Unknown time  . lamoTRIgine (LAMICTAL) 150 MG tablet Take 300 mg by mouth at bedtime.  3 05/21/2017 at Unknown time  . meloxicam (MOBIC) 15 MG tablet TAKE 1 TABLET(15 MG) BY MOUTH DAILY 30 tablet 0 05/21/2017 at Unknown time  . mirtazapine (REMERON) 45 MG tablet Take 45 mg by mouth at bedtime.  3 05/21/2017 at Unknown time  . montelukast (SINGULAIR) 10 MG tablet Take 10 mg by mouth every evening.  2 05/21/2017 at Unknown time  . pantoprazole (PROTONIX) 40 MG tablet Take 1 tablet by mouth at bedtime.   05/22/2017 at Unknown time  . perphenazine (TRILAFON) 8 MG tablet Take 32 mg by mouth at bedtime.   3 05/21/2017 at Unknown time  . predniSONE (DELTASONE) 10 MG tablet Take 10 mg by mouth daily.   5 05/22/2017 at Unknown time  . predniSONE (DELTASONE) 10 MG tablet Take 1 tablet (10 mg total) by mouth daily. 6 daily for 3 days, 5 daily for 3 days, 4 daily for 3 days, 3 daily for 3 days, 2 daily for 3 days and then 1 daily 100 tablet 0 05/22/2017 at Unknown time  . PROAIR HFA 108 (90 Base) MCG/ACT inhaler Inhale 2 puffs into the lungs every 4 (four) hours as needed. Shortness of breath  2 05/21/2017 at Unknown  time  . propranolol (INDERAL) 10 MG tablet Take 1 tablet by mouth 2 (two) times daily.  1 05/22/2017 at Unknown time  . SAPHRIS 10 MG SUBL Place 20 mg under the tongue every evening.   3 05/21/2017 at Unknown time  . tiZANidine (ZANAFLEX) 2 MG tablet TAKE 1 TABLET(2 MG) BY MOUTH EVERY 8 HOURS AS  NEEDED FOR MUSCLE SPASMS 90 tablet 2 05/22/2017 at Unknown time  . guaiFENesin (MUCINEX) 600 MG 12 hr tablet Take 1 tablet (600 mg total) by mouth 2 (two) times daily. 20 tablet 2 Taking   Scheduled: . ALPRAZolam  1 mg Oral QID  . asenapine  20 mg Sublingual QPM  . atorvastatin  20 mg Oral QHS  . doxepin  75 mg Oral QHS  . enoxaparin (LOVENOX) injection  40 mg Subcutaneous Q24H  . gabapentin  900 mg Oral QHS  . guaiFENesin  600 mg Oral BID  . ipratropium-albuterol  3 mL Nebulization QID  . lamoTRIgine  300 mg Oral QHS  . loratadine  10 mg Oral Daily  . meloxicam  15 mg Oral Daily  . methylPREDNISolone (SOLU-MEDROL) injection  60 mg Intravenous Q6H  . mirtazapine  45 mg Oral QHS  . mometasone-formoterol  2 puff Inhalation BID  . montelukast  10 mg Oral QPM  . pantoprazole  40 mg Oral QHS  . perphenazine  32 mg Oral QHS  . sodium chloride flush  3 mL Intravenous Q12H   Continuous: . sodium chloride    . levofloxacin (LEVAQUIN) IV Stopped (05/22/17 2029)   OMB:TDHRCB chloride, acetaminophen **OR** acetaminophen, sodium chloride flush, tiZANidine  Assesment: She was brought in with COPD exacerbation. She has acute on chronic hypoxic respiratory failure. Chest x-ray that I personally reviewed looks more like atelectasis and peribronchial thickening but not pneumonia. Active Problems:   COPD (chronic obstructive pulmonary disease) ([place])    Plan: Continue current treatments    LOS: 0 days   Conswella Bruney L 05/23/2017, 9:06 AM

## 2017-05-24 MED ORDER — LEVOFLOXACIN 500 MG PO TABS: 500.0000 mg | ORAL_TABLET | Freq: Every day | ORAL | 0 refills | Status: AC

## 2017-05-24 NOTE — Progress Notes (Signed)
Pt D/C home today via w/c.  Spouse is at bedside.  Pt states she is going to make a f/u with Dr. Juanetta Gosling for a one week hospital f/u.  Levaquin RX was E-Prescribed per Dr. Juanetta Gosling.  Pt verbalized understanding that she can pick up RX on her way home.  All questions/concerns addressed.  She is d/c in stable condition with no complaints.

## 2017-05-24 NOTE — Care Management Note (Addendum)
Case Management Note  Patient Details  Name: Alicia Dixon MRN: [id number] Date of Birth: [birth year]  Subjective/Objective:                  Admitted with COPD exacerbation. Pt from home, with lives family. She is ind with ADL's, has PCP, transportation and insurance with drug coverage. Pt has home oxygen and neb machine pta. No difficulty affording or managing medications, no needs communicated. Pt with 4 admission is last 6 months. No homebound and not a candidate for [facility].   Action/Plan: Discharging home today with self care  Expected Discharge Date:  05/24/17               Expected Discharge Plan:  Home/Self Care  In-House Referral:  NA  Discharge planning Services  CM Consult  Post Acute Care Choice:  NA Choice offered to:  NA  Status of Service:  Completed, signed off   Malcolm Metro, RN 05/24/2017, 9:02 AM

## 2017-05-24 NOTE — Discharge Summary (Signed)
Physician Discharge Summary  Patient ID: Alicia Dixon MRN: [id number] DOB/AGE: [birth year] 51 y.o. Primary Care Physician:Baucom, Shaune Pollack, PA-C Admit date: 05/22/2017 Discharge date: 05/24/2017    Discharge Diagnoses:   Active Problems:   COPD (chronic obstructive pulmonary disease) (HCC) Personal history of stroke with current residual effects GERD Hypertension COPD exacerbation Bipolar 1 disorder mixed, moderate Acute on chronic hypoxic respiratory failure Allergies as of 05/24/2017      Reactions   Other Anaphylaxis   Allergic to melons   Baclofen    hallucinations   Flonase  [fluticasone Propionate] Other (See Comments)   Nose swelling and nose bleeds   Gadopentetate Dimeglumine Other (See Comments)   "purple" discoloration, slight numbness to left fifth finger, "purple dots" on the top of left hand following MRI exam 07/22/13 with Magnevist contrast.  Radiologist did not feel this was related to contrast.   Lortab [hydrocodone-acetaminophen]    N/v hives   Seroquel [quetiapine Fumarate]    n/v   Sudafed [pseudoephedrine Hcl]    blisters   Trazodone And Nefazodone Nausea And Vomiting      Medication List    TAKE these medications   ALPRAZolam 1 MG tablet Commonly known as:  XANAX Take 1 mg by mouth 4 (four) times daily.   atorvastatin 20 MG tablet Commonly known as:  LIPITOR Take 20 mg by mouth at bedtime.   cetirizine 10 MG tablet Commonly known as:  ZYRTEC Take 10 mg by mouth daily.   doxepin 75 MG capsule Commonly known as:  SINEQUAN take 1 capsule at bedtime   DULERA 200-5 MCG/ACT Aero Generic drug:  mometasone-formoterol INL 2 PFS PO BID   gabapentin 300 MG capsule Commonly known as:  NEURONTIN Take 900 mg by mouth at bedtime.   guaiFENesin 600 MG 12 hr tablet Commonly known as:  MUCINEX Take 1 tablet (600 mg total) by mouth 2 (two) times daily.   ipratropium-albuterol 0.5-2.5 (3) MG/3ML Soln Commonly known as:  DUONEB Take 3 mLs by  nebulization every 6 (six) hours as needed (shortness of breath).   ipratropium-albuterol 0.5-2.5 (3) MG/3ML Soln Commonly known as:  DUONEB Take 3 mLs by nebulization 4 (four) times daily.   lamoTRIgine 150 MG tablet Commonly known as:  LAMICTAL Take 300 mg by mouth at bedtime.   levofloxacin 500 MG tablet Commonly known as:  LEVAQUIN Take 1 tablet (500 mg total) by mouth daily.   meloxicam 15 MG tablet Commonly known as:  MOBIC TAKE 1 TABLET(15 MG) BY MOUTH DAILY   mirtazapine 45 MG tablet Commonly known as:  REMERON Take 45 mg by mouth at bedtime.   montelukast 10 MG tablet Commonly known as:  SINGULAIR Take 10 mg by mouth every evening.   pantoprazole 40 MG tablet Commonly known as:  PROTONIX Take 1 tablet by mouth at bedtime.   perphenazine 8 MG tablet Commonly known as:  TRILAFON Take 32 mg by mouth at bedtime.   predniSONE 10 MG tablet Commonly known as:  DELTASONE Take 10 mg by mouth daily.   predniSONE 10 MG tablet Commonly known as:  DELTASONE Take 1 tablet (10 mg total) by mouth daily. 6 daily for 3 days, 5 daily for 3 days, 4 daily for 3 days, 3 daily for 3 days, 2 daily for 3 days and then 1 daily   PROAIR HFA 108 (90 Base) MCG/ACT inhaler Generic drug:  albuterol Inhale 2 puffs into the lungs every 4 (four) hours as needed. Shortness of breath  propranolol 10 MG tablet Commonly known as:  INDERAL Take 1 tablet by mouth 2 (two) times daily.   SAPHRIS 10 MG Subl Generic drug:  Asenapine Maleate Place 20 mg under the tongue every evening.   tiZANidine 2 MG tablet Commonly known as:  ZANAFLEX TAKE 1 TABLET(2 MG) BY MOUTH EVERY 8 HOURS AS NEEDED FOR MUSCLE SPASMS       Discharged Condition:Improved    Consults: None  Significant Diagnostic Studies: X-ray Chest Pa And Lateral  Result Date: 05/22/2017 CLINICAL DATA:  COPD.  Acute on chronic shortness of breath. EXAM: CHEST  2 VIEW COMPARISON:  Radiograph 04/17/2017, CT 03/13/2017 FINDINGS:  Unchanged heart size and mediastinal contours. Emphysema with postsurgical volume loss in the right lung. Worsening bibasilar atelectasis and increasing bronchial thickening. No confluent airspace disease. No pleural effusion or pneumothorax. Stable osseous structures. IMPRESSION: 1. Worsening bibasilar atelectasis and progressive bronchial thickening. 2. Emphysema. Electronically Signed   By: Rubye Oaks M.D.   On: 05/22/2017 23:09    Lab Results: Basic Metabolic Panel:  Recent Labs  09/81/19 1914  NA 144  K 3.6  CL 106  CO2 30  GLUCOSE 98  BUN 8  CREATININE 0.68  CALCIUM 9.0   Liver Function Tests:  Recent Labs  05/22/17 1914  AST 27  ALT 35  ALKPHOS 111  BILITOT 0.5  PROT 6.6  ALBUMIN 3.4*     CBC:  Recent Labs  05/22/17 1914  WBC 12.6*  NEUTROABS 8.6*  HGB 15.4*  HCT 48.5*  MCV 96.2  PLT 326    No results found for this or any previous visit (from the past 240 hour(s)).   Hospital Course: This is a 51 year old who was sent to my office from her primary care physician's office because of increasing shortness of breath. When I saw her in my office she was wheezing coughing and in mild respiratory distress. She was admitted started on IV fluids and IV antibiotics inhale bronchodilators continued on oxygen and improved. She was ready for discharge on the 17th back at baseline.  Discharge Exam: Blood pressure 133/68, pulse (!) 120, temperature 98 F (36.7 C), temperature source Oral, resp. rate 20, height 5\' 8"  (1.727 m), weight 103.4 kg (227 lb 14.4 oz), SpO2 93 %. She is awake and alert. Chest is clear now. Heart is regular.  Disposition: Home.  Discharge Instructions    Diet - low sodium heart healthy    Complete by:  As directed    Increase activity slowly    Complete by:  As directed         Signed: Antonio Creswell L   05/24/2017, 8:40 AM

## 2017-06-05 ENCOUNTER — Encounter: Payer: Self-pay | Admitting: Physical Medicine & Rehabilitation

## 2017-06-05 ENCOUNTER — Encounter (HOSPITAL_BASED_OUTPATIENT_CLINIC_OR_DEPARTMENT_OTHER): Payer: Medicare Other | Admitting: Physical Medicine & Rehabilitation

## 2017-06-05 VITALS — BP 110/82 | HR 112

## 2017-06-05 DIAGNOSIS — J449 Chronic obstructive pulmonary disease, unspecified: Secondary | ICD-10-CM | POA: Diagnosis not present

## 2017-06-05 DIAGNOSIS — F319 Bipolar disorder, unspecified: Secondary | ICD-10-CM | POA: Diagnosis not present

## 2017-06-05 DIAGNOSIS — G479 Sleep disorder, unspecified: Secondary | ICD-10-CM | POA: Diagnosis not present

## 2017-06-05 DIAGNOSIS — M791 Myalgia, unspecified site: Secondary | ICD-10-CM

## 2017-06-05 DIAGNOSIS — Z720 Tobacco use: Secondary | ICD-10-CM

## 2017-06-05 DIAGNOSIS — R269 Unspecified abnormalities of gait and mobility: Secondary | ICD-10-CM | POA: Diagnosis not present

## 2017-06-05 DIAGNOSIS — G8929 Other chronic pain: Secondary | ICD-10-CM | POA: Diagnosis not present

## 2017-06-05 DIAGNOSIS — I10 Essential (primary) hypertension: Secondary | ICD-10-CM | POA: Diagnosis not present

## 2017-06-05 DIAGNOSIS — Z9981 Dependence on supplemental oxygen: Secondary | ICD-10-CM | POA: Diagnosis not present

## 2017-06-05 DIAGNOSIS — M545 Low back pain, unspecified: Secondary | ICD-10-CM

## 2017-06-05 NOTE — Progress Notes (Signed)
Subjective:    Patient ID: Alicia Dixon, female    DOB: 1966-06-07, 51 y.o.   MRN: [id number]  HPI  51 y/o female with pmh of COPD on supplemental O2, HTN, coagulopathy, bipolar disorderTIA who presents for follow up for b/l low back pain R>L.  Initially patient stated: Started ~10 years ago, getting progressively worse.  No alleviating factors.  Prolonged postures exacerbate the pain.  It is sharp and dull, constant.  Non-radiating. Today, severity is 10/10.  Denies associated numbness, tingling weakness. She has tried IBU, percocet, tylenol without benefit. She was seeing a previous pain management injections, she had trigger point injection, which helped.  She has had falls because of knee instability.  Pain limits activities of daily living.    Last clinic visit 05/22/17. Since last visit, pt was admitted to the hospital for dehydration and PNA.  She states she is doing better now. She restarted her medications.  Her portable O2 was fixed. Her syncopal episodes have resolved.    Pain Inventory Average Pain 7 Pain Right Now 7 My pain is constant, sharp, dull, stabbing and aching  In the last 24 hours, has pain interfered with the following? General activity 7 Relation with others 4 Enjoyment of life 5 What TIME of day is your pain at its worst? morning,daytime, night Sleep (in general) Poor  Pain is worse with: walking, sitting and standing Pain improves with: rest and therapy/exercise Relief from Meds: 4  Mobility walk without assistance how many minutes can you walk? 5 ability to climb steps?  no do you drive?  yes  Function Do you have any goals in this area?  no  Neuro/Psych trouble walking dizziness depression anxiety  Prior Studies Any changes since last visit?  no  Physicians involved in your care Primary care Veverly Fells, [facility]   Family History  Problem Relation Age of Onset  . Colon cancer Maternal Aunt    Social History   Social History  .  Marital status: Divorced    Spouse name: N/A  . Number of children: N/A  . Years of education: N/A   Social History Main Topics  . Smoking status: Current Every Day Smoker    Packs/day: 0.25    Years: 35.00    Types: Cigarettes  . Smokeless tobacco: Never Used  . Alcohol use No  . Drug use: No  . Sexual activity: Yes    Birth control/ protection: Surgical   Other Topics Concern  . Not on file   Social History Narrative  . No narrative on file   Past Surgical History:  Procedure Laterality Date  . ABDOMINAL HYSTERECTOMY    . APPENDECTOMY    . CESAREAN SECTION    . CHOLECYSTECTOMY    . LUNG LOBECTOMY     right lung    Past Medical History:  Diagnosis Date  . Asthma   . Bipolar disorder (HCC)   . COPD (chronic obstructive pulmonary disease) (HCC)   . Depression   . Stroke [facility])    There were no vitals taken for this visit.  Opioid Risk Score:   Fall Risk Score:  `1  Depression screen PHQ 2/9  Depression screen [facility] 2/9 01/11/2017 06/07/2016 05/25/2016  Decreased Interest 1 1 2   Down, Depressed, Hopeless 1 1 0  PHQ - 2 Score 2 2 2   Altered sleeping - - 3  Tired, decreased energy - - 3  Change in appetite - - 1  Feeling bad or failure about  yourself  - - 2  Trouble concentrating - - 0  Moving slowly or fidgety/restless - - 1  Suicidal thoughts - - 0  PHQ-9 Score - - 12    Review of Systems  Constitutional: Negative.   HENT: Negative.   Eyes: Negative.   Respiratory: Positive for cough, shortness of breath and wheezing.        Oxygen therapy Respiratory infections   Cardiovascular: Negative.   Gastrointestinal: Negative.   Endocrine: Negative.   Genitourinary: Negative.   Musculoskeletal: Positive for arthralgias and gait problem.  Skin: Negative.   Allergic/Immunologic: Negative.   Hematological: Negative.   Psychiatric/Behavioral: Positive for dysphoric mood. The patient is nervous/anxious.   All other systems reviewed and are negative.       Objective:   Physical Exam  Gen: NAD. Vital signs reviewed HENT: Normocephalic, Atraumatic. Facial edema improving Eyes: EOMI. No discharge.  Cardio: + Tachycardic, regular rhythm. No JVD. Pulm: B/l clear to auscultation.  Effort normal. +Supplemental O2. Abd: Soft, BS+ MSK:  Gait mildly antaglic  TTP R>L lumbar paraspinal, gluteal muscles.  No edema.   Neg fortin finger Neuro: Sensation intact to light touch in all LE dermatomes  Strength  4/5 in all LLE myotomes     4-/5 in all RLE myotomes Skin: Warm and Dry. Intact.    Assessment & Plan:  51 y/o female with pmh of COPD on supplemental O2, HTN, coagulopathy, bipolar disorder, TIA who presents for follow up of b/l low back pain R>L.   1. Mechanical R>L low back pain  She states she does not have access and a phobia to pools  Cont Heat/Cold   Cont HEP, completed PT  Cont TENS  Cont Mobic 15mg  daily with food, educated pt not to take other NSAIDs concomitantly.   Cont Tizanidine 2mg  BID PRN.   Will consider bracing in future  2. COPD  Cont supplemental O2, 2L  Will be weary with benzo/TCA/narcotic administration  3. Sleep disturbance  Due to pain  Cont meds per Psych and Tizanidine  4. Myalgia  Will perform on next visit  5. Tobacco abuse  Educated on continuing to cut back, currently 2/day  6. Bipolar disorder  Managed by Psych - pt on Gabapentin qHS, Xanax, and lamictal  7. Abnormality of gait  Cont cane, encouraged compliance  8. Syncope  Resolved

## 2017-06-21 ENCOUNTER — Encounter: Payer: Medicare Other | Admitting: Physical Medicine & Rehabilitation

## 2017-06-27 ENCOUNTER — Encounter: Payer: Medicare Other | Attending: Physical Medicine & Rehabilitation | Admitting: Physical Medicine & Rehabilitation

## 2017-06-27 VITALS — BP 108/74 | HR 103

## 2017-06-27 DIAGNOSIS — F319 Bipolar disorder, unspecified: Secondary | ICD-10-CM | POA: Diagnosis not present

## 2017-06-27 DIAGNOSIS — M791 Myalgia, unspecified site: Secondary | ICD-10-CM

## 2017-06-27 DIAGNOSIS — Z720 Tobacco use: Secondary | ICD-10-CM | POA: Insufficient documentation

## 2017-06-27 DIAGNOSIS — R269 Unspecified abnormalities of gait and mobility: Secondary | ICD-10-CM | POA: Insufficient documentation

## 2017-06-27 DIAGNOSIS — M545 Low back pain: Secondary | ICD-10-CM | POA: Insufficient documentation

## 2017-06-27 DIAGNOSIS — G479 Sleep disorder, unspecified: Secondary | ICD-10-CM | POA: Insufficient documentation

## 2017-06-27 DIAGNOSIS — Z9981 Dependence on supplemental oxygen: Secondary | ICD-10-CM | POA: Diagnosis not present

## 2017-06-27 DIAGNOSIS — I10 Essential (primary) hypertension: Secondary | ICD-10-CM | POA: Diagnosis not present

## 2017-06-27 DIAGNOSIS — J449 Chronic obstructive pulmonary disease, unspecified: Secondary | ICD-10-CM | POA: Diagnosis not present

## 2017-06-27 NOTE — Progress Notes (Signed)
Trigger point injection procedure note: Trigger Point Injection: Written consent was obtained for the patient. Trigger points were identified of right lumbosacral PSPs and gluteal muscles. The areas were cleaned with alcohol, and each of  these trigger points were sprayed with vapocoolant spray and then injected with 1 cc of 0.5% Marcaine (x3). Needle draw back was performed. There were no complications from the procedure, and it was well tolerated.

## 2017-06-29 ENCOUNTER — Other Ambulatory Visit: Payer: Self-pay | Admitting: Physical Medicine & Rehabilitation

## 2017-07-01 ENCOUNTER — Other Ambulatory Visit: Payer: Self-pay | Admitting: Physical Medicine & Rehabilitation

## 2017-07-12 IMAGING — MG 2D DIGITAL SCREENING BILATERAL MAMMOGRAM WITH CAD AND ADJUNCT TO
6 of 10 series · 6 of 26 positions shown · non-contrast
Comparison: Previous exam(s).

CLINICAL DATA: Screening.

EXAM:
2D DIGITAL SCREENING BILATERAL MAMMOGRAM WITH CAD AND ADJUNCT TOMO

[L MLO (1 of 3)]
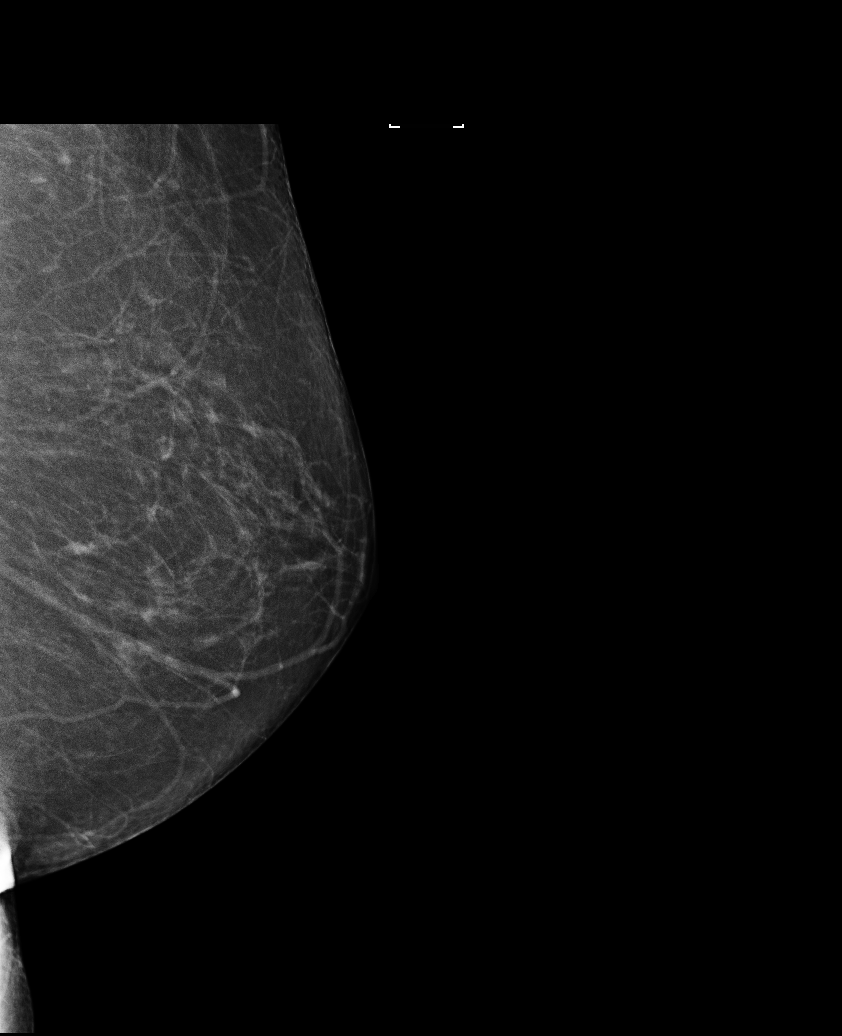

[L MLO (2 of 3)]
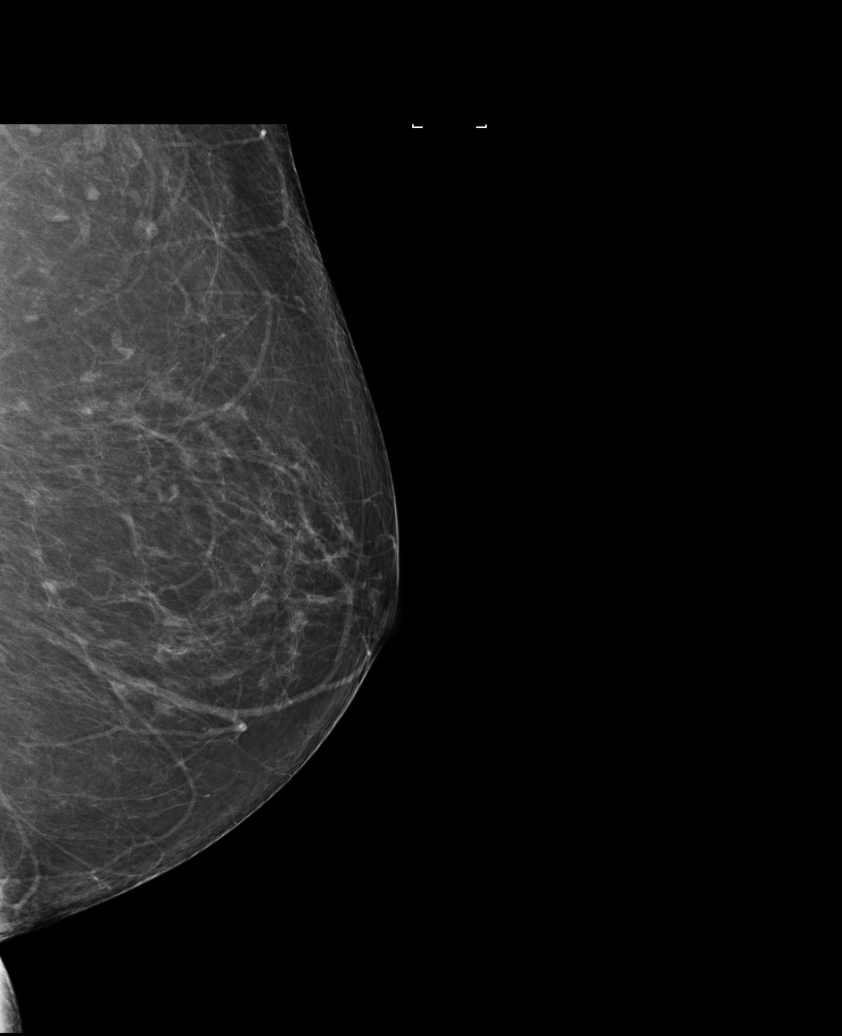

[R MLO]
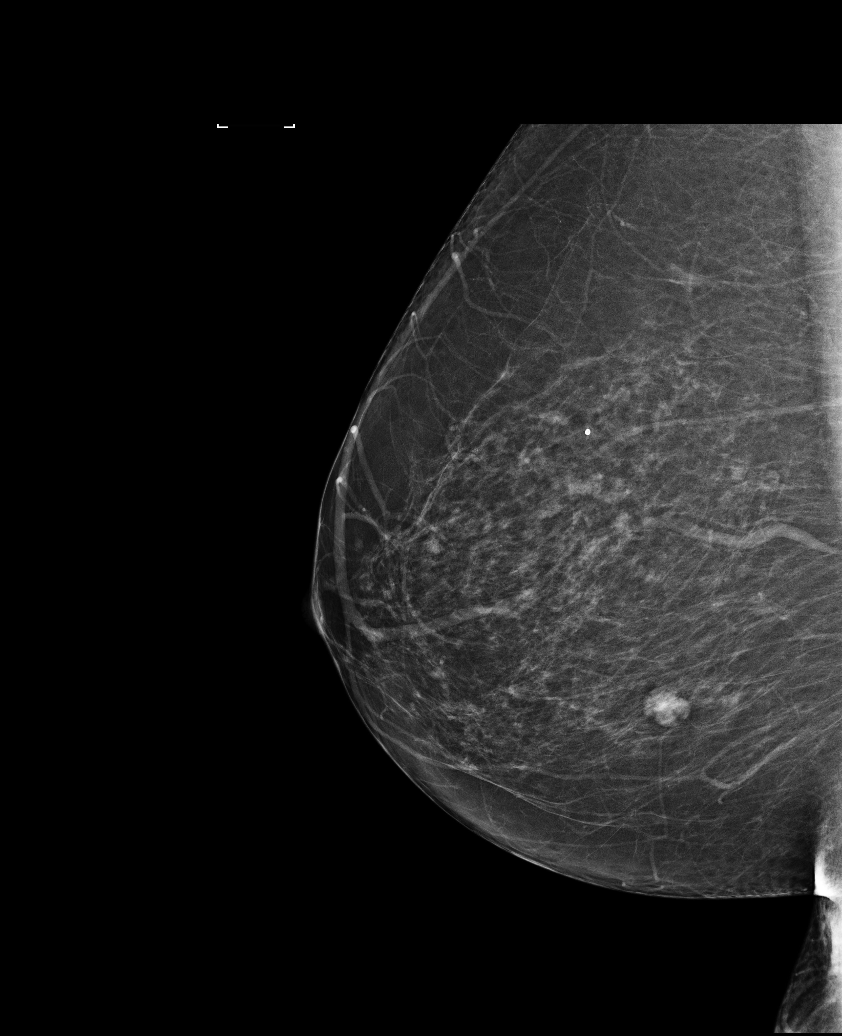

[L CC]
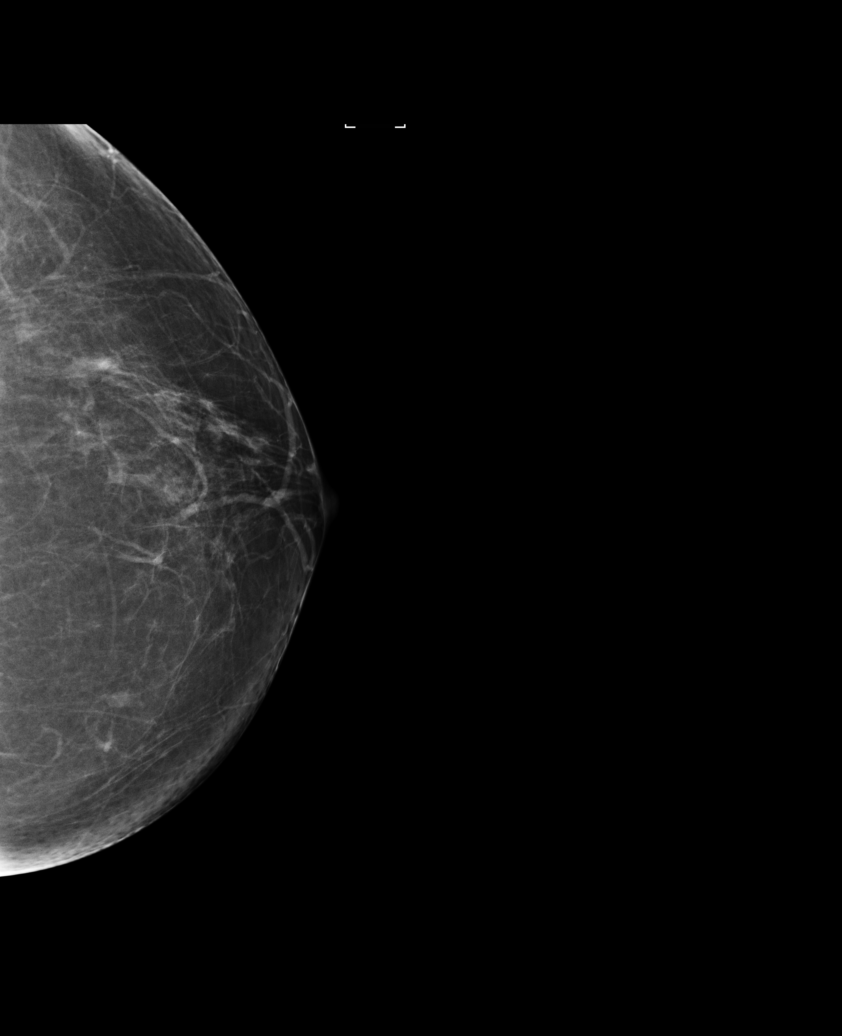

[L MLO (3 of 3)]
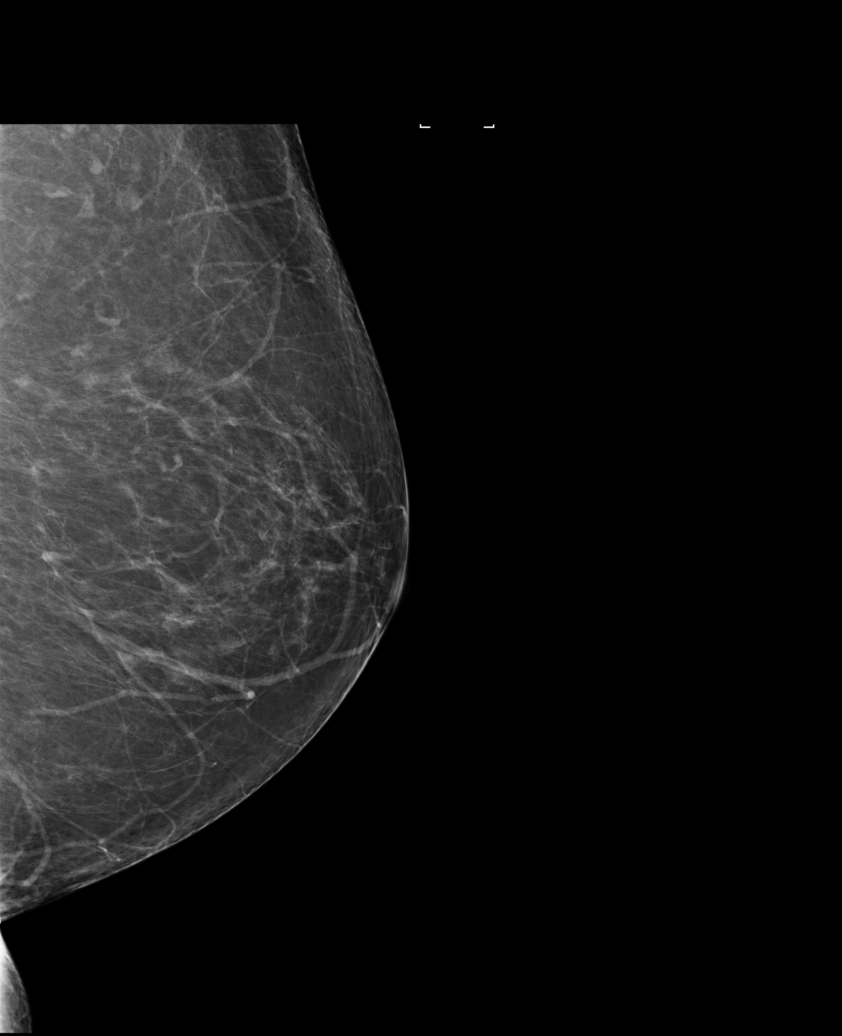

[R CC]
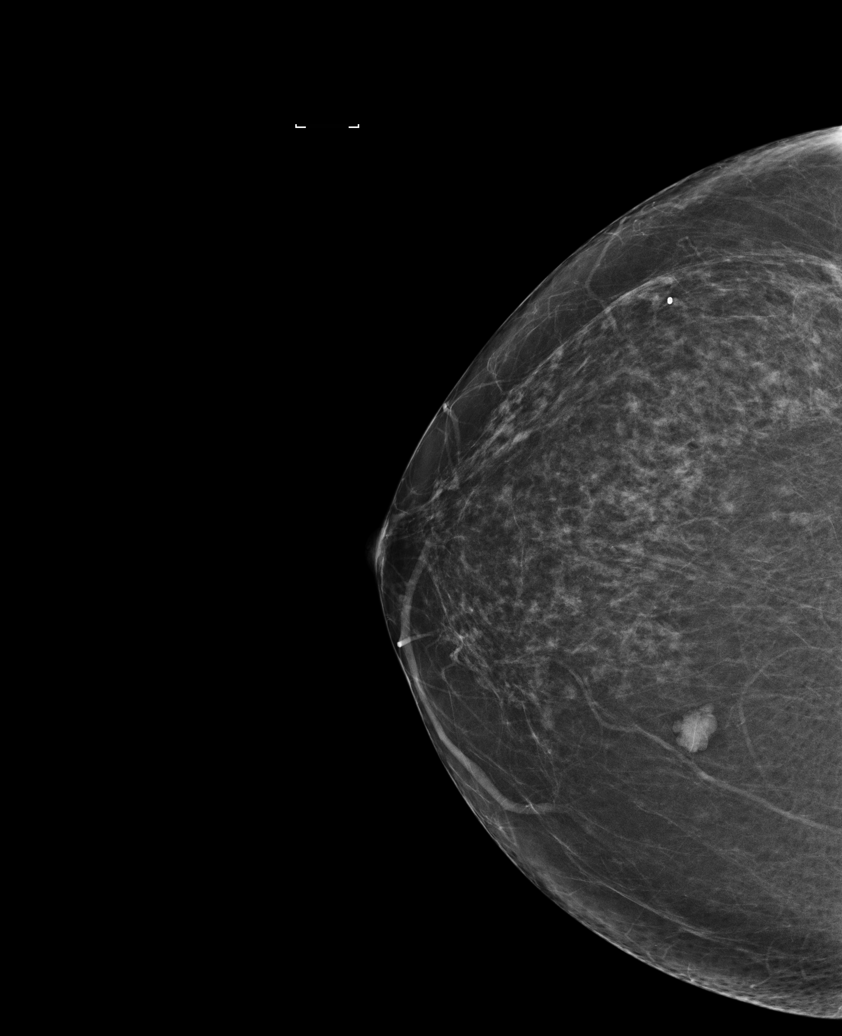

[6 of 26 positions shown; findings below may reference images not displayed]

ACR Breast Density Category b: There are scattered areas of
fibroglandular density.
FINDINGS: There are no findings suspicious for malignancy. Images were
processed with CAD.
IMPRESSION: No mammographic evidence of malignancy. A result letter of this
screening mammogram will be mailed directly to the patient.

RECOMMENDATION:
Screening mammogram in one year. (Code:97-6-RS4)

BI-RADS CATEGORY  1: Negative.

## 2017-07-25 ENCOUNTER — Encounter: Payer: Medicare Other | Admitting: Physical Medicine & Rehabilitation

## 2017-07-31 ENCOUNTER — Observation Stay (HOSPITAL_COMMUNITY)
Admission: AD | Admit: 2017-07-31 | Discharge: 2017-08-02 | Disposition: A | Discharge status: Home / Self Care | Payer: Medicare Other | Source: Ambulatory Visit | Attending: Pulmonary Disease | Admitting: Pulmonary Disease

## 2017-07-31 ENCOUNTER — Observation Stay (HOSPITAL_COMMUNITY): Payer: Medicare Other

## 2017-07-31 ENCOUNTER — Encounter (HOSPITAL_COMMUNITY): Payer: Self-pay | Admitting: *Deleted

## 2017-07-31 DIAGNOSIS — R0981 Nasal congestion: Secondary | ICD-10-CM | POA: Diagnosis not present

## 2017-07-31 DIAGNOSIS — I10 Essential (primary) hypertension: Secondary | ICD-10-CM | POA: Insufficient documentation

## 2017-07-31 DIAGNOSIS — R6883 Chills (without fever): Secondary | ICD-10-CM | POA: Insufficient documentation

## 2017-07-31 DIAGNOSIS — Z79899 Other long term (current) drug therapy: Secondary | ICD-10-CM | POA: Insufficient documentation

## 2017-07-31 DIAGNOSIS — J9621 Acute and chronic respiratory failure with hypoxia: Secondary | ICD-10-CM | POA: Insufficient documentation

## 2017-07-31 DIAGNOSIS — F3162 Bipolar disorder, current episode mixed, moderate: Secondary | ICD-10-CM | POA: Diagnosis present

## 2017-07-31 DIAGNOSIS — R05 Cough: Secondary | ICD-10-CM | POA: Diagnosis not present

## 2017-07-31 DIAGNOSIS — J449 Chronic obstructive pulmonary disease, unspecified: Principal | ICD-10-CM | POA: Insufficient documentation

## 2017-07-31 DIAGNOSIS — R51 Headache: Secondary | ICD-10-CM | POA: Insufficient documentation

## 2017-07-31 DIAGNOSIS — J45909 Unspecified asthma, uncomplicated: Secondary | ICD-10-CM | POA: Diagnosis not present

## 2017-07-31 DIAGNOSIS — J3489 Other specified disorders of nose and nasal sinuses: Secondary | ICD-10-CM | POA: Insufficient documentation

## 2017-07-31 DIAGNOSIS — F17210 Nicotine dependence, cigarettes, uncomplicated: Secondary | ICD-10-CM | POA: Insufficient documentation

## 2017-07-31 DIAGNOSIS — Z8673 Personal history of transient ischemic attack (TIA), and cerebral infarction without residual deficits: Secondary | ICD-10-CM | POA: Diagnosis not present

## 2017-07-31 DIAGNOSIS — J441 Chronic obstructive pulmonary disease with (acute) exacerbation: Secondary | ICD-10-CM | POA: Diagnosis present

## 2017-07-31 DIAGNOSIS — Z7982 Long term (current) use of aspirin: Secondary | ICD-10-CM | POA: Insufficient documentation

## 2017-07-31 DIAGNOSIS — Z23 Encounter for immunization: Secondary | ICD-10-CM | POA: Insufficient documentation

## 2017-07-31 DIAGNOSIS — R0602 Shortness of breath: Secondary | ICD-10-CM | POA: Diagnosis present

## 2017-07-31 DIAGNOSIS — K219 Gastro-esophageal reflux disease without esophagitis: Secondary | ICD-10-CM | POA: Diagnosis present

## 2017-07-31 DIAGNOSIS — R079 Chest pain, unspecified: Secondary | ICD-10-CM | POA: Diagnosis not present

## 2017-07-31 DIAGNOSIS — I6930 Unspecified sequelae of cerebral infarction: Secondary | ICD-10-CM

## 2017-07-31 LAB — COMPREHENSIVE METABOLIC PANEL
ALT: 35 U/L (ref 14–54)
AST: 42 U/L — AB (ref 15–41)
Albumin: 3.3 g/dL — ABNORMAL LOW (ref 3.5–5.0)
Alkaline Phosphatase: 126 U/L (ref 38–126)
Anion gap: 7 (ref 5–15)
BUN: 8 mg/dL (ref 6–20)
CHLORIDE: 103 mmol/L (ref 101–111)
CO2: 30 mmol/L (ref 22–32)
Calcium: 8.7 mg/dL — ABNORMAL LOW (ref 8.9–10.3)
Creatinine, Ser: 0.81 mg/dL (ref 0.44–1.00)
GFR calc Af Amer: >60 mL/min (ref >60)
Glucose, Bld: 122 mg/dL — ABNORMAL HIGH (ref 65–99)
Potassium: 4.4 mmol/L (ref 3.5–5.1)
Sodium: 140 mmol/L (ref 135–145)
Total Bilirubin: 0.5 mg/dL (ref 0.3–1.2)
Total Protein: 6.5 g/dL (ref 6.5–8.1)

## 2017-07-31 LAB — CBC WITH DIFFERENTIAL/PLATELET
BASOS ABS: 0 10*3/uL (ref 0.0–0.1)
Basophils Relative: 0 %
Eosinophils Absolute: 0 10*3/uL (ref 0.0–0.7)
Eosinophils Relative: 0 %
HEMATOCRIT: 47.7 % — AB (ref 36.0–46.0)
HEMOGLOBIN: 15.1 g/dL — AB (ref 12.0–15.0)
LYMPHS ABS: 2.6 10*3/uL (ref 0.7–4.0)
LYMPHS PCT: 18 %
MCH: 30.4 pg (ref 26.0–34.0)
MCHC: 31.7 g/dL (ref 30.0–36.0)
MCV: 96.2 fL (ref 78.0–100.0)
Monocytes Absolute: 0.8 10*3/uL (ref 0.1–1.0)
Monocytes Relative: 6 %
NEUTROS ABS: 10.7 10*3/uL — AB (ref 1.7–7.7)
NEUTROS PCT: 76 %
Platelets: 344 10*3/uL (ref 150–400)
RBC: 4.96 MIL/uL (ref 3.87–5.11)
RDW: 14.9 % (ref 11.5–15.5)
WBC: 14.1 10*3/uL — AB (ref 4.0–10.5)

## 2017-07-31 MED ORDER — PERPHENAZINE 8 MG PO TABS
32.0000 mg | ORAL_TABLET | Freq: Every day | ORAL | Status: DC
  Administered 2017-07-31 – 2017-08-01 (×2): 32 mg via ORAL
  Filled 2017-07-31: qty 2
  Filled 2017-07-31 (×2): qty 4
  Filled 2017-07-31: qty 2
  Filled 2017-07-31: qty 4

## 2017-07-31 MED ORDER — LORATADINE 10 MG PO TABS
10.0000 mg | ORAL_TABLET | Freq: Every day | ORAL | Status: DC
  Administered 2017-07-31 – 2017-08-02 (×3): 10 mg via ORAL
  Filled 2017-07-31 (×4): qty 1

## 2017-07-31 MED ORDER — LAMOTRIGINE 100 MG PO TABS
300.0000 mg | ORAL_TABLET | Freq: Every day | ORAL | Status: DC
  Administered 2017-07-31 – 2017-08-01 (×2): 300 mg via ORAL
  Filled 2017-07-31 (×2): qty 3

## 2017-07-31 MED ORDER — GUAIFENESIN ER 600 MG PO TB12
600.0000 mg | ORAL_TABLET | Freq: Two times a day (BID) | ORAL | Status: DC
  Administered 2017-07-31 – 2017-08-02 (×4): 600 mg via ORAL
  Filled 2017-07-31 (×4): qty 1

## 2017-07-31 MED ORDER — ACETAMINOPHEN 650 MG RE SUPP: 650.0000 mg | Freq: Four times a day (QID) | RECTAL | Status: DC | PRN

## 2017-07-31 MED ORDER — ONDANSETRON HCL 4 MG/2ML IJ SOLN: 4.0000 mg | Freq: Four times a day (QID) | INTRAMUSCULAR | Status: DC | PRN

## 2017-07-31 MED ORDER — MELOXICAM 7.5 MG PO TABS
15.0000 mg | ORAL_TABLET | Freq: Every day | ORAL | Status: DC
  Administered 2017-07-31 – 2017-08-02 (×3): 15 mg via ORAL
  Filled 2017-07-31 (×2): qty 2
  Filled 2017-07-31 (×3): qty 1
  Filled 2017-07-31 (×2): qty 2

## 2017-07-31 MED ORDER — SODIUM CHLORIDE 0.9 % IV SOLN
INTRAVENOUS | Status: DC
  Administered 2017-07-31 – 2017-08-02 (×2): via INTRAVENOUS

## 2017-07-31 MED ORDER — DOXEPIN HCL 25 MG PO CAPS
75.0000 mg | ORAL_CAPSULE | Freq: Every day | ORAL | Status: DC
  Administered 2017-07-31 – 2017-08-01 (×2): 75 mg via ORAL
  Filled 2017-07-31 (×2): qty 3

## 2017-07-31 MED ORDER — MELOXICAM 7.5 MG PO TABS
ORAL_TABLET | ORAL | Status: AC
  Filled 2017-07-31: qty 2

## 2017-07-31 MED ORDER — ONDANSETRON HCL 4 MG PO TABS
4.0000 mg | ORAL_TABLET | Freq: Four times a day (QID) | ORAL | Status: DC | PRN
Start: 2017-07-31 — End: 2017-08-02

## 2017-07-31 MED ORDER — IPRATROPIUM-ALBUTEROL 0.5-2.5 (3) MG/3ML IN SOLN
3.0000 mL | Freq: Four times a day (QID) | RESPIRATORY_TRACT | Status: DC
  Administered 2017-07-31 – 2017-08-02 (×6): 3 mL via RESPIRATORY_TRACT
  Filled 2017-07-31 (×6): qty 3

## 2017-07-31 MED ORDER — MONTELUKAST SODIUM 10 MG PO TABS
10.0000 mg | ORAL_TABLET | Freq: Every evening | ORAL | Status: DC
  Administered 2017-07-31 – 2017-08-01 (×2): 10 mg via ORAL
  Filled 2017-07-31 (×2): qty 1

## 2017-07-31 MED ORDER — PANTOPRAZOLE SODIUM 40 MG PO TBEC
40.0000 mg | DELAYED_RELEASE_TABLET | Freq: Every day | ORAL | Status: DC
  Administered 2017-07-31 – 2017-08-01 (×2): 40 mg via ORAL
  Filled 2017-07-31 (×2): qty 1

## 2017-07-31 MED ORDER — ALBUTEROL SULFATE (2.5 MG/3ML) 0.083% IN NEBU
2.5000 mg | INHALATION_SOLUTION | RESPIRATORY_TRACT | Status: DC | PRN
  Administered 2017-08-01: 2.5 mg via RESPIRATORY_TRACT
  Filled 2017-07-31: qty 3

## 2017-07-31 MED ORDER — MOMETASONE FURO-FORMOTEROL FUM 200-5 MCG/ACT IN AERO
INHALATION_SPRAY | RESPIRATORY_TRACT | Status: AC
  Filled 2017-07-31: qty 8.8

## 2017-07-31 MED ORDER — TIZANIDINE HCL 4 MG PO TABS: 8.0000 mg | ORAL_TABLET | Freq: Three times a day (TID) | ORAL | Status: DC | PRN

## 2017-07-31 MED ORDER — ALPRAZOLAM 1 MG PO TABS
1.0000 mg | ORAL_TABLET | Freq: Four times a day (QID) | ORAL | Status: DC
  Administered 2017-07-31: 1 mg via ORAL
  Filled 2017-07-31 (×7): qty 1

## 2017-07-31 MED ORDER — ASENAPINE MALEATE 5 MG SL SUBL
20.0000 mg | SUBLINGUAL_TABLET | Freq: Every evening | SUBLINGUAL | Status: DC
  Administered 2017-08-01: 20 mg via SUBLINGUAL
  Filled 2017-07-31: qty 4
  Filled 2017-07-31: qty 2
  Filled 2017-07-31 (×2): qty 4

## 2017-07-31 MED ORDER — METHYLPREDNISOLONE SODIUM SUCC 40 MG IJ SOLR
40.0000 mg | Freq: Four times a day (QID) | INTRAMUSCULAR | Status: DC
  Administered 2017-07-31 – 2017-08-02 (×7): 40 mg via INTRAVENOUS
  Filled 2017-07-31 (×7): qty 1

## 2017-07-31 MED ORDER — ORAL CARE MOUTH RINSE
15.0000 mL | Freq: Two times a day (BID) | OROMUCOSAL | Status: DC
  Administered 2017-07-31 – 2017-08-01 (×2): 15 mL via OROMUCOSAL

## 2017-07-31 MED ORDER — ACETAMINOPHEN 325 MG PO TABS: 650.0000 mg | ORAL_TABLET | Freq: Four times a day (QID) | ORAL | Status: DC | PRN

## 2017-07-31 MED ORDER — DEXTROSE 5 % IV SOLN
1.0000 g | INTRAVENOUS | Status: DC
  Administered 2017-07-31 – 2017-08-01 (×2): 1 g via INTRAVENOUS
  Filled 2017-07-31 (×6): qty 10

## 2017-07-31 MED ORDER — PROPRANOLOL HCL 20 MG PO TABS
10.0000 mg | ORAL_TABLET | Freq: Two times a day (BID) | ORAL | Status: DC
  Administered 2017-07-31 – 2017-08-02 (×3): 10 mg via ORAL
  Filled 2017-07-31 (×4): qty 1

## 2017-07-31 MED ORDER — MOMETASONE FURO-FORMOTEROL FUM 200-5 MCG/ACT IN AERO
2.0000 | INHALATION_SPRAY | Freq: Two times a day (BID) | RESPIRATORY_TRACT | Status: DC
  Administered 2017-07-31 – 2017-08-02 (×4): 2 via RESPIRATORY_TRACT
  Filled 2017-07-31: qty 8.8

## 2017-07-31 MED ORDER — ENOXAPARIN SODIUM 40 MG/0.4ML ~~LOC~~ SOLN
40.0000 mg | SUBCUTANEOUS | Status: DC
  Administered 2017-07-31 – 2017-08-01 (×2): 40 mg via SUBCUTANEOUS
  Filled 2017-07-31 (×2): qty 0.4

## 2017-07-31 MED ORDER — GABAPENTIN 300 MG PO CAPS
900.0000 mg | ORAL_CAPSULE | Freq: Every day | ORAL | Status: DC
  Administered 2017-07-31 – 2017-08-01 (×2): 900 mg via ORAL
  Filled 2017-07-31 (×2): qty 3

## 2017-07-31 MED ORDER — ASPIRIN EC 81 MG PO TBEC
81.0000 mg | DELAYED_RELEASE_TABLET | Freq: Every day | ORAL | Status: DC
  Administered 2017-08-01 – 2017-08-02 (×2): 81 mg via ORAL
  Filled 2017-07-31 (×4): qty 1

## 2017-07-31 MED ORDER — INFLUENZA VAC SPLIT QUAD 0.5 ML IM SUSY
0.5000 mL | PREFILLED_SYRINGE | INTRAMUSCULAR | Status: AC
  Administered 2017-08-01: 0.5 mL via INTRAMUSCULAR
  Filled 2017-07-31: qty 0.5

## 2017-07-31 MED ORDER — MIRTAZAPINE 30 MG PO TABS
45.0000 mg | ORAL_TABLET | Freq: Every day | ORAL | Status: DC
  Administered 2017-07-31 – 2017-08-01 (×2): 45 mg via ORAL
  Filled 2017-07-31 (×2): qty 1

## 2017-07-31 MED ORDER — ATORVASTATIN CALCIUM 20 MG PO TABS
20.0000 mg | ORAL_TABLET | Freq: Every day | ORAL | Status: DC
  Administered 2017-07-31 – 2017-08-01 (×2): 20 mg via ORAL
  Filled 2017-07-31 (×3): qty 1

## 2017-07-31 NOTE — H&P (Signed)
She was admitted with COPD not responsive to outpatient treatment. Full note to follow.

## 2017-08-01 ENCOUNTER — Encounter: Payer: Medicare Other | Admitting: Physical Medicine & Rehabilitation

## 2017-08-01 DIAGNOSIS — J449 Chronic obstructive pulmonary disease, unspecified: Secondary | ICD-10-CM | POA: Diagnosis not present

## 2017-08-01 NOTE — H&P (Signed)
Alicia Dixon MRN: [id number] DOB/AGE: 1966-06-08 51 y.o. Primary Care Physician:Baucom, Morton StallJenny B, PA-C Admit date: 07/31/2017 Chief Complaint: Shortness of breath HPI: This is documentation of history and physical performed in my office yesterday 07/31/2017. She came in as a work in visit because she was more short of breath having cough and congestion. She is taking prednisone and Levaquin and it has not helped. She has no other new complaints. Her cough is productive of green sputum. No definite fever but she has felt chilly. She's had some chest pain. No nausea or vomiting. She has had a headache. She's had some rhinorrhea. No urinary symptoms.  Past Medical History:  Diagnosis Date  . Asthma   . Bipolar disorder (HCC)   . COPD (chronic obstructive pulmonary disease) (HCC)   . Depression   . Stroke [facility](HCC)    Past Surgical History:  Procedure Laterality Date  . ABDOMINAL HYSTERECTOMY    . APPENDECTOMY    . CESAREAN SECTION    . CHOLECYSTECTOMY    . LUNG LOBECTOMY     right lung         Family History  Problem Relation Age of Onset  . Colon cancer Maternal Aunt    She is not aware of a family history of lung disease. Social History:  reports that she has been smoking Cigarettes.  She has a 8.75 pack-year smoking history. She has never used smokeless tobacco. She reports that she does not drink alcohol or use drugs. Her smoking history is longer than 8.75 pack years and is about 35 years.  Allergies:  Allergies  Allergen Reactions  . Other Anaphylaxis    Allergic to melons  . Baclofen     hallucinations  . Flonase  [Fluticasone Propionate] Other (See Comments)    Nose swelling and nose bleeds  . Gadopentetate Dimeglumine Other (See Comments)    "purple" discoloration, slight numbness to left fifth finger, "purple dots" on the top of left hand following MRI exam 07/22/13 with Magnevist contrast.  Radiologist did not feel this was related to contrast.  . Lortab  [Hydrocodone-Acetaminophen]     N/v hives  . Seroquel [Quetiapine Fumarate]     n/v  . Sudafed [Pseudoephedrine Hcl]     blisters  . Trazodone And Nefazodone Nausea And Vomiting    Medications Prior to Admission  Medication Sig Dispense Refill  . ALPRAZolam (XANAX) 1 MG tablet Take 1 mg by mouth 4 (four) times daily as needed for anxiety.   3  . aspirin EC 81 MG tablet Take 81 mg by mouth daily.    Marland Kitchen. atorvastatin (LIPITOR) 20 MG tablet Take 20 mg by mouth at bedtime.  1  . cetirizine (ZYRTEC) 10 MG tablet Take 10 mg by mouth daily.    Marland Kitchen. doxepin (SINEQUAN) 75 MG capsule take 1 capsule at bedtime  1  . doxycycline (VIBRAMYCIN) 100 MG capsule TAKE ONE CAPSULE BY MOUTH TWICE DAILY FOR 10 DAYS STARTING ON 10*17/2018  0  . DULERA 200-5 MCG/ACT AERO INHALE TWO PUFFS BY MOUTH TWICE DAILY  12  . gabapentin (NEURONTIN) 300 MG capsule Take 900 mg by mouth at bedtime.  2  . guaiFENesin (MUCINEX) 600 MG 12 hr tablet Take 1 tablet (600 mg total) by mouth 2 (two) times daily. 20 tablet 2  . ipratropium-albuterol (DUONEB) 0.5-2.5 (3) MG/3ML SOLN Take 3 mLs by nebulization 4 (four) times daily. 360 mL 12  . lamoTRIgine (LAMICTAL) 150 MG tablet Take 300 mg by mouth at bedtime.  3  . meloxicam (MOBIC) 15 MG tablet TAKE 1 TABLET(15 MG) BY MOUTH DAILY 30 tablet 1  . mirtazapine (REMERON) 45 MG tablet Take 45 mg by mouth at bedtime.  3  . montelukast (SINGULAIR) 10 MG tablet Take 10 mg by mouth every evening.  2  . pantoprazole (PROTONIX) 40 MG tablet Take 1 tablet by mouth every morning.     Marland Kitchen perphenazine (TRILAFON) 8 MG tablet Take 32 mg by mouth at bedtime.   3  . predniSONE (DELTASONE) 10 MG tablet Take 10 mg by mouth daily.   5  . PROAIR HFA 108 (90 Base) MCG/ACT inhaler Inhale 2 puffs into the lungs every 4 (four) hours as needed. Shortness of breath  2  . propranolol (INDERAL) 10 MG tablet Take 1 tablet by mouth 2 (two) times daily.  1  . SAPHRIS 10 MG SUBL Place 20 mg under the tongue every evening.    3  . tiZANidine (ZANAFLEX) 2 MG tablet TAKE 1 TABLET(2 MG) BY MOUTH EVERY 8 HOURS AS NEEDED FOR MUSCLE SPASMS (Patient taking differently: TAKE 1 TABLET(2 MG) BY MOUTH TWICE DAILY) 90 tablet 2       ZOX:WRUEA from the symptoms mentioned above,there are no other symptoms referable to all systems reviewed. 10 point review of systems otherwise negative  Physical Exam: Blood pressure 134/86, pulse 93, temperature 98.4 F (36.9 C), temperature source Oral, resp. rate 20, height 5\' 8"  (1.727 m), weight 103.2 kg (227 lb 8 oz), SpO2 92 %. Constitutional: She is awake and alert and in no acute distress. Eyes: Pupils reactive. EOMI. Ears nose mouth and throat: Her mucous membranes are moist. She is edentulous. She has some drainage down the back of her throat. Cardiovascular: Her heart is regular with normal heart sounds. No edema. Respiratory: She has bilateral wheezes and mildly increased respiratory effort. Gastrointestinal: Her abdomen is soft with no masses. Skin: Warm and dry. Musculoskeletal: Normal muscle strength. Neurological: No focal abnormalities. Psychiatric: At baseline she has bipolar disease and she still has significant anxiety.    Recent Labs  07/31/17 1826  WBC 14.1*  NEUTROABS 10.7*  HGB 15.1*  HCT 47.7*  MCV 96.2  PLT 344    Recent Labs  07/31/17 1826  NA 140  K 4.4  CL 103  CO2 30  GLUCOSE 122*  BUN 8  CREATININE 0.81  CALCIUM 8.7*  lablast2(ast:2,ALT:2,alkphos:2,bilitot:2,prot:2,albumin:2)@    No results found for this or any previous visit (from the past 240 hour(s)).   X-ray Chest Pa And Lateral  Result Date: 07/31/2017 CLINICAL DATA:  Cough and shortness of Breath for several days EXAM: CHEST  2 VIEW COMPARISON:  05/22/2017 FINDINGS: Cardiac shadow is stable. The lungs are well aerated bilaterally. Stable bibasilar changes are noted representing atelectasis/scarring. No focal confluent infiltrate is seen. No sizable effusion is noted. IMPRESSION:  Stable bibasilar changes when compared with the prior exam. No new focal abnormality is seen. Electronically Signed   By: Alcide Clever M.D.   On: 07/31/2017 21:42   Impression: She is admitted with COPD exacerbation. She does not have pneumonia based on chest x-ray which I have personally reviewed.  She has bipolar disease which is stable but pretty severe.  She has acute on chronic hypoxic respiratory failure. Active Problems:   COPD with acute exacerbation (HCC)     Plan: She will be admitted for IV antibiotics and steroids.      Hamid Brookens L   08/01/2017, 7:55 AM

## 2017-08-01 NOTE — Care Management Obs Status (Signed)
MEDICARE OBSERVATION STATUS NOTIFICATION   Patient Details  Name: Alicia Dixon MRN: [id number] Date of Birth: 1966-09-30   Medicare Observation Status Notification Given:  Yes    Malcolm MetroChildress, Simran Mannis Demske, RN 08/01/2017, 3:12 PM

## 2017-08-01 NOTE — Care Management Note (Signed)
Case Management Note  Patient Details  Name: Alicia Dixon MRN: [id number] Date of Birth: Aug 30, 1966  Subjective/Objective:               Admitted with COPD. Pt is from home, lives with spouse. She is ind with ADL's. Uses cane/walker as needed with ambulation. She has home oxygen and neb machine pta. She manages her own medications, reports compliance. She sometimes drives herself to appointments and sometimes uses [facility]. She insurance with drug coverage and does have problem affording medications. She communicates no needs or concerns.       Action/Plan: Anticipate DC home with self care. CM will follow peripherally to DC.  Expected Discharge Date:  08/02/17               Expected Discharge Plan:  Home/Self Care  In-House Referral:  NA  Discharge planning Services  CM Consult  Post Acute Care Choice:  NA Choice offered to:  NA  Status of Service:  Completed, signed off  Malcolm MetroChildress, Sylwia Cuervo Demske, RN 08/01/2017, 3:13 PM

## 2017-08-01 NOTE — Progress Notes (Signed)
Subjective: She was admitted yesterday with COPD exacerbation. She's better but not clear. She is still wheezing. She did not have pneumonia by chest x-ray that I personally reviewed.  Objective: Vital signs in last 24 hours: Temp:  [97 F (36.1 C)-98.4 F (36.9 C)] 98.4 F (36.9 C) (10/25 0511) Pulse Rate:  [93-109] 93 (10/25 0511) Resp:  [18-22] 20 (10/25 0511) BP: (119-134)/(80-87) 134/86 (10/25 0511) SpO2:  [89 %-94 %] 94 % (10/25 0810) Weight:  [103.2 kg (227 lb 8 oz)-104.9 kg (231 lb 3.2 oz)] 103.2 kg (227 lb 8 oz) (10/25 0511) Weight change:  Last BM Date: 07/30/17  Intake/Output from previous day: 10/24 0701 - 10/25 0700 In: 878.8 [I.V.:828.8; IV Piggyback:50] Out: -   PHYSICAL EXAM General appearance: alert, cooperative and mild distress Resp: rhonchi bilaterally and wheezes bilaterally Cardio: regular rate and rhythm, S1, S2 normal, no murmur, click, rub or gallop GI: soft, non-tender; bowel sounds normal; no masses,  no organomegaly Extremities: extremities normal, atraumatic, no cyanosis or edema Skin warm and dry she is mildly cushingoid  Lab Results:  Results for orders placed or performed during the hospital encounter of 07/31/17 (from the past 48 hour(s))  Comprehensive metabolic panel     Status: Abnormal   Collection Time: 07/31/17  6:26 PM  Result Value Ref Range   Sodium 140 135 - 145 mmol/L   Potassium 4.4 3.5 - 5.1 mmol/L   Chloride 103 101 - 111 mmol/L   CO2 30 22 - 32 mmol/L   Glucose, Bld 122 (H) 65 - 99 mg/dL   BUN 8 6 - 20 mg/dL   Creatinine, Ser 0.81 0.44 - 1.00 mg/dL   Calcium 8.7 (L) 8.9 - 10.3 mg/dL   Total Protein 6.5 6.5 - 8.1 g/dL   Albumin 3.3 (L) 3.5 - 5.0 g/dL   AST 42 (H) 15 - 41 U/L   ALT 35 14 - 54 U/L   Alkaline Phosphatase 126 38 - 126 U/L   Total Bilirubin 0.5 0.3 - 1.2 mg/dL   GFR calc non Af Amer >60 >60 mL/min   GFR calc Af Amer >60 >60 mL/min    Comment: (NOTE) The eGFR has been calculated using the CKD EPI  equation. This calculation has not been validated in all clinical situations. eGFR's persistently <60 mL/min signify possible Chronic Kidney Disease.    Anion gap 7 5 - 15  CBC WITH DIFFERENTIAL     Status: Abnormal   Collection Time: 07/31/17  6:26 PM  Result Value Ref Range   WBC 14.1 (H) 4.0 - 10.5 K/uL   RBC 4.96 3.87 - 5.11 MIL/uL   Hemoglobin 15.1 (H) 12.0 - 15.0 g/dL   HCT 47.7 (H) 36.0 - 46.0 %   MCV 96.2 78.0 - 100.0 fL   MCH 30.4 26.0 - 34.0 pg   MCHC 31.7 30.0 - 36.0 g/dL   RDW 14.9 11.5 - 15.5 %   Platelets 344 150 - 400 K/uL   Neutrophils Relative % 76 %   Neutro Abs 10.7 (H) 1.7 - 7.7 K/uL   Lymphocytes Relative 18 %   Lymphs Abs 2.6 0.7 - 4.0 K/uL   Monocytes Relative 6 %   Monocytes Absolute 0.8 0.1 - 1.0 K/uL   Eosinophils Relative 0 %   Eosinophils Absolute 0.0 0.0 - 0.7 K/uL   Basophils Relative 0 %   Basophils Absolute 0.0 0.0 - 0.1 K/uL    ABGS No results for input(s): PHART, PO2ART, TCO2, HCO3 in the last  72 hours.  Invalid input(s): PCO2 CULTURES No results found for this or any previous visit (from the past 240 hour(s)). Studies/Results: X-ray Chest Pa And Lateral  Result Date: 07/31/2017 CLINICAL DATA:  Cough and shortness of Breath for several days EXAM: CHEST  2 VIEW COMPARISON:  05/22/2017 FINDINGS: Cardiac shadow is stable. The lungs are well aerated bilaterally. Stable bibasilar changes are noted representing atelectasis/scarring. No focal confluent infiltrate is seen. No sizable effusion is noted. IMPRESSION: Stable bibasilar changes when compared with the prior exam. No new focal abnormality is seen. Electronically Signed   By: Inez Catalina M.D.   On: 07/31/2017 21:42    Medications:  Prior to Admission:  Prescriptions Prior to Admission  Medication Sig Dispense Refill Last Dose  . ALPRAZolam (XANAX) 1 MG tablet Take 1 mg by mouth 4 (four) times daily as needed for anxiety.   3 07/31/2017 at Unknown time  . aspirin EC 81 MG tablet Take  81 mg by mouth daily.   07/31/2017 at Unknown time  . atorvastatin (LIPITOR) 20 MG tablet Take 20 mg by mouth at bedtime.  1 07/30/2017 at Unknown time  . cetirizine (ZYRTEC) 10 MG tablet Take 10 mg by mouth daily.   07/31/2017 at Unknown time  . doxepin (SINEQUAN) 75 MG capsule take 1 capsule at bedtime  1 07/30/2017 at Unknown time  . doxycycline (VIBRAMYCIN) 100 MG capsule TAKE ONE CAPSULE BY MOUTH TWICE DAILY FOR 10 DAYS STARTING ON 10*17/2018  0 07/31/2017 at Unknown time  . DULERA 200-5 MCG/ACT AERO INHALE TWO PUFFS BY MOUTH TWICE DAILY  12 07/31/2017 at Unknown time  . gabapentin (NEURONTIN) 300 MG capsule Take 900 mg by mouth at bedtime.  2 07/30/2017 at Unknown time  . guaiFENesin (MUCINEX) 600 MG 12 hr tablet Take 1 tablet (600 mg total) by mouth 2 (two) times daily. 20 tablet 2 07/31/2017 at Unknown time  . ipratropium-albuterol (DUONEB) 0.5-2.5 (3) MG/3ML SOLN Take 3 mLs by nebulization 4 (four) times daily. 360 mL 12 07/31/2017 at Unknown time  . lamoTRIgine (LAMICTAL) 150 MG tablet Take 300 mg by mouth at bedtime.  3 07/30/2017 at Unknown time  . meloxicam (MOBIC) 15 MG tablet TAKE 1 TABLET(15 MG) BY MOUTH DAILY 30 tablet 1 07/31/2017 at Unknown time  . mirtazapine (REMERON) 45 MG tablet Take 45 mg by mouth at bedtime.  3 07/30/2017 at Unknown time  . montelukast (SINGULAIR) 10 MG tablet Take 10 mg by mouth every evening.  2 07/30/2017 at Unknown time  . pantoprazole (PROTONIX) 40 MG tablet Take 1 tablet by mouth every morning.    07/31/2017 at Unknown time  . perphenazine (TRILAFON) 8 MG tablet Take 32 mg by mouth at bedtime.   3 07/30/2017 at Unknown time  . predniSONE (DELTASONE) 10 MG tablet Take 10 mg by mouth daily.   5 07/31/2017 at Unknown time  . PROAIR HFA 108 (90 Base) MCG/ACT inhaler Inhale 2 puffs into the lungs every 4 (four) hours as needed. Shortness of breath  2 07/31/2017 at Unknown time  . propranolol (INDERAL) 10 MG tablet Take 1 tablet by mouth 2 (two) times daily.   1 07/31/2017 at 900A  . SAPHRIS 10 MG SUBL Place 20 mg under the tongue every evening.   3 07/30/2017 at Unknown time  . tiZANidine (ZANAFLEX) 2 MG tablet TAKE 1 TABLET(2 MG) BY MOUTH EVERY 8 HOURS AS NEEDED FOR MUSCLE SPASMS (Patient taking differently: TAKE 1 TABLET(2 MG) BY MOUTH TWICE DAILY) 90 tablet  2 07/31/2017 at Unknown time   Scheduled: . ALPRAZolam  1 mg Oral QID  . asenapine  20 mg Sublingual QPM  . aspirin EC  81 mg Oral Daily  . atorvastatin  20 mg Oral QHS  . doxepin  75 mg Oral QHS  . enoxaparin (LOVENOX) injection  40 mg Subcutaneous Q24H  . gabapentin  900 mg Oral QHS  . guaiFENesin  600 mg Oral BID  . Influenza vac split quadrivalent PF  0.5 mL Intramuscular Tomorrow-1000  . ipratropium-albuterol  3 mL Nebulization QID  . lamoTRIgine  300 mg Oral QHS  . loratadine  10 mg Oral Daily  . mouth rinse  15 mL Mouth Rinse BID  . meloxicam  15 mg Oral Daily  . methylPREDNISolone (SOLU-MEDROL) injection  40 mg Intravenous Q6H  . mirtazapine  45 mg Oral QHS  . mometasone-formoterol  2 puff Inhalation BID  . montelukast  10 mg Oral QPM  . pantoprazole  40 mg Oral QHS  . perphenazine  32 mg Oral QHS  . propranolol  10 mg Oral BID   Continuous: . sodium chloride 75 mL/hr at 07/31/17 1757  . cefTRIAXone (ROCEPHIN)  IV Stopped (07/31/17 2047)   QBH:[id number] **OR** acetaminophen, albuterol, ondansetron **OR** ondansetron (ZOFRAN) IV, tiZANidine  Assesment: She was admitted with COPD exacerbation. She is on IV steroids and IV antibiotics and she's better. At baseline she has bipolar disease which complicates her treatment. Active Problems:   COPD with acute exacerbation ([place])    Plan: Continue current medications and treatments    LOS: 0 days   Natara Monfort L 08/01/2017, 9:03 AM

## 2017-08-02 DIAGNOSIS — J449 Chronic obstructive pulmonary disease, unspecified: Secondary | ICD-10-CM | POA: Diagnosis not present

## 2017-08-02 MED ORDER — CEPHALEXIN 500 MG PO CAPS: 500.0000 mg | ORAL_CAPSULE | Freq: Three times a day (TID) | ORAL | 0 refills | Status: AC

## 2017-08-02 NOTE — Progress Notes (Signed)
Subjective: She says she feels better and wants to go home. Her breathing is better. She is coughing less.  Objective: Vital signs in last 24 hours: Temp:  [98.1 F (36.7 C)-98.5 F (36.9 C)] 98.4 F (36.9 C) (10/26 0530) Pulse Rate:  [64-102] 93 (10/26 0530) Resp:  [16] 16 (10/26 0530) BP: (118-166)/(60-90) 133/90 (10/26 0530) SpO2:  [92 %-95 %] 93 % (10/26 0803) Weight:  [103.9 kg (229 lb 0.9 oz)] 103.9 kg (229 lb 0.9 oz) (10/26 0530) Weight change: -0.972 kg (-2 lb 2.3 oz) Last BM Date: 08/01/17  Intake/Output from previous day: 10/25 0701 - 10/26 0700 In: 2420 [P.O.:720; I.V.:1650; IV Piggyback:50] Out: -   PHYSICAL EXAM General appearance: alert, cooperative and no distress Resp: clear to auscultation bilaterally Cardio: regular rate and rhythm, S1, S2 normal, no murmur, click, rub or gallop GI: soft, non-tender; bowel sounds normal; no masses,  no organomegaly Extremities: extremities normal, atraumatic, no cyanosis or edema Skin warm and dry. She is anxious  Lab Results:  Results for orders placed or performed during the hospital encounter of 07/31/17 (from the past 48 hour(s))  Comprehensive metabolic panel     Status: Abnormal   Collection Time: 07/31/17  6:26 PM  Result Value Ref Range   Sodium 140 135 - 145 mmol/L   Potassium 4.4 3.5 - 5.1 mmol/L   Chloride 103 101 - 111 mmol/L   CO2 30 22 - 32 mmol/L   Glucose, Bld 122 (H) 65 - 99 mg/dL   BUN 8 6 - 20 mg/dL   Creatinine, Ser 0.81 0.44 - 1.00 mg/dL   Calcium 8.7 (L) 8.9 - 10.3 mg/dL   Total Protein 6.5 6.5 - 8.1 g/dL   Albumin 3.3 (L) 3.5 - 5.0 g/dL   AST 42 (H) 15 - 41 U/L   ALT 35 14 - 54 U/L   Alkaline Phosphatase 126 38 - 126 U/L   Total Bilirubin 0.5 0.3 - 1.2 mg/dL   GFR calc non Af Amer >60 >60 mL/min   GFR calc Af Amer >60 >60 mL/min    Comment: (NOTE) The eGFR has been calculated using the CKD EPI equation. This calculation has not been validated in all clinical situations. eGFR's  persistently <60 mL/min signify possible Chronic Kidney Disease.    Anion gap 7 5 - 15  CBC WITH DIFFERENTIAL     Status: Abnormal   Collection Time: 07/31/17  6:26 PM  Result Value Ref Range   WBC 14.1 (H) 4.0 - 10.5 K/uL   RBC 4.96 3.87 - 5.11 MIL/uL   Hemoglobin 15.1 (H) 12.0 - 15.0 g/dL   HCT 47.7 (H) 36.0 - 46.0 %   MCV 96.2 78.0 - 100.0 fL   MCH 30.4 26.0 - 34.0 pg   MCHC 31.7 30.0 - 36.0 g/dL   RDW 14.9 11.5 - 15.5 %   Platelets 344 150 - 400 K/uL   Neutrophils Relative % 76 %   Neutro Abs 10.7 (H) 1.7 - 7.7 K/uL   Lymphocytes Relative 18 %   Lymphs Abs 2.6 0.7 - 4.0 K/uL   Monocytes Relative 6 %   Monocytes Absolute 0.8 0.1 - 1.0 K/uL   Eosinophils Relative 0 %   Eosinophils Absolute 0.0 0.0 - 0.7 K/uL   Basophils Relative 0 %   Basophils Absolute 0.0 0.0 - 0.1 K/uL    ABGS No results for input(s): PHART, PO2ART, TCO2, HCO3 in the last 72 hours.  Invalid input(s): PCO2 CULTURES No results found for  this or any previous visit (from the past 240 hour(s)). Studies/Results: X-ray Chest Pa And Lateral  Result Date: 07/31/2017 CLINICAL DATA:  Cough and shortness of Breath for several days EXAM: CHEST  2 VIEW COMPARISON:  05/22/2017 FINDINGS: Cardiac shadow is stable. The lungs are well aerated bilaterally. Stable bibasilar changes are noted representing atelectasis/scarring. No focal confluent infiltrate is seen. No sizable effusion is noted. IMPRESSION: Stable bibasilar changes when compared with the prior exam. No new focal abnormality is seen. Electronically Signed   By: Inez Catalina M.D.   On: 07/31/2017 21:42    Medications:  Prior to Admission:  Prescriptions Prior to Admission  Medication Sig Dispense Refill Last Dose  . ALPRAZolam (XANAX) 1 MG tablet Take 1 mg by mouth 4 (four) times daily as needed for anxiety.   3 07/31/2017 at Unknown time  . aspirin EC 81 MG tablet Take 81 mg by mouth daily.   07/31/2017 at Unknown time  . atorvastatin (LIPITOR) 20 MG  tablet Take 20 mg by mouth at bedtime.  1 07/30/2017 at Unknown time  . cetirizine (ZYRTEC) 10 MG tablet Take 10 mg by mouth daily.   07/31/2017 at Unknown time  . doxepin (SINEQUAN) 75 MG capsule take 1 capsule at bedtime  1 07/30/2017 at Unknown time  . doxycycline (VIBRAMYCIN) 100 MG capsule TAKE ONE CAPSULE BY MOUTH TWICE DAILY FOR 10 DAYS STARTING ON 10*17/2018  0 07/31/2017 at Unknown time  . DULERA 200-5 MCG/ACT AERO INHALE TWO PUFFS BY MOUTH TWICE DAILY  12 07/31/2017 at Unknown time  . gabapentin (NEURONTIN) 300 MG capsule Take 900 mg by mouth at bedtime.  2 07/30/2017 at Unknown time  . guaiFENesin (MUCINEX) 600 MG 12 hr tablet Take 1 tablet (600 mg total) by mouth 2 (two) times daily. 20 tablet 2 07/31/2017 at Unknown time  . ipratropium-albuterol (DUONEB) 0.5-2.5 (3) MG/3ML SOLN Take 3 mLs by nebulization 4 (four) times daily. 360 mL 12 07/31/2017 at Unknown time  . lamoTRIgine (LAMICTAL) 150 MG tablet Take 300 mg by mouth at bedtime.  3 07/30/2017 at Unknown time  . meloxicam (MOBIC) 15 MG tablet TAKE 1 TABLET(15 MG) BY MOUTH DAILY 30 tablet 1 07/31/2017 at Unknown time  . mirtazapine (REMERON) 45 MG tablet Take 45 mg by mouth at bedtime.  3 07/30/2017 at Unknown time  . montelukast (SINGULAIR) 10 MG tablet Take 10 mg by mouth every evening.  2 07/30/2017 at Unknown time  . pantoprazole (PROTONIX) 40 MG tablet Take 1 tablet by mouth every morning.    07/31/2017 at Unknown time  . perphenazine (TRILAFON) 8 MG tablet Take 32 mg by mouth at bedtime.   3 07/30/2017 at Unknown time  . predniSONE (DELTASONE) 10 MG tablet Take 10 mg by mouth daily.   5 07/31/2017 at Unknown time  . PROAIR HFA 108 (90 Base) MCG/ACT inhaler Inhale 2 puffs into the lungs every 4 (four) hours as needed. Shortness of breath  2 07/31/2017 at Unknown time  . propranolol (INDERAL) 10 MG tablet Take 1 tablet by mouth 2 (two) times daily.  1 07/31/2017 at 900A  . SAPHRIS 10 MG SUBL Place 20 mg under the tongue every  evening.   3 07/30/2017 at Unknown time  . tiZANidine (ZANAFLEX) 2 MG tablet TAKE 1 TABLET(2 MG) BY MOUTH EVERY 8 HOURS AS NEEDED FOR MUSCLE SPASMS (Patient taking differently: TAKE 1 TABLET(2 MG) BY MOUTH TWICE DAILY) 90 tablet 2 07/31/2017 at Unknown time   Scheduled: . ALPRAZolam  1 mg Oral QID  . asenapine  20 mg Sublingual QPM  . aspirin EC  81 mg Oral Daily  . atorvastatin  20 mg Oral QHS  . doxepin  75 mg Oral QHS  . enoxaparin (LOVENOX) injection  40 mg Subcutaneous Q24H  . gabapentin  900 mg Oral QHS  . guaiFENesin  600 mg Oral BID  . ipratropium-albuterol  3 mL Nebulization QID  . lamoTRIgine  300 mg Oral QHS  . loratadine  10 mg Oral Daily  . mouth rinse  15 mL Mouth Rinse BID  . meloxicam  15 mg Oral Daily  . methylPREDNISolone (SOLU-MEDROL) injection  40 mg Intravenous Q6H  . mirtazapine  45 mg Oral QHS  . mometasone-formoterol  2 puff Inhalation BID  . montelukast  10 mg Oral QPM  . pantoprazole  40 mg Oral QHS  . perphenazine  32 mg Oral QHS  . propranolol  10 mg Oral BID   Continuous: . sodium chloride 75 mL/hr at 08/02/17 0007  . cefTRIAXone (ROCEPHIN)  IV Stopped (08/01/17 1903)   XTK:[id number] **OR** acetaminophen, albuterol, ondansetron **OR** ondansetron (ZOFRAN) IV, tiZANidine  Assesment: She was admitted with COPD exacerbation and acute on chronic hypoxic respiratory failure. Her lungs are clear now and she wants to go home.  She has personal history of stroke and she will need speech evaluation at some point.  She has bipolar disease which is stable Active Problems:   Acute on chronic respiratory failure with hypoxia (HCC)   Bipolar 1 disorder, mixed, moderate (HCC)   Personal history of stroke with current residual effects   Essential hypertension   GERD (gastroesophageal reflux disease)   COPD with acute exacerbation ([place])    Plan: Discharge home    LOS: 0 days   Alicia Dixon L 08/02/2017, 8:42 AM

## 2017-08-03 NOTE — Discharge Summary (Signed)
Physician Discharge Summary  Patient ID: Alicia Dixon MRN: [id number] DOB/AGE: [birth year] 51 y.o. Primary Care Physician:Baucom, Shaune PollackJenny B, PA-C Admit date: 07/31/2017 Discharge date: 08/03/2017    Discharge Diagnoses:   Active Problems:   Acute on chronic respiratory failure with hypoxia (HCC)   Bipolar 1 disorder, mixed, moderate (HCC)   Personal history of stroke with current residual effects   Essential hypertension   GERD (gastroesophageal reflux disease)   COPD with acute exacerbation (HCC)   Allergies as of 08/02/2017      Reactions   Other Anaphylaxis   Allergic to melons   Baclofen    hallucinations   Flonase  [fluticasone Propionate] Other (See Comments)   Nose swelling and nose bleeds   Gadopentetate Dimeglumine Other (See Comments)   "purple" discoloration, slight numbness to left fifth finger, "purple dots" on the top of left hand following MRI exam 07/22/13 with Magnevist contrast.  Radiologist did not feel this was related to contrast.   Lortab [hydrocodone-acetaminophen]    N/v hives   Seroquel [quetiapine Fumarate]    n/v   Sudafed [pseudoephedrine Hcl]    blisters   Trazodone And Nefazodone Nausea And Vomiting      Medication List    STOP taking these medications   doxycycline 100 MG capsule Commonly known as:  VIBRAMYCIN     TAKE these medications   ALPRAZolam 1 MG tablet Commonly known as:  XANAX Take 1 mg by mouth 4 (four) times daily as needed for anxiety.   aspirin EC 81 MG tablet Take 81 mg by mouth daily.   atorvastatin 20 MG tablet Commonly known as:  LIPITOR Take 20 mg by mouth at bedtime.   cephALEXin 500 MG capsule Commonly known as:  KEFLEX Take 1 capsule (500 mg total) by mouth 3 (three) times daily.   cetirizine 10 MG tablet Commonly known as:  ZYRTEC Take 10 mg by mouth daily.   doxepin 75 MG capsule Commonly known as:  SINEQUAN take 1 capsule at bedtime   DULERA 200-5 MCG/ACT Aero Generic drug:   mometasone-formoterol INHALE TWO PUFFS BY MOUTH TWICE DAILY   gabapentin 300 MG capsule Commonly known as:  NEURONTIN Take 900 mg by mouth at bedtime.   guaiFENesin 600 MG 12 hr tablet Commonly known as:  MUCINEX Take 1 tablet (600 mg total) by mouth 2 (two) times daily.   ipratropium-albuterol 0.5-2.5 (3) MG/3ML Soln Commonly known as:  DUONEB Take 3 mLs by nebulization 4 (four) times daily.   lamoTRIgine 150 MG tablet Commonly known as:  LAMICTAL Take 300 mg by mouth at bedtime.   meloxicam 15 MG tablet Commonly known as:  MOBIC TAKE 1 TABLET(15 MG) BY MOUTH DAILY   mirtazapine 45 MG tablet Commonly known as:  REMERON Take 45 mg by mouth at bedtime.   montelukast 10 MG tablet Commonly known as:  SINGULAIR Take 10 mg by mouth every evening.   pantoprazole 40 MG tablet Commonly known as:  PROTONIX Take 1 tablet by mouth every morning.   perphenazine 8 MG tablet Commonly known as:  TRILAFON Take 32 mg by mouth at bedtime.   predniSONE 10 MG tablet Commonly known as:  DELTASONE Take 10 mg by mouth daily.   PROAIR HFA 108 (90 Base) MCG/ACT inhaler Generic drug:  albuterol Inhale 2 puffs into the lungs every 4 (four) hours as needed. Shortness of breath   propranolol 10 MG tablet Commonly known as:  INDERAL Take 1 tablet by mouth 2 (two) times daily.  SAPHRIS 10 MG Subl Generic drug:  Asenapine Maleate Place 20 mg under the tongue every evening.   tiZANidine 2 MG tablet Commonly known as:  ZANAFLEX TAKE 1 TABLET(2 MG) BY MOUTH EVERY 8 HOURS AS NEEDED FOR MUSCLE SPASMS What changed:  See the new instructions.       Discharged Condition:improved    Consults:none  Significant Diagnostic Studies: X-ray Chest Pa And Lateral  Result Date: 07/31/2017 CLINICAL DATA:  Cough and shortness of Breath for several days EXAM: CHEST  2 VIEW COMPARISON:  05/22/2017 FINDINGS: Cardiac shadow is stable. The lungs are well aerated bilaterally. Stable bibasilar changes  are noted representing atelectasis/scarring. No focal confluent infiltrate is seen. No sizable effusion is noted. IMPRESSION: Stable bibasilar changes when compared with the prior exam. No new focal abnormality is seen. Electronically Signed   By: Alcide Clever M.D.   On: 07/31/2017 21:42    Lab Results: Basic Metabolic Panel:  Recent Labs  16/10/96 1826  NA 140  K 4.4  CL 103  CO2 30  GLUCOSE 122*  BUN 8  CREATININE 0.81  CALCIUM 8.7*   Liver Function Tests:  Recent Labs  07/31/17 1826  AST 42*  ALT 35  ALKPHOS 126  BILITOT 0.5  PROT 6.5  ALBUMIN 3.3*     CBC:  Recent Labs  07/31/17 1826  WBC 14.1*  NEUTROABS 10.7*  HGB 15.1*  HCT 47.7*  MCV 96.2  PLT 344    No results found for this or any previous visit (from the past 240 hour(s)).   Hospital Course: This is a 51 year old who came to my office complaining of increasing shortness of breath despite being on prednisone and antibiotics. She is known to have severe COPD at baseline. She has chronic hypoxic respiratory failure at baseline. I admitted her from my office to observation and start her on IV antibiotics and IV steroids. She improved. Chest x-ray did not show pneumonia and I personally reviewed that. She improved rapidly and after approximately 48 hours in the hospital felt well enough to go home. Her lungs were clear at the time of discharge  Discharge Exam: Blood pressure 133/90, pulse 93, temperature 98.4 F (36.9 C), temperature source Oral, resp. rate 16, height 5\' 8"  (1.727 m), weight 103.9 kg (229 lb 0.9 oz), SpO2 93 %. She is awake and alert. Her chest is clear. Heart is regular. She is in no distress.  Disposition: Home she does not want home health services  Discharge Instructions    Call MD for:  difficulty breathing, headache or visual disturbances    Complete by:  As directed    Call MD for:  persistant nausea and vomiting    Complete by:  As directed    Call MD for:  temperature >100.4     Complete by:  As directed    Diet - low sodium heart healthy    Complete by:  As directed    Increase activity slowly    Complete by:  As directed         Signed: [name]   08/03/2017, 10:09 AM

## 2017-08-08 ENCOUNTER — Encounter: Payer: Medicare Other | Admitting: Physical Medicine & Rehabilitation

## 2017-08-09 ENCOUNTER — Other Ambulatory Visit: Payer: Self-pay

## 2017-08-09 NOTE — Telephone Encounter (Signed)
Patient called wondering if Dr.Patel is still going to do his injection for upcoming appointment 08/14/17. Patient is on antibiotics.

## 2017-08-11 NOTE — Telephone Encounter (Signed)
Based on chart review, it appears she is on abx for a respiratory infection.  In this case, we will proceed with the injection.  Thanks.

## 2017-08-12 NOTE — Telephone Encounter (Signed)
Notified Ms Mayford Knifeurner.

## 2017-08-14 ENCOUNTER — Ambulatory Visit: Payer: Medicare Other | Admitting: Physical Medicine & Rehabilitation

## 2017-08-21 ENCOUNTER — Other Ambulatory Visit: Payer: Self-pay | Admitting: Physical Medicine & Rehabilitation

## 2017-08-22 ENCOUNTER — Other Ambulatory Visit: Payer: Self-pay

## 2017-08-22 ENCOUNTER — Encounter: Payer: Self-pay | Admitting: Physical Medicine & Rehabilitation

## 2017-08-22 ENCOUNTER — Encounter: Payer: Medicare Other | Attending: Physical Medicine & Rehabilitation | Admitting: Physical Medicine & Rehabilitation

## 2017-08-22 VITALS — BP 107/73 | HR 103

## 2017-08-22 DIAGNOSIS — J449 Chronic obstructive pulmonary disease, unspecified: Secondary | ICD-10-CM | POA: Insufficient documentation

## 2017-08-22 DIAGNOSIS — F319 Bipolar disorder, unspecified: Secondary | ICD-10-CM | POA: Diagnosis not present

## 2017-08-22 DIAGNOSIS — M545 Low back pain: Secondary | ICD-10-CM | POA: Insufficient documentation

## 2017-08-22 DIAGNOSIS — G479 Sleep disorder, unspecified: Secondary | ICD-10-CM | POA: Insufficient documentation

## 2017-08-22 DIAGNOSIS — M791 Myalgia, unspecified site: Secondary | ICD-10-CM | POA: Diagnosis not present

## 2017-08-22 DIAGNOSIS — Z9981 Dependence on supplemental oxygen: Secondary | ICD-10-CM | POA: Insufficient documentation

## 2017-08-22 DIAGNOSIS — I10 Essential (primary) hypertension: Secondary | ICD-10-CM | POA: Insufficient documentation

## 2017-08-22 DIAGNOSIS — F17210 Nicotine dependence, cigarettes, uncomplicated: Secondary | ICD-10-CM | POA: Diagnosis not present

## 2017-08-22 DIAGNOSIS — R269 Unspecified abnormalities of gait and mobility: Secondary | ICD-10-CM | POA: Insufficient documentation

## 2017-08-22 NOTE — Progress Notes (Signed)
Subjective:    Patient ID: Alicia Dixon, female    DOB: 1966/06/28, 51 y.o.   MRN: [id number]  HPI  51 y/o female with pmh of COPD on supplemental O2, HTN, coagulopathy, bipolar disorderTIA who presents for follow up for b/l low back pain R>L.  Initially patient stated: Started ~10 years ago, getting progressively worse.  No alleviating factors.  Prolonged postures exacerbate the pain.  It is sharp and dull, constant.  Non-radiating. Today, severity is 10/10.  Denies associated numbness, tingling weakness. She has tried IBU, percocet, tylenol without benefit. She was seeing a previous pain management injections, she had trigger point injection, which helped.  She has had falls because of knee instability.  Pain limits activities of daily living.    Last clinic visit 05/22/17. Since last visit, pt was admitted to the hospital for dehydration and PNA.  She states she is doing better now. She restarted her medications.  Her portable O2 was fixed. Her syncopal episodes have resolved.    Pain Inventory Average Pain 8 Pain Right Now 10 My pain is sharp, stabbing and aching  In the last 24 hours, has pain interfered with the following? General activity 3 Relation with others 5 Enjoyment of life 6 What TIME of day is your pain at its worst? all,daytime, night Sleep (in general) depends on pain level  Pain is worse with: walking, standing and some activites Pain improves with: rest, heat/ice, medication, TENS and injections Relief from Meds: 4  Mobility walk without assistance use a cane how many minutes can you walk? 5 ability to climb steps?  yes do you drive?  yes  Function I need assistance with the following:  . Do you have any goals in this area?  no  Neuro/Psych No problems in this area  Prior Studies Any changes since last visit?  no  Physicians involved in your care Any changes since last visit?  no   Family History  Problem Relation Age of Onset  . Colon cancer  Maternal Aunt    Social History   Socioeconomic History  . Marital status: Divorced    Spouse name: Not on file  . Number of children: Not on file  . Years of education: Not on file  . Highest education level: Not on file  Social Needs  . Financial resource strain: Not on file  . Food insecurity - worry: Not on file  . Food insecurity - inability: Not on file  . Transportation needs - medical: Not on file  . Transportation needs - non-medical: Not on file  Occupational History  . Not on file  Tobacco Use  . Smoking status: Current Every Day Smoker    Packs/day: 0.25    Years: 35.00    Pack years: 8.75    Types: Cigarettes  . Smokeless tobacco: Never Used  Substance and Sexual Activity  . Alcohol use: No  . Drug use: No  . Sexual activity: Yes    Birth control/protection: Surgical  Other Topics Concern  . Not on file  Social History Narrative  . Not on file   Past Surgical History:  Procedure Laterality Date  . ABDOMINAL HYSTERECTOMY    . APPENDECTOMY    . CESAREAN SECTION    . CHOLECYSTECTOMY    . LUNG LOBECTOMY     right lung    Past Medical History:  Diagnosis Date  . Asthma   . Bipolar disorder (HCC)   . COPD (chronic obstructive pulmonary disease) (HCC)   .  Depression   . Stroke [facility](HCC)    There were no vitals taken for this visit.  Opioid Risk Score:   Fall Risk Score:  `1  Depression screen PHQ 2/9  [facility] 08/22/2017 01/11/2017 06/07/2016 05/25/2016  Decreased Interest 0 1 1 2   Down, Depressed, Hopeless 0 1 1 0  PHQ - 2 Score 0 2 2 2   Altered sleeping - - - 3  Tired, decreased energy - - - 3  Change in appetite - - - 1  Feeling bad or failure about yourself  - - - 2  Trouble concentrating - - - 0  Moving slowly or fidgety/restless - - - 1  Suicidal thoughts - - - 0  PHQ-9 Score - - - 12    Review of Systems  Constitutional: Negative.   HENT: Negative.   Eyes: Negative.   Respiratory: Positive for cough, shortness of  breath and wheezing.        Oxygen therapy Respiratory infections   Cardiovascular: Negative.   Gastrointestinal: Negative.   Endocrine: Negative.   Genitourinary: Negative.   Musculoskeletal: Positive for arthralgias and gait problem.  Skin: Negative.   Allergic/Immunologic: Negative.   Hematological: Negative.   Psychiatric/Behavioral: Positive for dysphoric mood. The patient is nervous/anxious.   All other systems reviewed and are negative.     Objective:   Physical Exam  Gen: NAD. Vital signs reviewed HENT: Normocephalic, Atraumatic. Facial edema improving Eyes: EOMI. No discharge.  Cardio: + Tachycardic, regular rhythm. No JVD. Pulm: B/l clear to auscultation.  Effort normal. +Supplemental O2. Abd: Soft, BS+ MSK:  Gait mildly antaglic  TTP R>L lumbar paraspinal, gluteal muscles.  No edema.   Neg fortin finger Neuro: Sensation intact to light touch in all LE dermatomes  Strength  4/5 in all LLE myotomes     4-/5 in all RLE myotomes Skin: Warm and Dry. Intact.    Assessment & Plan:  51 y/o female with pmh of COPD on supplemental O2, HTN, coagulopathy, bipolar disorder, TIA who presents for follow up of b/l low back pain R>L.   1. Mechanical R>L low back pain  She states she does not have access and a phobia to pools  Cont Heat/Cold   Cont HEP, completed PT  Cont TENS  Cont Mobic 15mg  daily with food, educated pt not to take other NSAIDs concomitantly.   Cont Tizanidine 2mg  BID PRN.   Will consider bracing in future  2. COPD  Cont supplemental O2, 2L  Will be weary with benzo/TCA/narcotic administration  3. Sleep disturbance  Due to pain  Cont meds per Psych and Tizanidine  4. Myalgia  Will perform on next visit  5. Tobacco abuse  Educated on continuing to cut back, currently 2/day  6. Bipolar disorder  Managed by Psych - pt on Gabapentin qHS, Xanax, and lamictal  7. Abnormality of gait  Cont cane, encouraged compliance  8. Syncope  Resolved

## 2017-08-22 NOTE — Progress Notes (Signed)
Trigger point injection procedure note: Trigger Point Injection: Written consent was obtained for the patient. Trigger points were identified of right lumbosacral PSPs and gluteal muscles. The areas were cleaned with alcohol, and each of  these trigger points were sprayed with vapocoolant spray and then injected with 1 cc of 0.5% Marcaine (x4). Needle draw back was performed. There were no complications from the procedure, and it was well tolerated.

## 2017-09-19 ENCOUNTER — Encounter: Payer: Medicare Other | Admitting: Physical Medicine & Rehabilitation

## 2017-09-26 ENCOUNTER — Encounter: Payer: Self-pay | Admitting: Physical Medicine & Rehabilitation

## 2017-09-26 ENCOUNTER — Encounter: Payer: Medicare Other | Attending: Physical Medicine & Rehabilitation | Admitting: Physical Medicine & Rehabilitation

## 2017-09-26 VITALS — BP 105/70 | HR 93

## 2017-09-26 DIAGNOSIS — J449 Chronic obstructive pulmonary disease, unspecified: Secondary | ICD-10-CM | POA: Diagnosis not present

## 2017-09-26 DIAGNOSIS — G479 Sleep disorder, unspecified: Secondary | ICD-10-CM | POA: Insufficient documentation

## 2017-09-26 DIAGNOSIS — M545 Low back pain: Secondary | ICD-10-CM | POA: Diagnosis not present

## 2017-09-26 DIAGNOSIS — F319 Bipolar disorder, unspecified: Secondary | ICD-10-CM | POA: Insufficient documentation

## 2017-09-26 DIAGNOSIS — Z9981 Dependence on supplemental oxygen: Secondary | ICD-10-CM | POA: Diagnosis not present

## 2017-09-26 DIAGNOSIS — I10 Essential (primary) hypertension: Secondary | ICD-10-CM | POA: Insufficient documentation

## 2017-09-26 DIAGNOSIS — W19XXXA Unspecified fall, initial encounter: Secondary | ICD-10-CM

## 2017-09-26 DIAGNOSIS — F17210 Nicotine dependence, cigarettes, uncomplicated: Secondary | ICD-10-CM | POA: Diagnosis not present

## 2017-09-26 DIAGNOSIS — R269 Unspecified abnormalities of gait and mobility: Secondary | ICD-10-CM | POA: Insufficient documentation

## 2017-09-26 DIAGNOSIS — M791 Myalgia, unspecified site: Secondary | ICD-10-CM

## 2017-09-26 NOTE — Progress Notes (Signed)
Trigger point injection procedure note: Trigger Point Injection: Written consent was obtained for the patient. Trigger points were identified of right thoracolumbosacral PSPs and bilateral gluteal muscles. The areas were cleaned with alcohol, and each of  these trigger points were sprayed with vapocoolant spray and then injected with 1 cc of 0.5% Marcaine (x5). Needle draw back was performed. There were no complications from the procedure, and it was well tolerated.

## 2017-09-26 NOTE — Progress Notes (Signed)
Subjective:    Patient ID: Alicia Dixon, female    DOB: 1965-10-15, 51 y.o.   MRN: [id number]  HPI  51 y/o female with pmh of COPD on supplemental O2, HTN, coagulopathy, bipolar disorderTIA who presents for follow up for b/l low back pain R>L.  Initially patient stated: Started ~10 years ago, getting progressively worse.  No alleviating factors.  Prolonged postures exacerbate the pain.  It is sharp and dull, constant.  Non-radiating. Today, severity is 10/10.  Denies associated numbness, tingling weakness. She has tried IBU, percocet, tylenol without benefit. She was seeing a previous pain management injections, she had trigger point injection, which helped.  She has had falls because of knee instability.  Pain limits activities of daily living.    Last clinic visit 05/22/17. Since last visit, pt was admitted to the hospital for dehydration and PNA.  She states she is doing better now. She restarted her medications.  Her portable O2 was fixed. Her syncopal episodes have resolved.    Pain Inventory Average Pain 9 Pain Right Now 10 My pain is constant, sharp, dull, stabbing and aching  In the last 24 hours, has pain interfered with the following? General activity 8 Relation with others 9 Enjoyment of life 10 What TIME of day is your pain at its worst? morning,daytime, night Sleep (in general) Poor  Pain is worse with: walking, sitting and standing Pain improves with: rest and therapy/exercise Relief from Meds: 2  Mobility walk without assistance how many minutes can you walk? 5 ability to climb steps?  no do you drive?  yes  Function Do you have any goals in this area?  no  Neuro/Psych trouble walking dizziness depression anxiety  Prior Studies Any changes since last visit?  no  Physicians involved in your care [facility], [facility]   Family History  Problem Relation Age of Onset  . Colon cancer Maternal Aunt    Social History   Socioeconomic History   . Marital status: Divorced    Spouse name: None  . Number of children: None  . Years of education: None  . Highest education level: None  Social Needs  . Financial resource strain: None  . Food insecurity - worry: None  . Food insecurity - inability: None  . Transportation needs - medical: None  . Transportation needs - non-medical: None  Occupational History  . None  Tobacco Use  . Smoking status: Current Every Day Smoker    Packs/day: 0.25    Years: 35.00    Pack years: 8.75    Types: Cigarettes  . Smokeless tobacco: Never Used  Substance and Sexual Activity  . Alcohol use: No  . Drug use: No  . Sexual activity: Yes    Birth control/protection: Surgical  Other Topics Concern  . None  Social History Narrative  . None   Past Surgical History:  Procedure Laterality Date  . ABDOMINAL HYSTERECTOMY    . APPENDECTOMY    . CESAREAN SECTION    . CHOLECYSTECTOMY    . LUNG LOBECTOMY     right lung    Past Medical History:  Diagnosis Date  . Asthma   . Bipolar disorder (HCC)   . COPD (chronic obstructive pulmonary disease) (HCC)   . Depression   . Stroke [facility](HCC)    There were no vitals taken for this visit.  Opioid Risk Score:   Fall Risk Score:  `1  Depression screen PHQ 2/9  [facility] 08/22/2017  01/11/2017 06/07/2016 05/25/2016  Decreased Interest 0 1 1 2   Down, Depressed, Hopeless 0 1 1 0  PHQ - 2 Score 0 2 2 2   Altered sleeping - - - 3  Tired, decreased energy - - - 3  Change in appetite - - - 1  Feeling bad or failure about yourself  - - - 2  Trouble concentrating - - - 0  Moving slowly or fidgety/restless - - - 1  Suicidal thoughts - - - 0  PHQ-9 Score - - - 12    Review of Systems  Constitutional: Negative.   HENT: Negative.   Eyes: Negative.   Respiratory: Positive for cough, shortness of breath and wheezing.        Oxygen therapy Respiratory infections   Cardiovascular: Negative.   Gastrointestinal: Negative.   Endocrine:  Negative.   Genitourinary: Positive for difficulty urinating and dysuria.  Musculoskeletal: Positive for arthralgias and gait problem.  Skin: Negative.   Allergic/Immunologic: Negative.   Hematological: Negative.   Psychiatric/Behavioral: Positive for dysphoric mood. The patient is nervous/anxious.   All other systems reviewed and are negative.     Objective:   Physical Exam  Gen: NAD. Vital signs reviewed HENT: Normocephalic, Atraumatic. Facial edema improving Eyes: EOMI. No discharge.  Cardio: + Tachycardic, regular rhythm. No JVD. Pulm: B/l clear to auscultation.  Effort normal. +Supplemental O2. Abd: Soft, BS+ MSK:  Gait mildly antaglic  TTP R>L lumbar paraspinal, gluteal muscles.  No edema.   Neg fortin finger Neuro: Sensation intact to light touch in all LE dermatomes  Strength  4/5 in all LLE myotomes     4-/5 in all RLE myotomes Skin: Warm and Dry. Intact.    Assessment & Plan:  51 y/o female with pmh of COPD on supplemental O2, HTN, coagulopathy, bipolar disorder, TIA who presents for follow up of b/l low back pain R>L.   1. Mechanical R>L low back pain  She states she does not have access and a phobia to pools  Cont Heat/Cold   Cont HEP, completed PT  Cont TENS  Cont Mobic 15mg  daily with food, educated pt not to take other NSAIDs concomitantly.   Cont Tizanidine 2mg  BID PRN.   Will consider bracing in future  2. COPD  Cont supplemental O2, 2L  Will be weary with benzo/TCA/narcotic administration  3. Sleep disturbance  Due to pain  Cont meds per Psych and Tizanidine  4. Myalgia  Will perform on next visit  5. Tobacco abuse  Educated on continuing to cut back, currently 2/day  6. Bipolar disorder  Managed by Psych - pt on Gabapentin qHS, Xanax, and lamictal  7. Abnormality of gait  Cont cane, encouraged compliance  8. Syncope  Resolved

## 2017-10-10 ENCOUNTER — Other Ambulatory Visit: Payer: Self-pay | Admitting: Physical Medicine & Rehabilitation

## 2017-10-23 ENCOUNTER — Encounter: Payer: Medicare Other | Admitting: Physical Medicine & Rehabilitation

## 2017-10-24 IMAGING — DX DG CHEST 2V
2 series · 2 of 2 positions shown · non-contrast
Comparison: Radiograph 04/17/2017, CT 03/13/2017

CLINICAL DATA: COPD.  Acute on chronic shortness of breath.

EXAM:
CHEST  2 VIEW

[chest pa]
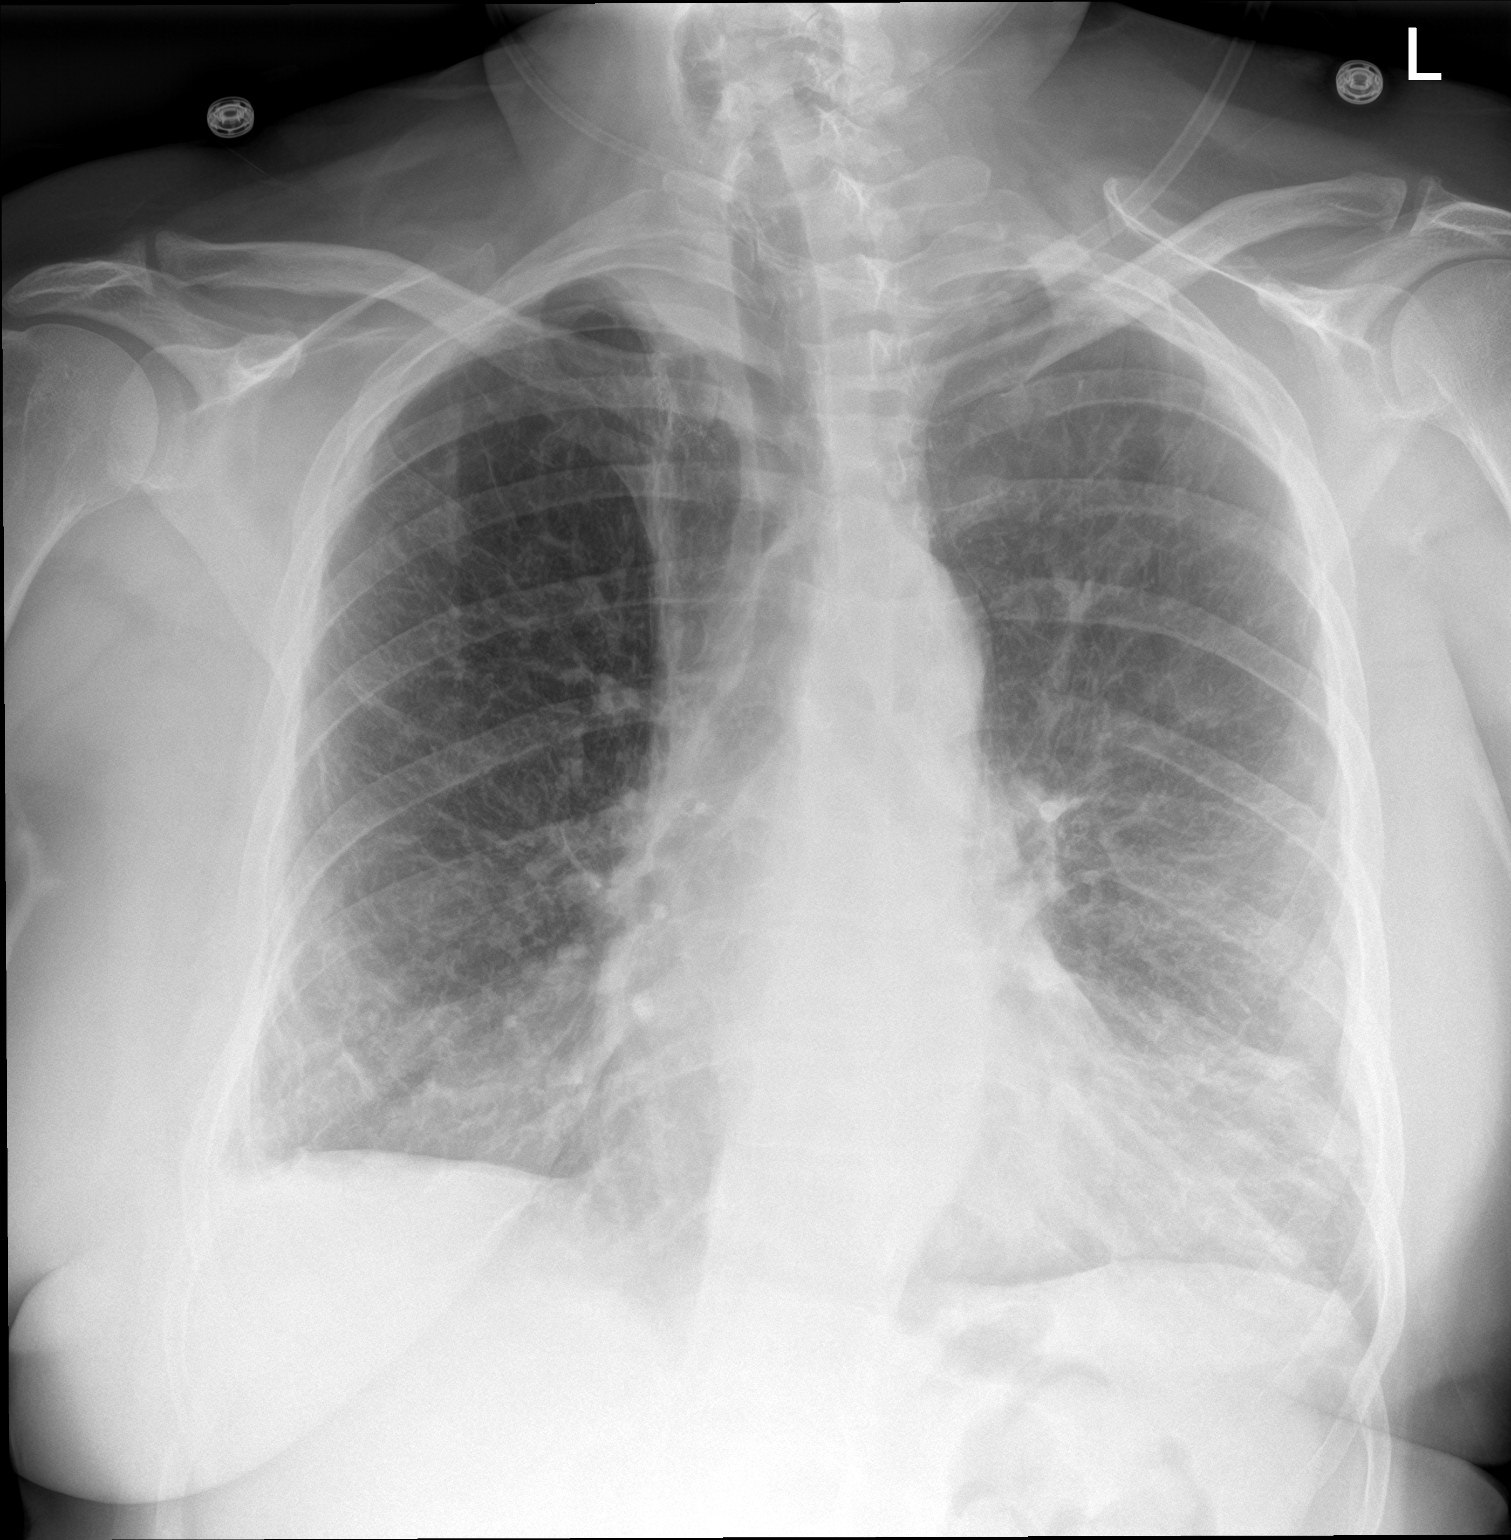

[chest lat]
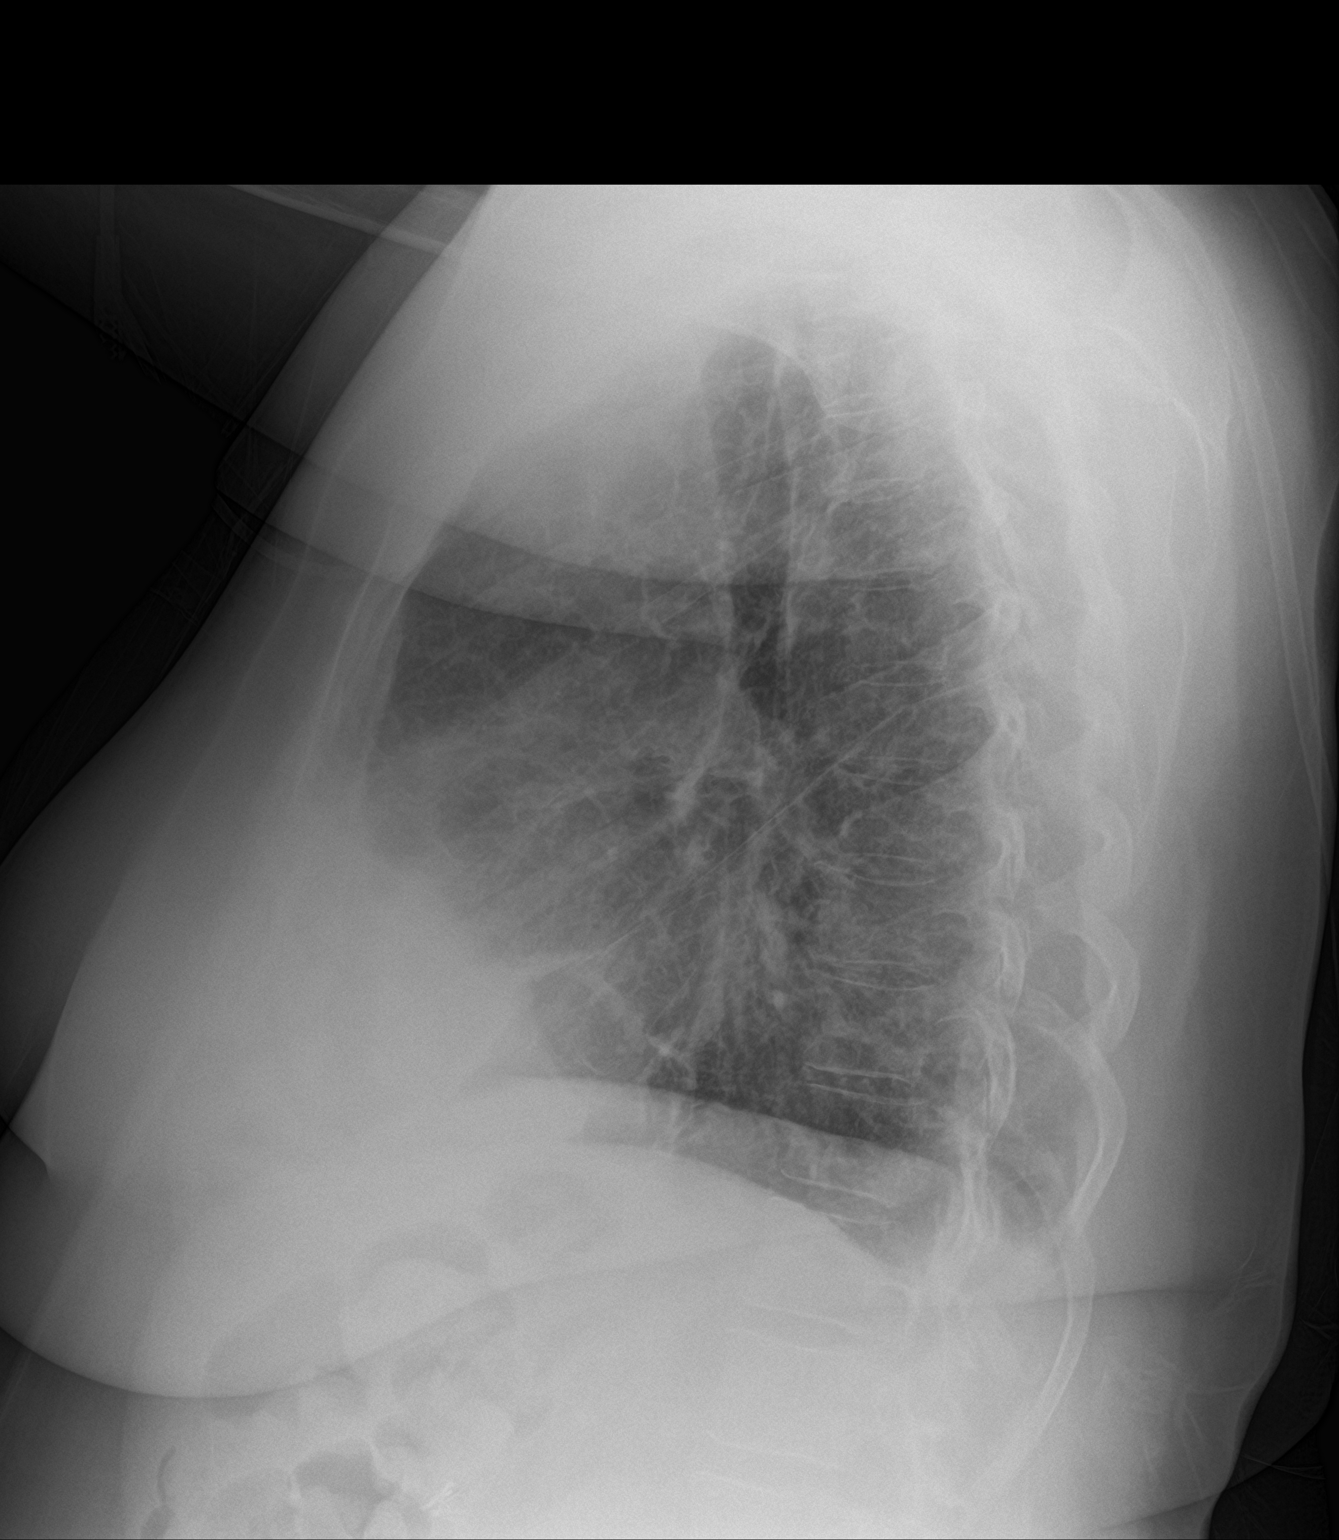

[2 of 2 positions shown; findings below may reference images not displayed]

FINDINGS: Unchanged heart size and mediastinal contours. Emphysema with
postsurgical volume loss in the right lung. Worsening bibasilar
atelectasis and increasing bronchial thickening. No confluent
airspace disease. No pleural effusion or pneumothorax. Stable
osseous structures.
IMPRESSION: 1. Worsening bibasilar atelectasis and progressive bronchial
thickening.
2. Emphysema.

## 2017-10-25 ENCOUNTER — Ambulatory Visit: Payer: Medicare Other | Admitting: Physical Medicine & Rehabilitation

## 2017-10-30 ENCOUNTER — Encounter: Payer: Medicare Other | Admitting: Physical Medicine & Rehabilitation

## 2017-11-03 ENCOUNTER — Other Ambulatory Visit: Payer: Self-pay | Admitting: Physical Medicine & Rehabilitation

## 2017-11-13 ENCOUNTER — Encounter: Payer: Self-pay | Admitting: Physical Medicine & Rehabilitation

## 2017-11-13 ENCOUNTER — Encounter: Payer: Medicare Other | Attending: Physical Medicine & Rehabilitation | Admitting: Physical Medicine & Rehabilitation

## 2017-11-13 VITALS — BP 103/64 | HR 89

## 2017-11-13 DIAGNOSIS — R269 Unspecified abnormalities of gait and mobility: Secondary | ICD-10-CM | POA: Diagnosis not present

## 2017-11-13 DIAGNOSIS — Z9981 Dependence on supplemental oxygen: Secondary | ICD-10-CM | POA: Insufficient documentation

## 2017-11-13 DIAGNOSIS — G8929 Other chronic pain: Secondary | ICD-10-CM | POA: Diagnosis not present

## 2017-11-13 DIAGNOSIS — I10 Essential (primary) hypertension: Secondary | ICD-10-CM | POA: Insufficient documentation

## 2017-11-13 DIAGNOSIS — F319 Bipolar disorder, unspecified: Secondary | ICD-10-CM | POA: Diagnosis not present

## 2017-11-13 DIAGNOSIS — F17210 Nicotine dependence, cigarettes, uncomplicated: Secondary | ICD-10-CM | POA: Insufficient documentation

## 2017-11-13 DIAGNOSIS — Z716 Tobacco abuse counseling: Secondary | ICD-10-CM | POA: Diagnosis not present

## 2017-11-13 DIAGNOSIS — M545 Low back pain: Secondary | ICD-10-CM | POA: Diagnosis not present

## 2017-11-13 DIAGNOSIS — Z720 Tobacco use: Secondary | ICD-10-CM

## 2017-11-13 DIAGNOSIS — G479 Sleep disorder, unspecified: Secondary | ICD-10-CM | POA: Diagnosis not present

## 2017-11-13 DIAGNOSIS — J449 Chronic obstructive pulmonary disease, unspecified: Secondary | ICD-10-CM | POA: Diagnosis not present

## 2017-11-13 MED ORDER — MELOXICAM 15 MG PO TABS: ORAL_TABLET | ORAL | 1 refills | Status: DC

## 2017-11-13 MED ORDER — TIZANIDINE HCL 4 MG PO TABS: 4.0000 mg | ORAL_TABLET | Freq: Three times a day (TID) | ORAL | 1 refills | Status: DC | PRN

## 2017-11-13 NOTE — Progress Notes (Signed)
_  Subjective:    Patient ID: Alicia Dixon, female    DOB: 1966-03-17, 52 y.o.   MRN: [id number]  HPI  52 y/o female with pmh of COPD on supplemental O2, HTN, coagulopathy, bipolar disorderTIA who presents for follow up for b/l low back pain R>L.  Initially patient stated: Started ~10 years ago, getting progressively worse.  No alleviating factors.  Prolonged postures exacerbate the pain.  It is sharp and dull, constant.  Non-radiating. Today, severity is 10/10.  Denies associated numbness, tingling weakness. She has tried IBU, percocet, tylenol without benefit. She was seeing a previous pain management injections, she had trigger point injection, which helped.  She has had falls because of knee instability.  Pain limits activities of daily living.    Last clinic visit 09/26/17. Since last visit, pt states she has had a cough recently.  She had a mechanical fall running after her grand daughter. She no longer requires a cane and has not obtained the rollator. Muscle spasms have not been controlled recently.  Pain Inventory Average Pain 9 Pain Right Now 7 My pain is constant, sharp, dull, stabbing and tingling  In the last 24 hours, has pain interfered with the following? General activity 4 Relation with others 3 Enjoyment of life 3 What TIME of day is your pain at its worst? morning, daytime, evening, night Sleep (in general) Poor  Pain is worse with: walking, bending and standing Pain improves with: rest, heat/ice and TENS Relief from Meds: 3  Mobility walk without assistance how many minutes can you walk? 6-10 ability to climb steps?  yes do you drive?  yes transfers alone Do you have any goals in this area?  yes  Function Do you have any goals in this area?  no  Neuro/Psych spasms depression anxiety  Prior Studies Any changes since last visit?  no  Physicians involved in your care Any changes since last visit?  no   Family History  Problem Relation Age of  Onset  . Colon cancer Maternal Aunt    Social History   Socioeconomic History  . Marital status: Divorced    Spouse name: None  . Number of children: None  . Years of education: None  . Highest education level: None  Social Needs  . Financial resource strain: None  . Food insecurity - worry: None  . Food insecurity - inability: None  . Transportation needs - medical: None  . Transportation needs - non-medical: None  Occupational History  . None  Tobacco Use  . Smoking status: Current Every Day Smoker    Packs/day: 0.25    Years: 35.00    Pack years: 8.75    Types: Cigarettes  . Smokeless tobacco: Never Used  Substance and Sexual Activity  . Alcohol use: No  . Drug use: No  . Sexual activity: Yes    Birth control/protection: Surgical  Other Topics Concern  . None  Social History Narrative  . None   Past Surgical History:  Procedure Laterality Date  . ABDOMINAL HYSTERECTOMY    . APPENDECTOMY    . CESAREAN SECTION    . CHOLECYSTECTOMY    . LUNG LOBECTOMY     right lung    Past Medical History:  Diagnosis Date  . Asthma   . Bipolar disorder (HCC)   . COPD (chronic obstructive pulmonary disease) (HCC)   . Depression   . Stroke (HCC)    BP 103/64 (BP Location: Left Arm, Patient Position: Sitting, Cuff Size:  Normal)   Pulse 89   SpO2 (!) 89%   Opioid Risk Score:   Fall Risk Score:  `1  Depression screen PHQ 2/9  [facility] 08/22/2017 01/11/2017 06/07/2016 05/25/2016  Decreased Interest 0 1 1 2   Down, Depressed, Hopeless 0 1 1 0  PHQ - 2 Score 0 2 2 2   Altered sleeping - - - 3  Tired, decreased energy - - - 3  Change in appetite - - - 1  Feeling bad or failure about yourself  - - - 2  Trouble concentrating - - - 0  Moving slowly or fidgety/restless - - - 1  Suicidal thoughts - - - 0  PHQ-9 Score - - - 12    Review of Systems  Constitutional: Negative.   HENT: Negative.   Eyes: Negative.   Respiratory: Positive for cough, shortness of  breath and wheezing.        Oxygen therapy Respiratory infections   Cardiovascular: Negative.   Gastrointestinal: Negative.   Endocrine: Negative.   Genitourinary: Positive for difficulty urinating and dysuria.  Musculoskeletal: Positive for arthralgias, back pain, gait problem, neck pain and neck stiffness.  Skin: Negative.   Allergic/Immunologic: Negative.   Hematological: Negative.   Psychiatric/Behavioral: Positive for dysphoric mood. The patient is nervous/anxious.   All other systems reviewed and are negative.     Objective:   Physical Exam  Gen: NAD. Vital signs reviewed HENT: Normocephalic, Atraumatic. Eyes: EOMI. No discharge.  Cardio: Regular rate and rhythm. No JVD. Pulm: +Wheezes. Effort normal. +Supplemental O2. Abd: Soft, BS+ MSK:  Gait mildly antaglic  TTP R>L lumbar paraspinal, gluteal muscles.  No edema.   Neg fortin finger Neuro: Sensation intact to light touch in all LE dermatomes  Strength  4/5 in all LLE myotomes     4-/5 in all RLE myotomes Skin: Warm and Dry. Intact.    Assessment & Plan:  52 y/o female with pmh of COPD on supplemental O2, HTN, coagulopathy, bipolar disorder, TIA who presents for follow up of b/l low back pain R>L.   1. Mechanical R>L low back pain  She states she does not have access and a phobia to pools  Cont Heat/Cold   Cont HEP, completed PT  Cont TENS  Cont Mobic 15mg  daily with food, educated pt not to take other NSAIDs concomitantly.   Will increase to Tizanidine 4mg  TID PRN.   Will consider bracing in future  2. COPD  Cont supplemental O2, 2L  Will be weary with benzo/TCA/narcotic administration  Encouraged to folllow up with PCP regarding cough  3. Sleep disturbance  Due to pain  Cont meds per Psych and Tizanidine  4. Myalgia  Will perform on next visit  5. Tobacco abuse  Educated on continuing to cut back, encouraged again currently 4/day  6. Bipolar disorder  Managed by Psych - pt on Gabapentin qHS, Xanax,  and lamictal  7. Abnormality of gait  Prescription for rollator provided  8. Syncope  Resolved

## 2017-12-11 ENCOUNTER — Ambulatory Visit: Payer: Medicare Other | Admitting: Physical Medicine & Rehabilitation

## 2017-12-11 ENCOUNTER — Encounter: Payer: Medicare Other | Admitting: Physical Medicine & Rehabilitation

## 2017-12-18 ENCOUNTER — Encounter: Payer: Medicare Other | Attending: Physical Medicine & Rehabilitation | Admitting: Physical Medicine & Rehabilitation

## 2017-12-18 ENCOUNTER — Encounter: Payer: Self-pay | Admitting: Physical Medicine & Rehabilitation

## 2017-12-18 VITALS — BP 90/63 | HR 82

## 2017-12-18 DIAGNOSIS — F17210 Nicotine dependence, cigarettes, uncomplicated: Secondary | ICD-10-CM | POA: Insufficient documentation

## 2017-12-18 DIAGNOSIS — G479 Sleep disorder, unspecified: Secondary | ICD-10-CM | POA: Diagnosis not present

## 2017-12-18 DIAGNOSIS — R269 Unspecified abnormalities of gait and mobility: Secondary | ICD-10-CM | POA: Insufficient documentation

## 2017-12-18 DIAGNOSIS — M545 Low back pain: Secondary | ICD-10-CM | POA: Diagnosis not present

## 2017-12-18 DIAGNOSIS — I10 Essential (primary) hypertension: Secondary | ICD-10-CM | POA: Diagnosis not present

## 2017-12-18 DIAGNOSIS — J449 Chronic obstructive pulmonary disease, unspecified: Secondary | ICD-10-CM | POA: Insufficient documentation

## 2017-12-18 DIAGNOSIS — F319 Bipolar disorder, unspecified: Secondary | ICD-10-CM | POA: Insufficient documentation

## 2017-12-18 DIAGNOSIS — Z9981 Dependence on supplemental oxygen: Secondary | ICD-10-CM | POA: Diagnosis not present

## 2017-12-18 DIAGNOSIS — M791 Myalgia, unspecified site: Secondary | ICD-10-CM

## 2017-12-18 NOTE — Progress Notes (Signed)
Trigger point injection procedure note: Trigger Point Injection: Written consent was obtained for the patient. Trigger points were identified of right thoracolumbosacral PSPs and bilateral gluteal muscles. The areas were cleaned with alcohol, and each of  these trigger points were sprayed with vapocoolant spray and then injected with 1 cc of 0.5% Marcaine (x8). Needle draw back was performed. There were no complications from the procedure, and it was well tolerated.

## 2018-01-15 ENCOUNTER — Encounter: Payer: Self-pay | Admitting: Physical Medicine & Rehabilitation

## 2018-01-15 ENCOUNTER — Encounter: Payer: Medicare Other | Attending: Physical Medicine & Rehabilitation | Admitting: Physical Medicine & Rehabilitation

## 2018-01-15 VITALS — BP 102/70 | HR 85 | Ht 68.0 in | Wt 225.0 lb

## 2018-01-15 DIAGNOSIS — R269 Unspecified abnormalities of gait and mobility: Secondary | ICD-10-CM | POA: Diagnosis not present

## 2018-01-15 DIAGNOSIS — Z9981 Dependence on supplemental oxygen: Secondary | ICD-10-CM | POA: Insufficient documentation

## 2018-01-15 DIAGNOSIS — I10 Essential (primary) hypertension: Secondary | ICD-10-CM | POA: Diagnosis not present

## 2018-01-15 DIAGNOSIS — J449 Chronic obstructive pulmonary disease, unspecified: Secondary | ICD-10-CM | POA: Diagnosis not present

## 2018-01-15 DIAGNOSIS — G8929 Other chronic pain: Secondary | ICD-10-CM

## 2018-01-15 DIAGNOSIS — F319 Bipolar disorder, unspecified: Secondary | ICD-10-CM | POA: Diagnosis not present

## 2018-01-15 DIAGNOSIS — M545 Low back pain, unspecified: Secondary | ICD-10-CM

## 2018-01-15 DIAGNOSIS — Z720 Tobacco use: Secondary | ICD-10-CM | POA: Diagnosis not present

## 2018-01-15 DIAGNOSIS — M791 Myalgia, unspecified site: Secondary | ICD-10-CM

## 2018-01-15 DIAGNOSIS — F17210 Nicotine dependence, cigarettes, uncomplicated: Secondary | ICD-10-CM | POA: Diagnosis not present

## 2018-01-15 DIAGNOSIS — G479 Sleep disorder, unspecified: Secondary | ICD-10-CM | POA: Insufficient documentation

## 2018-01-15 MED ORDER — MELOXICAM 15 MG PO TABS: ORAL_TABLET | ORAL | 1 refills | Status: DC

## 2018-01-15 NOTE — Progress Notes (Addendum)
Subjective:    Patient ID: Alicia Dixon, female    DOB: [birth year], 52 y.o.   MRN: [id number]  HPI  52 y/o female with pmh of COPD on supplemental O2, HTN, coagulopathy, bipolar disorderTIA who presents for follow up for b/l low back pain R>L.  Initially patient stated: Started ~10 years ago, getting progressively worse.  No alleviating factors.  Prolonged postures exacerbate the pain.  It is sharp and dull, constant.  Non-radiating. Severity is 10/10.  Denies associated numbness, tingling weakness. She has tried IBU, percocet, tylenol without benefit. She was seeing a previous pain management injections, she had trigger point injection, which helped.  She has had falls because of knee instability.  Pain limits activities of daily living.    Last clinic visit 12/18/17. She had trigger point injection at that time.  Since last visit, pt states she felt a pop and she fell.  She notes localized pain.  She notes benefit with increase Tizanidine. She is now smoking 2 cig/day. She obtained the rollator and doing well with it.    Pain Inventory Average Pain 9 Pain Right Now 10 My pain is constant, dull, stabbing and aching  In the last 24 hours, has pain interfered with the following? General activity 7 Relation with others 7 Enjoyment of life 7 What TIME of day is your pain at its worst? morning, daytime, evening, night Sleep (in general) Poor  Pain is worse with: walking, sitting and standing Pain improves with: rest, heat/ice, medication and TENS Relief from Meds: 4  Mobility walk without assistance how many minutes can you walk? 6-10 ability to climb steps?  yes do you drive?  yes transfers alone Do you have any goals in this area?  yes  Function Do you have any goals in this area?  no  Neuro/Psych spasms depression anxiety  Prior Studies Any changes since last visit?  no  Physicians involved in your care Any changes since last visit?  no   Family History  Problem  Relation Age of Onset  . Colon cancer Maternal Aunt    Social History   Socioeconomic History  . Marital status: Divorced    Spouse name: Not on file  . Number of children: Not on file  . Years of education: Not on file  . Highest education level: Not on file  Occupational History  . Not on file  Social Needs  . Financial resource strain: Not on file  . Food insecurity:    Worry: Not on file    Inability: Not on file  . Transportation needs:    Medical: Not on file    Non-medical: Not on file  Tobacco Use  . Smoking status: Current Every Day Smoker    Packs/day: 0.25    Years: 35.00    Pack years: 8.75    Types: Cigarettes  . Smokeless tobacco: Never Used  Substance and Sexual Activity  . Alcohol use: No  . Drug use: No  . Sexual activity: Yes    Birth control/protection: Surgical  Lifestyle  . Physical activity:    Days per week: Not on file    Minutes per session: Not on file  . Stress: Not on file  Relationships  . Social connections:    Talks on phone: Not on file    Gets together: Not on file    Attends religious service: Not on file    Active member of club or organization: Not on file    Attends  meetings of clubs or organizations: Not on file    Relationship status: Not on file  Other Topics Concern  . Not on file  Social History Narrative  . Not on file   Past Surgical History:  Procedure Laterality Date  . ABDOMINAL HYSTERECTOMY    . APPENDECTOMY    . CESAREAN SECTION    . CHOLECYSTECTOMY    . LUNG LOBECTOMY     right lung    Past Medical History:  Diagnosis Date  . Asthma   . Bipolar disorder (HCC)   . COPD (chronic obstructive pulmonary disease) (HCC)   . Depression   . Stroke (HCC)    BP 102/70   Pulse 85   Ht 5\' 8"  (1.727 m) Comment: states  Wt 225 lb (102.1 kg)   SpO2 92% Comment: o2 2ltrs via nasal can  BMI 34.21 kg/m   Opioid Risk Score:   Fall Risk Score:  `1  Depression screen PHQ 2/9  Depression screen [facility] 2/9  08/22/2017 01/11/2017 06/07/2016 05/25/2016  Decreased Interest 0 1 1 2   Down, Depressed, Hopeless 0 1 1 0  PHQ - 2 Score 0 2 2 2   Altered sleeping - - - 3  Tired, decreased energy - - - 3  Change in appetite - - - 1  Feeling bad or failure about yourself  - - - 2  Trouble concentrating - - - 0  Moving slowly or fidgety/restless - - - 1  Suicidal thoughts - - - 0  PHQ-9 Score - - - 12    Review of Systems  Constitutional: Negative.   HENT: Negative.   Eyes: Negative.   Respiratory: Positive for shortness of breath and wheezing.        Oxygen therapy Respiratory infections   Cardiovascular: Negative.   Gastrointestinal: Negative.   Endocrine: Negative.   Genitourinary: Negative.   Musculoskeletal: Positive for arthralgias, back pain, gait problem and myalgias.  Skin: Negative.   Allergic/Immunologic: Negative.   Hematological: Negative.   Psychiatric/Behavioral: Positive for dysphoric mood. The patient is nervous/anxious.   All other systems reviewed and are negative.     Objective:   Physical Exam  Gen: NAD. Vital signs reviewed HENT: Normocephalic, Atraumatic. Eyes: EOMI. No discharge.  Cardio: RRR. No JVD. Pulm: +Wheezes. Effort normal. +Supplemental O2. Abd: Nondistended, BS+ MSK:  Gait mildly antaglic  TTP right gluteal muscles (localized).  No edema.  Neuro:  Strength  4/5 in all LLE myotomes     4-/5 in all RLE myotomes (pain inhibition) Skin: Warm and Dry. Intact.    Assessment & Plan:  53 y/o female with pmh of COPD on supplemental O2, HTN, coagulopathy, bipolar disorder, TIA who presents for follow up of b/l low back pain R>L.   1. Mechanical R>L low back pain  She states she does not have access and a phobia to pools  Cont Heat/Cold   Cont HEP, completed PT  Cont TENS  Cont Mobic 15mg  daily with food, educated pt not to take other NSAIDs concomitantly.   Cont Tizanidine 4mg  TID PRN.   Will consider bracing in future  2. COPD  Cont supplemental O2,  2L  Will be weary with benzo/TCA/narcotic administration  Encouraged to folllow up with PCP regarding cough  3. Sleep disturbance  Due to pain  Cont meds per Psych and Tizanidine  4. Myalgia  Will perform on next visit  5. Tobacco abuse  Educated on continuing to cut back, encouraged again currently 2/day, plan to d/c  by next visit  6. Bipolar disorder  Managed by Psych - pt on Gabapentin qHS, Xanax, and lamictal  7. Abnormality of gait  Cont rollator   8. Syncope  Resolved

## 2018-02-13 ENCOUNTER — Encounter: Payer: Medicare Other | Admitting: Physical Medicine & Rehabilitation

## 2018-02-24 ENCOUNTER — Other Ambulatory Visit: Payer: Self-pay | Admitting: Physical Medicine & Rehabilitation

## 2018-02-26 ENCOUNTER — Other Ambulatory Visit (HOSPITAL_COMMUNITY): Payer: Self-pay | Admitting: Internal Medicine

## 2018-02-26 DIAGNOSIS — Z1231 Encounter for screening mammogram for malignant neoplasm of breast: Secondary | ICD-10-CM

## 2018-03-13 ENCOUNTER — Encounter: Payer: Medicare Other | Attending: Physical Medicine & Rehabilitation | Admitting: Physical Medicine & Rehabilitation

## 2018-03-13 ENCOUNTER — Encounter: Payer: Self-pay | Admitting: Physical Medicine & Rehabilitation

## 2018-03-13 VITALS — BP 126/87 | HR 91 | Ht 68.0 in | Wt 227.0 lb

## 2018-03-13 DIAGNOSIS — F17210 Nicotine dependence, cigarettes, uncomplicated: Secondary | ICD-10-CM | POA: Diagnosis not present

## 2018-03-13 DIAGNOSIS — J449 Chronic obstructive pulmonary disease, unspecified: Secondary | ICD-10-CM | POA: Insufficient documentation

## 2018-03-13 DIAGNOSIS — Z9981 Dependence on supplemental oxygen: Secondary | ICD-10-CM | POA: Insufficient documentation

## 2018-03-13 DIAGNOSIS — I10 Essential (primary) hypertension: Secondary | ICD-10-CM | POA: Insufficient documentation

## 2018-03-13 DIAGNOSIS — F319 Bipolar disorder, unspecified: Secondary | ICD-10-CM | POA: Insufficient documentation

## 2018-03-13 DIAGNOSIS — G479 Sleep disorder, unspecified: Secondary | ICD-10-CM | POA: Diagnosis not present

## 2018-03-13 DIAGNOSIS — R269 Unspecified abnormalities of gait and mobility: Secondary | ICD-10-CM | POA: Insufficient documentation

## 2018-03-13 DIAGNOSIS — M791 Myalgia, unspecified site: Secondary | ICD-10-CM

## 2018-03-13 DIAGNOSIS — M545 Low back pain: Secondary | ICD-10-CM | POA: Insufficient documentation

## 2018-03-13 NOTE — Progress Notes (Signed)
Trigger point injection procedure note: Trigger Point Injection: Written consent was obtained for the patient. Trigger points were identified of right thoracolumbosacral and coccyx PSPs and bilateral gluteal muscles. The areas were cleaned with alcohol, and each of  these trigger points were sprayed with vapocoolant spray and then injected with 1 cc of 0.5% Marcaine (x5). Needle draw back was performed. There were no complications from the procedure, and it was well tolerated.

## 2018-03-19 ENCOUNTER — Ambulatory Visit (HOSPITAL_COMMUNITY): Payer: Medicare Other

## 2018-03-19 ENCOUNTER — Other Ambulatory Visit (HOSPITAL_COMMUNITY): Payer: Self-pay | Admitting: Internal Medicine

## 2018-03-19 ENCOUNTER — Encounter (HOSPITAL_COMMUNITY): Payer: Self-pay

## 2018-03-19 ENCOUNTER — Ambulatory Visit (HOSPITAL_COMMUNITY)
Admission: RE | Admit: 2018-03-19 | Discharge: 2018-03-19 | Disposition: A | Discharge status: Home / Self Care | Payer: Medicare Other | Source: Ambulatory Visit | Attending: Internal Medicine | Admitting: Internal Medicine

## 2018-03-19 DIAGNOSIS — Z1231 Encounter for screening mammogram for malignant neoplasm of breast: Secondary | ICD-10-CM

## 2018-03-30 ENCOUNTER — Other Ambulatory Visit: Payer: Self-pay | Admitting: Physical Medicine & Rehabilitation

## 2018-03-31 NOTE — Telephone Encounter (Signed)
Recieved electronic medication refill for tizanidine, in addition patient has requested a 90 day supply of said medication.  Would this be ok to refill in that quantity?   Please advise.

## 2018-03-31 NOTE — Telephone Encounter (Signed)
Yes, that is fine. Thanks. 

## 2018-04-17 ENCOUNTER — Encounter: Payer: Medicare Other | Attending: Physical Medicine & Rehabilitation | Admitting: Physical Medicine & Rehabilitation

## 2018-04-17 ENCOUNTER — Encounter: Payer: Self-pay | Admitting: Physical Medicine & Rehabilitation

## 2018-04-17 VITALS — BP 92/67 | HR 87 | Resp 14 | Ht 68.0 in | Wt 231.0 lb

## 2018-04-17 DIAGNOSIS — Z716 Tobacco abuse counseling: Secondary | ICD-10-CM

## 2018-04-17 DIAGNOSIS — I10 Essential (primary) hypertension: Secondary | ICD-10-CM | POA: Insufficient documentation

## 2018-04-17 DIAGNOSIS — J449 Chronic obstructive pulmonary disease, unspecified: Secondary | ICD-10-CM | POA: Diagnosis not present

## 2018-04-17 DIAGNOSIS — Z720 Tobacco use: Secondary | ICD-10-CM

## 2018-04-17 DIAGNOSIS — M545 Low back pain: Secondary | ICD-10-CM | POA: Insufficient documentation

## 2018-04-17 DIAGNOSIS — M791 Myalgia, unspecified site: Secondary | ICD-10-CM

## 2018-04-17 DIAGNOSIS — G479 Sleep disorder, unspecified: Secondary | ICD-10-CM | POA: Insufficient documentation

## 2018-04-17 DIAGNOSIS — G8929 Other chronic pain: Secondary | ICD-10-CM | POA: Diagnosis not present

## 2018-04-17 DIAGNOSIS — Z9981 Dependence on supplemental oxygen: Secondary | ICD-10-CM | POA: Diagnosis not present

## 2018-04-17 DIAGNOSIS — F319 Bipolar disorder, unspecified: Secondary | ICD-10-CM | POA: Insufficient documentation

## 2018-04-17 DIAGNOSIS — F17210 Nicotine dependence, cigarettes, uncomplicated: Secondary | ICD-10-CM | POA: Insufficient documentation

## 2018-04-17 DIAGNOSIS — R269 Unspecified abnormalities of gait and mobility: Secondary | ICD-10-CM | POA: Diagnosis not present

## 2018-04-17 NOTE — Progress Notes (Addendum)
Subjective:    Patient ID: Alicia Dixon, female    DOB: 03-29-1966, 52 y.o.   MRN: [id number]  HPI  52 y/o female with pmh of COPD on supplemental O2, HTN, coagulopathy, bipolar disorderTIA who presents for follow up for b/l low back pain R>L.  Initially patient stated: Started ~10 years ago, getting progressively worse.  No alleviating factors.  Prolonged postures exacerbate the pain.  It is sharp and dull, constant.  Non-radiating. Severity is 10/10.  Denies associated numbness, tingling weakness. She has tried IBU, percocet, tylenol without benefit. She was seeing a previous pain management injections, she had trigger point injection, which helped.  She has had falls because of knee instability.  Pain limits activities of daily living.    Last clinic visit 03/13/18. She had trigger point injection at that time with benefit.  Today, she was to get trigger point injections, but states she has not felt well since this morning, with increased SOB and malaise. Denies falls.   Pain Inventory Average Pain 9 Pain Right Now 10 My pain is constant, dull, stabbing and aching  In the last 24 hours, has pain interfered with the following? General activity 7 Relation with others 7 Enjoyment of life 7 What TIME of day is your pain at its worst? morning, daytime, evening, night Sleep (in general) Poor  Pain is worse with: walking, sitting and standing Pain improves with: rest, heat/ice, medication and TENS Relief from Meds: 4  Mobility walk without assistance how many minutes can you walk? 6-10 ability to climb steps?  yes do you drive?  yes transfers alone Do you have any goals in this area?  yes  Function Do you have any goals in this area?  no  Neuro/Psych spasms depression anxiety  Prior Studies Any changes since last visit?  no  Physicians involved in your care Any changes since last visit?  no   Family History  Problem Relation Age of Onset  . Colon cancer Maternal  Aunt    Social History   Socioeconomic History  . Marital status: Divorced    Spouse name: Not on file  . Number of children: Not on file  . Years of education: Not on file  . Highest education level: Not on file  Occupational History  . Not on file  Social Needs  . Financial resource strain: Not on file  . Food insecurity:    Worry: Not on file    Inability: Not on file  . Transportation needs:    Medical: Not on file    Non-medical: Not on file  Tobacco Use  . Smoking status: Current Every Day Smoker    Packs/day: 0.25    Years: 35.00    Pack years: 8.75    Types: Cigarettes  . Smokeless tobacco: Never Used  Substance and Sexual Activity  . Alcohol use: No  . Drug use: No  . Sexual activity: Yes    Birth control/protection: Surgical  Lifestyle  . Physical activity:    Days per week: Not on file    Minutes per session: Not on file  . Stress: Not on file  Relationships  . Social connections:    Talks on phone: Not on file    Gets together: Not on file    Attends religious service: Not on file    Active member of club or organization: Not on file    Attends meetings of clubs or organizations: Not on file    Relationship status:  Not on file  Other Topics Concern  . Not on file  Social History Narrative  . Not on file   Past Surgical History:  Procedure Laterality Date  . ABDOMINAL HYSTERECTOMY    . APPENDECTOMY    . CESAREAN SECTION    . CHOLECYSTECTOMY    . LUNG LOBECTOMY     right lung    Past Medical History:  Diagnosis Date  . Asthma   . Bipolar disorder (HCC)   . COPD (chronic obstructive pulmonary disease) (HCC)   . Depression   . Stroke (HCC)    BP 92/67   Pulse 87   Resp 14   Ht 5\' 8"  (1.727 m)   Wt 231 lb (104.8 kg)   SpO2 92%   BMI 35.12 kg/m   Opioid Risk Score:   Fall Risk Score:  `1  Depression screen PHQ 2/9  [facility] 2/9 08/22/2017 01/11/2017 06/07/2016 05/25/2016  Decreased Interest 0 1 1 2   Down, Depressed,  Hopeless 0 1 1 0  PHQ - 2 Score 0 2 2 2   Altered sleeping - - - 3  Tired, decreased energy - - - 3  Change in appetite - - - 1  Feeling bad or failure about yourself  - - - 2  Trouble concentrating - - - 0  Moving slowly or fidgety/restless - - - 1  Suicidal thoughts - - - 0  PHQ-9 Score - - - 12    Review of Systems  Constitutional: Negative.   HENT: Negative.   Eyes: Negative.   Respiratory: Positive for shortness of breath and wheezing.        Oxygen therapy Respiratory infections   Cardiovascular: Negative.   Gastrointestinal: Negative.   Endocrine: Negative.   Genitourinary: Negative.   Musculoskeletal: Positive for arthralgias, back pain, gait problem and myalgias.  Skin: Negative.   Allergic/Immunologic: Negative.   Hematological: Negative.   Psychiatric/Behavioral: Positive for dysphoric mood. The patient is nervous/anxious.   All other systems reviewed and are negative.     Objective:   Physical Exam  Gen: +Distressed. Vital signs reviewed HENT: Normocephalic, Atraumatic. Eyes: EOMI. No discharge.  Cardio: RRR. No JVD. Pulm: +Wheezes.  Dyspnic. +Supplemental O2. Abd: Nondistended, BS+ MSK:  Gait antalgic  TTP right gluteal muscles (localized).  No edema.  Neuro:  Strength  4/5 in all LLE myotomes     4-/5 in all RLE myotomes (pain inhibition) Skin: Warm and Dry. Intact.    Assessment & Plan:  52 y/o female with pmh of COPD on supplemental O2, HTN, coagulopathy, bipolar disorder, TIA who presents for follow up of b/l low back pain R>L.   1. Mechanical R>L low back pain  She states she does not have access and a phobia to pools  Cont Heat/Cold   Cont HEP, completed PT  Cont TENS  Cont Mobic 15mg  daily with food, educated pt not to take other NSAIDs concomitantly.   Cont Tizanidine 4mg  TID PRN.   2. COPD  Cont supplemental O2, 2L  Will be weary with benzo/TCA/narcotic administration  Encouraged to folllow up with PCP regarding increasing SOB  3.  Sleep disturbance  Due to pain  Cont meds per Psych and Tizanidine  4. Myalgia  Will hold off due to increase SOB and malaise with dyspnea, will reschedule   5. Tobacco abuse  Educated on continuing to cut back, encouraged again currently 2/day, plan to d/c by next visit  6. Bipolar disorder  Managed by Psych -  pt on Gabapentin qHS, Xanax, and lamictal  7. Abnormality of gait  Cont rollator   8. Syncope  Resolved

## 2018-05-02 ENCOUNTER — Telehealth: Payer: Self-pay | Admitting: Physical Medicine & Rehabilitation

## 2018-05-02 NOTE — Telephone Encounter (Signed)
New Message  Pt verbalized her back is hurting and she would like rx for back pain if at all possible.

## 2018-05-02 NOTE — Telephone Encounter (Signed)
Patient missed her last scheduled appointment for trigger point injection due to increased shortness of breath and dyspnea. We will schedule her to be seen for trigger point injections again. In he meantime, she can continue taking her tizanidine if she is not already.

## 2018-05-05 ENCOUNTER — Telehealth: Payer: Self-pay | Admitting: Physical Medicine & Rehabilitation

## 2018-05-05 NOTE — Telephone Encounter (Signed)
She may increased Tizanidine to 8mg  TID PRN until Friday.  Thanks.

## 2018-05-05 NOTE — Telephone Encounter (Signed)
Follow up  I spoke with pt and she verbalized the tizanidine is not working and needs an alternative.  Pt is aware of appt on 05/09/2018.  Please f/u

## 2018-05-06 NOTE — Telephone Encounter (Signed)
Called pt back, had to leave message on pt voicemail to call back.

## 2018-05-09 ENCOUNTER — Encounter: Payer: Medicare Other | Attending: Physical Medicine & Rehabilitation | Admitting: Physical Medicine & Rehabilitation

## 2018-05-09 ENCOUNTER — Encounter: Payer: Self-pay | Admitting: Physical Medicine & Rehabilitation

## 2018-05-09 VITALS — BP 110/75 | HR 100 | Ht 68.0 in | Wt 237.0 lb

## 2018-05-09 DIAGNOSIS — F17210 Nicotine dependence, cigarettes, uncomplicated: Secondary | ICD-10-CM | POA: Diagnosis not present

## 2018-05-09 DIAGNOSIS — Z9981 Dependence on supplemental oxygen: Secondary | ICD-10-CM | POA: Insufficient documentation

## 2018-05-09 DIAGNOSIS — M791 Myalgia, unspecified site: Secondary | ICD-10-CM | POA: Diagnosis not present

## 2018-05-09 DIAGNOSIS — I10 Essential (primary) hypertension: Secondary | ICD-10-CM | POA: Insufficient documentation

## 2018-05-09 DIAGNOSIS — F319 Bipolar disorder, unspecified: Secondary | ICD-10-CM | POA: Insufficient documentation

## 2018-05-09 DIAGNOSIS — J449 Chronic obstructive pulmonary disease, unspecified: Secondary | ICD-10-CM | POA: Insufficient documentation

## 2018-05-09 DIAGNOSIS — M545 Low back pain: Secondary | ICD-10-CM | POA: Insufficient documentation

## 2018-05-09 DIAGNOSIS — R269 Unspecified abnormalities of gait and mobility: Secondary | ICD-10-CM | POA: Diagnosis not present

## 2018-05-09 DIAGNOSIS — G479 Sleep disorder, unspecified: Secondary | ICD-10-CM | POA: Insufficient documentation

## 2018-05-09 MED ORDER — KETOROLAC TROMETHAMINE 30 MG/ML IJ SOLN: 30.0000 mg | Freq: Once | INTRAMUSCULAR | Status: DC

## 2018-05-09 MED ORDER — KETOROLAC TROMETHAMINE 30 MG/ML IJ SOLN
30.0000 mg | Freq: Once | INTRAMUSCULAR | Status: AC
  Administered 2018-05-09: 30 mg via INTRAMUSCULAR

## 2018-05-09 NOTE — Progress Notes (Deleted)
Subjective:    Patient ID: Alicia Dixon, female    DOB: Oct 16, 1965, 52 y.o.   MRN: [id number]  HPI  52 y/o female with pmh of COPD on supplemental O2, HTN, coagulopathy, bipolar disorderTIA who presents for follow up for b/l low back pain R>L.  Initially patient stated: Started ~10 years ago, getting progressively worse.  No alleviating factors.  Prolonged postures exacerbate the pain.  It is sharp and dull, constant.  Non-radiating. Severity is 10/10.  Denies associated numbness, tingling weakness. She has tried IBU, percocet, tylenol without benefit. She was seeing a previous pain management injections, she had trigger point injection, which helped.  She has had falls because of knee instability.  Pain limits activities of daily living.    Last clinic visit 12/18/17. She had trigger point injection at that time.  Since last visit, pt states she felt a pop and she fell.  She notes localized pain.  She notes benefit with increase Tizanidine. She is now smoking 2 cig/day. She obtained the rollator and doing well with it.    Pain Inventory Average Pain 10 Pain Right Now 10 My pain is constant, sharp, dull, stabbing and aching  In the last 24 hours, has pain interfered with the following? General activity 1 Relation with others 2 Enjoyment of life 0 What TIME of day is your pain at its worst? morning, daytime, evening, night Sleep (in general) Poor  Pain is worse with: walking, bending, sitting and standing Pain improves with: nothing Relief from Meds: 0  Mobility walk with assistance use a walker transfers alone Do you have any goals in this area?  yes  Function disabled: date disabled . Do you have any goals in this area?  no  Neuro/Psych depression anxiety  Prior Studies Any changes since last visit?  no  Physicians involved in your care Any changes since last visit?  no   Family History  Problem Relation Age of Onset  . Colon cancer Maternal Aunt    Social  History   Socioeconomic History  . Marital status: Divorced    Spouse name: Not on file  . Number of children: Not on file  . Years of education: Not on file  . Highest education level: Not on file  Occupational History  . Not on file  Social Needs  . Financial resource strain: Not on file  . Food insecurity:    Worry: Not on file    Inability: Not on file  . Transportation needs:    Medical: Not on file    Non-medical: Not on file  Tobacco Use  . Smoking status: Current Every Day Smoker    Packs/day: 0.25    Years: 35.00    Pack years: 8.75    Types: Cigarettes  . Smokeless tobacco: Never Used  Substance and Sexual Activity  . Alcohol use: No  . Drug use: No  . Sexual activity: Yes    Birth control/protection: Surgical  Lifestyle  . Physical activity:    Days per week: Not on file    Minutes per session: Not on file  . Stress: Not on file  Relationships  . Social connections:    Talks on phone: Not on file    Gets together: Not on file    Attends religious service: Not on file    Active member of club or organization: Not on file    Attends meetings of clubs or organizations: Not on file    Relationship status: Not  on file  Other Topics Concern  . Not on file  Social History Narrative  . Not on file   Past Surgical History:  Procedure Laterality Date  . ABDOMINAL HYSTERECTOMY    . APPENDECTOMY    . CESAREAN SECTION    . CHOLECYSTECTOMY    . LUNG LOBECTOMY     right lung    Past Medical History:  Diagnosis Date  . Asthma   . Bipolar disorder (HCC)   . COPD (chronic obstructive pulmonary disease) (HCC)   . Depression   . Stroke (HCC)    BP 110/75 (BP Location: Left Arm, Patient Position: Sitting, Cuff Size: Large)   Pulse 100   Ht 5\' 8"  (1.727 m)   Wt 237 lb (107.5 kg)   SpO2 90%   BMI 36.04 kg/m   Opioid Risk Score:   Fall Risk Score:  `1  Depression screen PHQ 2/9  Depression screen [facility] 2/9 08/22/2017 01/11/2017 06/07/2016 05/25/2016    Decreased Interest 0 1 1 2   Down, Depressed, Hopeless 0 1 1 0  PHQ - 2 Score 0 2 2 2   Altered sleeping - - - 3  Tired, decreased energy - - - 3  Change in appetite - - - 1  Feeling bad or failure about yourself  - - - 2  Trouble concentrating - - - 0  Moving slowly or fidgety/restless - - - 1  Suicidal thoughts - - - 0  PHQ-9 Score - - - 12    Review of Systems  Constitutional: Negative.   HENT: Negative.   Eyes: Negative.   Respiratory: Positive for cough, shortness of breath and wheezing.        Oxygen therapy Respiratory infections   Cardiovascular: Negative.   Gastrointestinal: Negative.   Endocrine: Negative.   Genitourinary: Negative.   Musculoskeletal: Positive for arthralgias, back pain, gait problem and myalgias.  Skin: Negative.   Allergic/Immunologic: Negative.   Hematological: Negative.   Psychiatric/Behavioral: Positive for dysphoric mood. The patient is nervous/anxious.   All other systems reviewed and are negative.     Objective:   Physical Exam  Gen: NAD. Vital signs reviewed HENT: Normocephalic, Atraumatic. Eyes: EOMI. No discharge.  Cardio: RRR. No JVD. Pulm: +Wheezes. Effort normal. +Supplemental O2. Abd: Nondistended, BS+ MSK:  Gait mildly antaglic  TTP right gluteal muscles (localized).  No edema.  Neuro:  Strength  4/5 in all LLE myotomes     4-/5 in all RLE myotomes (pain inhibition) Skin: Warm and Dry. Intact.    Assessment & Plan:  52 y/o female with pmh of COPD on supplemental O2, HTN, coagulopathy, bipolar disorder, TIA who presents for follow up of b/l low back pain R>L.   1. Mechanical R>L low back pain  She states she does not have access and a phobia to pools  Cont Heat/Cold   Cont HEP, completed PT  Cont TENS  Cont Mobic 15mg  daily with food, educated pt not to take other NSAIDs concomitantly.   Cont Tizanidine 4mg  TID PRN.   Will consider bracing in future  2. COPD  Cont supplemental O2, 2L  Will be weary with  benzo/TCA/narcotic administration  Encouraged to folllow up with PCP regarding cough  3. Sleep disturbance  Due to pain  Cont meds per Psych and Tizanidine  4. Myalgia  Will perform on next visit  5. Tobacco abuse  Educated on continuing to cut back, encouraged again currently 2/day, plan to d/c by next visit  6. Bipolar disorder  Managed by Psych - pt on Gabapentin qHS, Xanax, and lamictal  7. Abnormality of gait  Cont rollator   8. Syncope  Resolved

## 2018-05-09 NOTE — Progress Notes (Signed)
Trigger point injection procedure note: Trigger Point Injection: Written consent was obtained for the patient. Trigger points were identified of bilateral thoracolumbosacral and coccyx PSPs and bilateral gluteal muscles. The areas were cleaned with alcohol, and each of  these trigger points were sprayed with vapocoolant spray. Bilateral thoracic PSP were injected, with patient inability to tolerate injections due to pain and request for injection of coccyx only. 1 cc of 0.5% Marcaine was injected (x3). Needle draw back was performed. There were no complications from the procedure.   Patient noted to have increased WOB of recommended follow up with Pulm, with whom she states she has an appointment with. Toradol injection provided due to generalized pain.

## 2018-05-10 ENCOUNTER — Other Ambulatory Visit: Payer: Self-pay | Admitting: Physical Medicine & Rehabilitation

## 2018-05-13 ENCOUNTER — Other Ambulatory Visit (HOSPITAL_COMMUNITY): Payer: Self-pay | Admitting: Pulmonary Disease

## 2018-05-13 DIAGNOSIS — R609 Edema, unspecified: Secondary | ICD-10-CM

## 2018-05-15 ENCOUNTER — Other Ambulatory Visit (HOSPITAL_COMMUNITY): Payer: Medicare Other

## 2018-05-15 ENCOUNTER — Encounter: Payer: Medicare Other | Admitting: Physical Medicine & Rehabilitation

## 2018-05-16 ENCOUNTER — Ambulatory Visit (HOSPITAL_COMMUNITY)
Admission: RE | Admit: 2018-05-16 | Discharge: 2018-05-16 | Disposition: A | Discharge status: Home / Self Care | Payer: Medicare Other | Source: Ambulatory Visit | Attending: Pulmonary Disease | Admitting: Pulmonary Disease

## 2018-05-16 DIAGNOSIS — Z9981 Dependence on supplemental oxygen: Secondary | ICD-10-CM | POA: Diagnosis not present

## 2018-05-16 DIAGNOSIS — J9621 Acute and chronic respiratory failure with hypoxia: Secondary | ICD-10-CM | POA: Diagnosis not present

## 2018-05-16 DIAGNOSIS — I10 Essential (primary) hypertension: Secondary | ICD-10-CM | POA: Diagnosis not present

## 2018-05-16 DIAGNOSIS — E785 Hyperlipidemia, unspecified: Secondary | ICD-10-CM | POA: Insufficient documentation

## 2018-05-16 DIAGNOSIS — R609 Edema, unspecified: Secondary | ICD-10-CM | POA: Insufficient documentation

## 2018-05-16 DIAGNOSIS — J449 Chronic obstructive pulmonary disease, unspecified: Secondary | ICD-10-CM | POA: Insufficient documentation

## 2018-05-16 DIAGNOSIS — K219 Gastro-esophageal reflux disease without esophagitis: Secondary | ICD-10-CM | POA: Insufficient documentation

## 2018-05-16 NOTE — Progress Notes (Signed)
*  PRELIMINARY RESULTS* Echocardiogram 2D Echocardiogram has been performed.  Stacey DrainWhite, Averee Harb J 05/16/2018, 1:43 PM

## 2018-05-22 ENCOUNTER — Other Ambulatory Visit (HOSPITAL_COMMUNITY): Payer: Self-pay | Admitting: Internal Medicine

## 2018-05-22 DIAGNOSIS — N63 Unspecified lump in unspecified breast: Secondary | ICD-10-CM

## 2018-05-28 ENCOUNTER — Encounter: Payer: Self-pay | Admitting: Physical Medicine & Rehabilitation

## 2018-05-28 ENCOUNTER — Encounter (HOSPITAL_BASED_OUTPATIENT_CLINIC_OR_DEPARTMENT_OTHER): Payer: Medicare Other | Admitting: Physical Medicine & Rehabilitation

## 2018-05-28 VITALS — BP 93/68 | HR 95 | Ht 68.0 in | Wt 245.0 lb

## 2018-05-28 DIAGNOSIS — J449 Chronic obstructive pulmonary disease, unspecified: Secondary | ICD-10-CM | POA: Diagnosis not present

## 2018-05-28 DIAGNOSIS — G8929 Other chronic pain: Secondary | ICD-10-CM

## 2018-05-28 DIAGNOSIS — G894 Chronic pain syndrome: Secondary | ICD-10-CM

## 2018-05-28 DIAGNOSIS — R269 Unspecified abnormalities of gait and mobility: Secondary | ICD-10-CM

## 2018-05-28 DIAGNOSIS — M545 Low back pain, unspecified: Secondary | ICD-10-CM | POA: Insufficient documentation

## 2018-05-28 DIAGNOSIS — W19XXXA Unspecified fall, initial encounter: Secondary | ICD-10-CM

## 2018-05-28 DIAGNOSIS — G479 Sleep disorder, unspecified: Secondary | ICD-10-CM

## 2018-05-28 NOTE — Progress Notes (Signed)
Subjective:    Patient ID: Alicia Dixon, female    DOB: Sep 01, 1966, 52 y.o.   MRN: [id number]  HPI  52 y/o female with pmh of COPD on supplemental O2, HTN, coagulopathy, bipolar disorderTIA who presents for follow up for b/l low back pain R>L.  Initially patient stated: Started ~10 years ago, getting progressively worse.  No alleviating factors.  Prolonged postures exacerbate the pain.  It is sharp and dull, constant.  Non-radiating. Severity is 10/10.  Denies associated numbness, tingling weakness. She has tried IBU, percocet, tylenol without benefit. She was seeing a previous pain management injections, she had trigger point injection, which helped.  She has had falls because of knee instability.  Pain limits activities of daily living.    Last clinic visit 05/09/18. She had trigger point injection at that time.  Since last visit, pt states she is still going through adjustment of medications for increased WOB. She is taking Zanflex, which helps at times.  She has stopped smoking. She had a fall trying to get up to go the bathroom.     Pain Inventory Average Pain 9 Pain Right Now 9 My pain is constant, sharp, dull and aching  In the last 24 hours, has pain interfered with the following? General activity 6 Relation with others 9 Enjoyment of life 10 What TIME of day is your pain at its worst? morning, daytime, evening, night Sleep (in general) Poor  Pain is worse with: walking, sitting, standing and unsure Pain improves with: rest, heat/ice, therapy/exercise, medication, TENS and injections Relief from Meds: 2  Mobility walk with assistance use a walker how many minutes can you walk? 10 ability to climb steps?  yes do you drive?  yes transfers alone Do you have any goals in this area?  yes  Function Do you have any goals in this area?  no  Neuro/Psych tremor tingling trouble walking spasms dizziness depression anxiety  Prior Studies Any changes since last visit?   no  Physicians involved in your care Any changes since last visit?  no   Family History  Problem Relation Age of Onset  . Colon cancer Maternal Aunt    Social History   Socioeconomic History  . Marital status: Divorced    Spouse name: Not on file  . Number of children: Not on file  . Years of education: Not on file  . Highest education level: Not on file  Occupational History  . Not on file  Social Needs  . Financial resource strain: Not on file  . Food insecurity:    Worry: Not on file    Inability: Not on file  . Transportation needs:    Medical: Not on file    Non-medical: Not on file  Tobacco Use  . Smoking status: Current Every Day Smoker    Packs/day: 0.25    Years: 35.00    Pack years: 8.75    Types: Cigarettes  . Smokeless tobacco: Never Used  Substance and Sexual Activity  . Alcohol use: No  . Drug use: No  . Sexual activity: Yes    Birth control/protection: Surgical  Lifestyle  . Physical activity:    Days per week: Not on file    Minutes per session: Not on file  . Stress: Not on file  Relationships  . Social connections:    Talks on phone: Not on file    Gets together: Not on file    Attends religious service: Not on file  Active member of club or organization: Not on file    Attends meetings of clubs or organizations: Not on file    Relationship status: Not on file  Other Topics Concern  . Not on file  Social History Narrative  . Not on file   Past Surgical History:  Procedure Laterality Date  . ABDOMINAL HYSTERECTOMY    . APPENDECTOMY    . CESAREAN SECTION    . CHOLECYSTECTOMY    . LUNG LOBECTOMY     right lung    Past Medical History:  Diagnosis Date  . Asthma   . Bipolar disorder (HCC)   . COPD (chronic obstructive pulmonary disease) (HCC)   . Depression   . Stroke (HCC)    BP 93/68 (BP Location: Left Arm, Patient Position: Sitting, Cuff Size: Large)   Pulse 95   Ht 5\' 8"  (1.727 m)   Wt 245 lb (111.1 kg)   SpO2 90%    BMI 37.25 kg/m   Opioid Risk Score:   Fall Risk Score:  `1  Depression screen PHQ 2/9  Depression screen [facility] 2/9 08/22/2017 01/11/2017 06/07/2016 05/25/2016  Decreased Interest 0 1 1 2   Down, Depressed, Hopeless 0 1 1 0  PHQ - 2 Score 0 2 2 2   Altered sleeping - - - 3  Tired, decreased energy - - - 3  Change in appetite - - - 1  Feeling bad or failure about yourself  - - - 2  Trouble concentrating - - - 0  Moving slowly or fidgety/restless - - - 1  Suicidal thoughts - - - 0  PHQ-9 Score - - - 12    Review of Systems  Constitutional: Negative.   HENT: Negative.   Eyes: Negative.   Respiratory: Positive for cough, shortness of breath and wheezing.        Oxygen therapy Respiratory infections   Cardiovascular: Negative.   Gastrointestinal: Negative.   Endocrine: Negative.   Genitourinary: Negative.   Musculoskeletal: Positive for arthralgias, back pain, gait problem and myalgias.       Spasms  Skin: Negative.   Allergic/Immunologic: Negative.   Neurological: Positive for dizziness and tremors.       Tingling  Hematological: Negative.   Psychiatric/Behavioral: Positive for dysphoric mood. The patient is nervous/anxious.   All other systems reviewed and are negative.     Objective:   Physical Exam  Gen: NAD. Vital signs reviewed HENT: Normocephalic, Atraumatic. Eyes: EOMI. No discharge.  Cardio: RRR. No JVD. Pulm: +Ronchi. Increased WOB. +Supplemental 02. Abd: Nondistended, BS+ MSK:  Gait mildly antalgic  +TTP right gluteal muscles.  No edema.  Neuro:  Strength  4/5 in all LLE myotomes     4-/5 in all RLE myotomes (pain inhibition) Skin: Warm and Dry. Intact.    Assessment & Plan:  52 y/o female with pmh of COPD on supplemental O2, HTN, coagulopathy, bipolar disorder, TIA who presents for follow up of b/l low back pain R>L.   1. Mechanical R>L low back pain  She states she does not have access and a phobia to pools  Cont Heat/Cold   Cont HEP, completed  PT  Cont TENS  Cont Mobic 15mg  daily with food, educated pt not to take other NSAIDs concomitantly.   Labs ordered  Cont Tizanidine 4mg  TID PRN.   2. COPD  Cont supplemental O2, 2L  Will be weary with benzo/TCA/narcotic administration  Encouraged follow up for adjustment of medications  3. Sleep disturbance  Due to pain  Cont meds per Psych and Tizanidine  Encouraged follow up for sleep study, pt refusing at present  4. Myalgia  Good benefit with trigger point injections  5. Tobacco abuse  Pt states she quit  6. Bipolar disorder  Managed by Psych - pt on Gabapentin qHS, Xanax, and lamictal  7. Abnormality of gait with falls  Cont rollator   Will order PT for gait training

## 2018-06-10 ENCOUNTER — Encounter (HOSPITAL_COMMUNITY): Payer: Self-pay

## 2018-06-10 ENCOUNTER — Ambulatory Visit (HOSPITAL_COMMUNITY)
Admission: RE | Admit: 2018-06-10 | Discharge: 2018-06-10 | Disposition: A | Discharge status: Home / Self Care | Payer: Medicare Other | Source: Ambulatory Visit | Attending: Internal Medicine | Admitting: Internal Medicine

## 2018-06-10 DIAGNOSIS — N63 Unspecified lump in unspecified breast: Secondary | ICD-10-CM

## 2018-06-10 DIAGNOSIS — N6310 Unspecified lump in the right breast, unspecified quadrant: Secondary | ICD-10-CM | POA: Diagnosis present

## 2018-06-10 DIAGNOSIS — N6001 Solitary cyst of right breast: Secondary | ICD-10-CM | POA: Diagnosis not present

## 2018-06-11 ENCOUNTER — Encounter: Payer: Medicare Other | Admitting: Physical Medicine & Rehabilitation

## 2018-06-19 ENCOUNTER — Encounter: Payer: Medicare Other | Admitting: Physical Medicine & Rehabilitation

## 2018-06-26 ENCOUNTER — Other Ambulatory Visit: Payer: Self-pay

## 2018-06-26 ENCOUNTER — Encounter: Payer: Medicare Other | Attending: Physical Medicine & Rehabilitation | Admitting: Physical Medicine & Rehabilitation

## 2018-06-26 ENCOUNTER — Encounter: Payer: Self-pay | Admitting: Physical Medicine & Rehabilitation

## 2018-06-26 VITALS — BP 100/65 | HR 91 | Ht 68.0 in | Wt 235.6 lb

## 2018-06-26 DIAGNOSIS — F17210 Nicotine dependence, cigarettes, uncomplicated: Secondary | ICD-10-CM | POA: Insufficient documentation

## 2018-06-26 DIAGNOSIS — M791 Myalgia, unspecified site: Secondary | ICD-10-CM | POA: Diagnosis not present

## 2018-06-26 DIAGNOSIS — M545 Low back pain: Secondary | ICD-10-CM | POA: Diagnosis present

## 2018-06-26 DIAGNOSIS — Z9981 Dependence on supplemental oxygen: Secondary | ICD-10-CM | POA: Diagnosis not present

## 2018-06-26 DIAGNOSIS — R269 Unspecified abnormalities of gait and mobility: Secondary | ICD-10-CM | POA: Diagnosis not present

## 2018-06-26 DIAGNOSIS — J449 Chronic obstructive pulmonary disease, unspecified: Secondary | ICD-10-CM | POA: Insufficient documentation

## 2018-06-26 DIAGNOSIS — F319 Bipolar disorder, unspecified: Secondary | ICD-10-CM | POA: Insufficient documentation

## 2018-06-26 DIAGNOSIS — G479 Sleep disorder, unspecified: Secondary | ICD-10-CM | POA: Insufficient documentation

## 2018-06-26 DIAGNOSIS — I10 Essential (primary) hypertension: Secondary | ICD-10-CM | POA: Diagnosis not present

## 2018-06-26 NOTE — Progress Notes (Signed)
Trigger point injection procedure note: Trigger Point Injection: Written consent was obtained for the patient. Trigger points were identified of bilateral thoracolumbosacral and coccyx PSPs and bilateral gluteal muscles. The areas were cleaned with alcohol, and each of  these trigger points were sprayed with vapocoolant spray. Bilateral thoracic PSP were injected, with patient inability to tolerate injections due to pain and request for injection of coccyx only. 1 cc of 0.5% Marcaine was injected (x5). Needle draw back was performed. There were no complications from the procedure.  

## 2018-07-04 ENCOUNTER — Other Ambulatory Visit: Payer: Self-pay | Admitting: Physical Medicine & Rehabilitation

## 2018-07-23 ENCOUNTER — Telehealth (HOSPITAL_COMMUNITY): Payer: Self-pay | Admitting: Speech Pathology

## 2018-07-23 ENCOUNTER — Other Ambulatory Visit: Payer: Self-pay | Admitting: Physical Medicine & Rehabilitation

## 2018-07-23 NOTE — Telephone Encounter (Signed)
Called pt to schedule ST no answer and voice mail not set up. Donita Brooks at [facility] @ [phone] Ext #[phone] and let her know that pt had been called. NF 06/23/2018

## 2018-07-30 ENCOUNTER — Ambulatory Visit: Payer: Medicare Other | Admitting: Physical Medicine & Rehabilitation

## 2018-07-31 ENCOUNTER — Encounter: Payer: Medicare Other | Attending: Physical Medicine & Rehabilitation | Admitting: Physical Medicine & Rehabilitation

## 2018-07-31 ENCOUNTER — Encounter: Payer: Self-pay | Admitting: Physical Medicine & Rehabilitation

## 2018-07-31 ENCOUNTER — Other Ambulatory Visit: Payer: Self-pay

## 2018-07-31 VITALS — BP 91/64 | HR 90 | Ht 68.0 in | Wt 238.4 lb

## 2018-07-31 DIAGNOSIS — M545 Low back pain, unspecified: Secondary | ICD-10-CM

## 2018-07-31 DIAGNOSIS — I10 Essential (primary) hypertension: Secondary | ICD-10-CM | POA: Insufficient documentation

## 2018-07-31 DIAGNOSIS — Z720 Tobacco use: Secondary | ICD-10-CM

## 2018-07-31 DIAGNOSIS — G479 Sleep disorder, unspecified: Secondary | ICD-10-CM | POA: Insufficient documentation

## 2018-07-31 DIAGNOSIS — F319 Bipolar disorder, unspecified: Secondary | ICD-10-CM | POA: Diagnosis not present

## 2018-07-31 DIAGNOSIS — J449 Chronic obstructive pulmonary disease, unspecified: Secondary | ICD-10-CM | POA: Insufficient documentation

## 2018-07-31 DIAGNOSIS — Z716 Tobacco abuse counseling: Secondary | ICD-10-CM

## 2018-07-31 DIAGNOSIS — M791 Myalgia, unspecified site: Secondary | ICD-10-CM | POA: Diagnosis not present

## 2018-07-31 DIAGNOSIS — Z9981 Dependence on supplemental oxygen: Secondary | ICD-10-CM | POA: Diagnosis not present

## 2018-07-31 DIAGNOSIS — R269 Unspecified abnormalities of gait and mobility: Secondary | ICD-10-CM | POA: Diagnosis not present

## 2018-07-31 DIAGNOSIS — J439 Emphysema, unspecified: Secondary | ICD-10-CM

## 2018-07-31 DIAGNOSIS — R0602 Shortness of breath: Secondary | ICD-10-CM

## 2018-07-31 DIAGNOSIS — G8929 Other chronic pain: Secondary | ICD-10-CM

## 2018-07-31 DIAGNOSIS — R55 Syncope and collapse: Secondary | ICD-10-CM

## 2018-07-31 DIAGNOSIS — G894 Chronic pain syndrome: Secondary | ICD-10-CM | POA: Diagnosis not present

## 2018-07-31 DIAGNOSIS — F17210 Nicotine dependence, cigarettes, uncomplicated: Secondary | ICD-10-CM | POA: Diagnosis not present

## 2018-07-31 MED ORDER — MELOXICAM 15 MG PO TABS: ORAL_TABLET | ORAL | 1 refills | Status: DC

## 2018-07-31 NOTE — Progress Notes (Signed)
Subjective:    Patient ID: Alicia Dixon, female    DOB: 1965-12-07, 52 y.o.   MRN: [id number]  HPI  52 y/o female with pmh of COPD on supplemental O2, HTN, coagulopathy, bipolar disorderTIA who presents for follow up for b/l low back pain R>L.  Initially patient stated: Started ~10 years ago, getting progressively worse.  No alleviating factors.  Prolonged postures exacerbate the pain.  It is sharp and dull, constant.  Non-radiating. Severity is 10/10.  Denies associated numbness, tingling weakness. She has tried IBU, percocet, tylenol without benefit. She was seeing a previous pain management injections, she had trigger point injection, which helped.  She has had falls because of knee instability.  Pain limits activities of daily living.    Last clinic visit 06/26/18. She had trigger point injection at that time.  Since last visit, pt states she has been doing limited HEP due to SOB. She had a fall when she was travelling.  She started smoking again. She states she never received a call about PT. She did not obtain lab work.   Pain Inventory Average Pain 9 Pain Right Now 9 My pain is constant, sharp, dull and aching  In the last 24 hours, has pain interfered with the following? General activity 9 Relation with others 9 Enjoyment of life 9 What TIME of day is your pain at its worst? all Sleep (in general) Poor  Pain is worse with: walking, sitting and standing Pain improves with: heat/ice, therapy/exercise, pacing activities, medication, TENS and injections Relief from Meds: 4  Mobility walk with assistance use a walker Dixon many minutes can you walk? 5 ability to climb steps?  yes do you drive?  yes transfers alone Do you have any goals in this area?  yes  Function I need assistance with the following:  meal prep and household duties Do you have any goals in this area?  yes  Neuro/Psych tingling spasms depression anxiety  Prior Studies Any changes since last visit?   no  Physicians involved in your care Any changes since last visit?  no   Family History  Problem Relation Age of Onset  . Colon cancer Maternal Aunt    Social History   Socioeconomic History  . Marital status: Divorced    Spouse name: Not on file  . Number of children: Not on file  . Years of education: Not on file  . Highest education level: Not on file  Occupational History  . Not on file  Social Needs  . Financial resource strain: Not on file  . Food insecurity:    Worry: Not on file    Inability: Not on file  . Transportation needs:    Medical: Not on file    Non-medical: Not on file  Tobacco Use  . Smoking status: Current Every Day Smoker    Packs/day: 0.25    Years: 35.00    Pack years: 8.75    Types: Cigarettes  . Smokeless tobacco: Never Used  Substance and Sexual Activity  . Alcohol use: No  . Drug use: No  . Sexual activity: Yes    Birth control/protection: Surgical  Lifestyle  . Physical activity:    Days per week: Not on file    Minutes per session: Not on file  . Stress: Not on file  Relationships  . Social connections:    Talks on phone: Not on file    Gets together: Not on file    Attends religious service: Not  on file    Active member of club or organization: Not on file    Attends meetings of clubs or organizations: Not on file    Relationship status: Not on file  Other Topics Concern  . Not on file  Social History Narrative  . Not on file   Past Surgical History:  Procedure Laterality Date  . ABDOMINAL HYSTERECTOMY    . APPENDECTOMY    . CESAREAN SECTION    . CHOLECYSTECTOMY    . LUNG LOBECTOMY     right lung    Past Medical History:  Diagnosis Date  . Asthma   . Bipolar disorder (HCC)   . COPD (chronic obstructive pulmonary disease) (HCC)   . Depression   . Stroke (HCC)    BP 91/64   Pulse 90   Ht 5\' 8"  (1.727 m)   Wt 238 lb 6.4 oz (108.1 kg)   SpO2 91% Comment: on oxygen  BMI 36.25 kg/m   Opioid Risk Score:   Fall  Risk Score:  `1  Depression screen PHQ 2/9  Depression screen [facility] 2/9 07/31/2018 06/26/2018 08/22/2017 01/11/2017 06/07/2016 05/25/2016  Decreased Interest 1 - 0 1 1 2   Down, Depressed, Hopeless 1 - 0 1 1 0  PHQ - 2 Score 2 - 0 2 2 2   Altered sleeping - 3 - - - 3  Tired, decreased energy - 3 - - - 3  Change in appetite - - - - - 1  Feeling bad or failure about yourself  - - - - - 2  Trouble concentrating - - - - - 0  Moving slowly or fidgety/restless - - - - - 1  Suicidal thoughts - - - - - 0  PHQ-9 Score - - - - - 12    Review of Systems  Constitutional: Negative.   HENT: Negative.   Eyes: Negative.   Respiratory: Positive for cough, shortness of breath and wheezing.        Oxygen therapy Respiratory infections   Cardiovascular: Negative.   Gastrointestinal: Negative.   Endocrine: Negative.   Genitourinary: Negative.   Musculoskeletal: Positive for arthralgias, back pain, gait problem and myalgias.       Spasms  Skin: Negative.   Allergic/Immunologic: Negative.   Neurological:       Tingling  Hematological: Negative.   Psychiatric/Behavioral: Positive for dysphoric mood. The patient is nervous/anxious.   All other systems reviewed and are negative.     Objective:   Physical Exam  Gen: NAD. Vital signs reviewed HENT: Normocephalic, Atraumatic. Eyes: EOMI. No discharge.  Cardio: RRR. No JVD. Pulm: +Ronchi and wheezes. Increased WOB. +Supplemental 02. Abd: Nondistended, BS+ MSK:  Gait mildly antalgic  +TTP right gluteal muscles.  No edema.  Neuro:  Strength  4/5 in all LLE myotomes     4-/5 in all RLE myotomes (pain inhibition) Skin: Warm and Dry. Intact.    Assessment & Plan:  52 y/o female with pmh of COPD on supplemental O2, HTN, coagulopathy, bipolar disorder, TIA who presents for follow up of b/l low back pain R>L.   1. Mechanical R>L low back pain  She states she does not have access and a phobia to pools  Cont Heat/Cold   Cont HEP, completed PT  Cont  TENS  Cont Mobic 15mg  daily with food, educated pt not to take other NSAIDs concomitantly.   Labs ordered, encouraged follow up  Cont Tizanidine 4mg  TID PRN.   2. COPD  Cont supplemental O2, 2L,  now requiring increase  Will be weary with benzo/TCA/narcotic administration  Encouraged follow up for adjustment of medications  3. Sleep disturbance  Due to pain  Cont meds per Psych and Tizanidine  Encouraged follow up for sleep study, pt refusing at present  4. Myalgia  Good benefit with trigger point injections  5. Tobacco abuse  Pt states she quit  6. Bipolar disorder  Managed by Psych - pt on Gabapentin qHS, Xanax, and lamictal  7. Abnormality of gait with falls  Cont rollator   Ordered PT for gait training, pt states she never received call back  8. SOB  Follow up with Pulm, requiring increased supplemental O2

## 2018-08-02 ENCOUNTER — Other Ambulatory Visit: Payer: Self-pay | Admitting: Physical Medicine & Rehabilitation

## 2018-08-06 ENCOUNTER — Other Ambulatory Visit (HOSPITAL_COMMUNITY): Payer: Self-pay | Admitting: Internal Medicine

## 2018-08-06 ENCOUNTER — Other Ambulatory Visit: Payer: Self-pay | Admitting: Internal Medicine

## 2018-08-06 DIAGNOSIS — B182 Chronic viral hepatitis C: Secondary | ICD-10-CM

## 2018-08-11 ENCOUNTER — Ambulatory Visit (HOSPITAL_COMMUNITY): Payer: Medicare Other | Admitting: Speech Pathology

## 2018-08-11 ENCOUNTER — Encounter (HOSPITAL_COMMUNITY): Payer: Self-pay

## 2018-08-18 ENCOUNTER — Ambulatory Visit: Payer: Medicare Other

## 2018-08-18 ENCOUNTER — Encounter (HOSPITAL_COMMUNITY): Payer: Self-pay

## 2018-08-18 ENCOUNTER — Ambulatory Visit (HOSPITAL_COMMUNITY): Payer: Medicare Other

## 2018-08-25 ENCOUNTER — Encounter (HOSPITAL_COMMUNITY): Payer: Self-pay

## 2018-08-25 ENCOUNTER — Ambulatory Visit (HOSPITAL_COMMUNITY): Payer: Medicare Other | Attending: Speech Pathology | Admitting: Speech Pathology

## 2018-08-27 ENCOUNTER — Ambulatory Visit (HOSPITAL_COMMUNITY): Payer: Medicare Other

## 2018-08-31 ENCOUNTER — Other Ambulatory Visit: Payer: Self-pay | Admitting: Physical Medicine & Rehabilitation

## 2018-09-02 ENCOUNTER — Other Ambulatory Visit (HOSPITAL_COMMUNITY): Payer: Self-pay | Admitting: Internal Medicine

## 2018-09-02 ENCOUNTER — Ambulatory Visit (HOSPITAL_COMMUNITY)
Admission: RE | Admit: 2018-09-02 | Discharge: 2018-09-02 | Disposition: A | Discharge status: Home / Self Care | Payer: Medicare Other | Source: Ambulatory Visit | Attending: Internal Medicine | Admitting: Internal Medicine

## 2018-09-02 DIAGNOSIS — B182 Chronic viral hepatitis C: Secondary | ICD-10-CM

## 2018-09-02 DIAGNOSIS — Z9049 Acquired absence of other specified parts of digestive tract: Secondary | ICD-10-CM | POA: Diagnosis not present

## 2018-09-03 ENCOUNTER — Ambulatory Visit: Payer: Medicare Other | Admitting: Physical Medicine & Rehabilitation

## 2018-09-18 ENCOUNTER — Encounter: Payer: Medicare Other | Admitting: Physical Medicine & Rehabilitation

## 2018-10-02 ENCOUNTER — Encounter: Payer: Medicare Other | Admitting: Physical Medicine & Rehabilitation

## 2018-10-07 ENCOUNTER — Other Ambulatory Visit: Payer: Self-pay | Admitting: Physical Medicine & Rehabilitation

## 2018-10-16 ENCOUNTER — Encounter: Payer: Medicare Other | Attending: Physical Medicine & Rehabilitation | Admitting: Physical Medicine & Rehabilitation

## 2018-10-16 ENCOUNTER — Encounter: Payer: Self-pay | Admitting: Physical Medicine & Rehabilitation

## 2018-10-16 VITALS — BP 96/67 | HR 92 | Ht 68.0 in | Wt 226.0 lb

## 2018-10-16 DIAGNOSIS — J449 Chronic obstructive pulmonary disease, unspecified: Secondary | ICD-10-CM | POA: Diagnosis not present

## 2018-10-16 DIAGNOSIS — M545 Low back pain: Secondary | ICD-10-CM | POA: Insufficient documentation

## 2018-10-16 DIAGNOSIS — Z9981 Dependence on supplemental oxygen: Secondary | ICD-10-CM | POA: Insufficient documentation

## 2018-10-16 DIAGNOSIS — R269 Unspecified abnormalities of gait and mobility: Secondary | ICD-10-CM | POA: Insufficient documentation

## 2018-10-16 DIAGNOSIS — I10 Essential (primary) hypertension: Secondary | ICD-10-CM | POA: Diagnosis not present

## 2018-10-16 DIAGNOSIS — F319 Bipolar disorder, unspecified: Secondary | ICD-10-CM | POA: Diagnosis not present

## 2018-10-16 DIAGNOSIS — M791 Myalgia, unspecified site: Secondary | ICD-10-CM

## 2018-10-16 DIAGNOSIS — F17210 Nicotine dependence, cigarettes, uncomplicated: Secondary | ICD-10-CM | POA: Diagnosis not present

## 2018-10-16 DIAGNOSIS — G479 Sleep disorder, unspecified: Secondary | ICD-10-CM | POA: Diagnosis not present

## 2018-10-16 NOTE — Progress Notes (Signed)
Trigger point injection procedure note: Trigger Point Injection: Written consent was obtained for the patient. Trigger points were identified of bilateral thoracolumbosacral and coccyx PSPs and bilateral gluteal muscles. The areas were cleaned with alcohol, and each of  these trigger points were sprayed with vapocoolant spray. Bilateral thoracic PSP were injected, with patient inability to tolerate injections due to pain and request for injection of coccyx only. 1 cc of 0.5% Marcaine was injected (x5). Needle draw back was performed. There were no complications from the procedure.

## 2018-11-27 ENCOUNTER — Encounter: Payer: Medicare Other | Admitting: Physical Medicine & Rehabilitation

## 2018-12-03 ENCOUNTER — Ambulatory Visit: Payer: Medicare Other | Admitting: Physical Medicine & Rehabilitation

## 2019-01-05 ENCOUNTER — Ambulatory Visit: Payer: Medicare Other | Admitting: Physical Medicine & Rehabilitation

## 2019-01-09 ENCOUNTER — Encounter: Payer: Medicare Other | Admitting: Physical Medicine & Rehabilitation

## 2019-04-03 ENCOUNTER — Ambulatory Visit: Payer: Medicare Other | Admitting: Internal Medicine

## 2019-04-03 ENCOUNTER — Telehealth: Payer: Self-pay | Admitting: Internal Medicine

## 2019-04-03 ENCOUNTER — Encounter: Payer: Self-pay | Admitting: Internal Medicine

## 2019-04-03 NOTE — Telephone Encounter (Signed)
Patient was a no show and letter sent  °

## 2019-04-16 DIAGNOSIS — J449 Chronic obstructive pulmonary disease, unspecified: Secondary | ICD-10-CM | POA: Diagnosis not present

## 2019-04-16 DIAGNOSIS — J9611 Chronic respiratory failure with hypoxia: Secondary | ICD-10-CM | POA: Diagnosis not present

## 2019-04-17 ENCOUNTER — Emergency Department
Admission: EM | Admit: 2019-04-17 | Discharge: 2019-04-17 | Disposition: A | Discharge status: Home / Self Care | Payer: Medicare Other | Attending: Emergency Medicine | Admitting: Emergency Medicine

## 2019-04-17 ENCOUNTER — Emergency Department: Payer: Medicare Other

## 2019-04-17 ENCOUNTER — Other Ambulatory Visit: Payer: Self-pay

## 2019-04-17 ENCOUNTER — Encounter: Payer: Self-pay | Admitting: Emergency Medicine

## 2019-04-17 DIAGNOSIS — R531 Weakness: Secondary | ICD-10-CM | POA: Diagnosis not present

## 2019-04-17 DIAGNOSIS — M5416 Radiculopathy, lumbar region: Secondary | ICD-10-CM | POA: Diagnosis not present

## 2019-04-17 DIAGNOSIS — Z7982 Long term (current) use of aspirin: Secondary | ICD-10-CM | POA: Insufficient documentation

## 2019-04-17 DIAGNOSIS — R1084 Generalized abdominal pain: Secondary | ICD-10-CM | POA: Diagnosis not present

## 2019-04-17 DIAGNOSIS — F17210 Nicotine dependence, cigarettes, uncomplicated: Secondary | ICD-10-CM | POA: Insufficient documentation

## 2019-04-17 DIAGNOSIS — I10 Essential (primary) hypertension: Secondary | ICD-10-CM | POA: Insufficient documentation

## 2019-04-17 DIAGNOSIS — Z79899 Other long term (current) drug therapy: Secondary | ICD-10-CM | POA: Diagnosis not present

## 2019-04-17 DIAGNOSIS — R000 Tachycardia, unspecified: Secondary | ICD-10-CM | POA: Diagnosis not present

## 2019-04-17 DIAGNOSIS — G894 Chronic pain syndrome: Secondary | ICD-10-CM | POA: Insufficient documentation

## 2019-04-17 DIAGNOSIS — J449 Chronic obstructive pulmonary disease, unspecified: Secondary | ICD-10-CM | POA: Insufficient documentation

## 2019-04-17 DIAGNOSIS — E876 Hypokalemia: Secondary | ICD-10-CM | POA: Diagnosis not present

## 2019-04-17 DIAGNOSIS — R079 Chest pain, unspecified: Secondary | ICD-10-CM | POA: Diagnosis not present

## 2019-04-17 DIAGNOSIS — R0689 Other abnormalities of breathing: Secondary | ICD-10-CM | POA: Diagnosis not present

## 2019-04-17 DIAGNOSIS — K760 Fatty (change of) liver, not elsewhere classified: Secondary | ICD-10-CM | POA: Diagnosis not present

## 2019-04-17 DIAGNOSIS — R110 Nausea: Secondary | ICD-10-CM | POA: Diagnosis not present

## 2019-04-17 LAB — CBC WITH DIFFERENTIAL/PLATELET
Abs Immature Granulocytes: 0.03 10*3/uL (ref 0.00–0.07)
Basophils Absolute: 0.1 10*3/uL (ref 0.0–0.1)
Basophils Relative: 0 %
Eosinophils Absolute: 0 10*3/uL (ref 0.0–0.5)
Eosinophils Relative: 0 %
HCT: 36.7 % (ref 36.0–46.0)
Hemoglobin: 11.5 g/dL — ABNORMAL LOW (ref 12.0–15.0)
Immature Granulocytes: 0 %
Lymphocytes Relative: 22 %
Lymphs Abs: 3.1 10*3/uL (ref 0.7–4.0)
MCH: 23.2 pg — ABNORMAL LOW (ref 26.0–34.0)
MCHC: 31.3 g/dL (ref 30.0–36.0)
MCV: 74.1 fL — ABNORMAL LOW (ref 80.0–100.0)
Monocytes Absolute: 1 10*3/uL (ref 0.1–1.0)
Monocytes Relative: 7 %
Neutro Abs: 9.9 10*3/uL — ABNORMAL HIGH (ref 1.7–7.7)
Neutrophils Relative %: 71 %
Platelets: 678 10*3/uL — ABNORMAL HIGH (ref 150–400)
RBC: 4.95 MIL/uL (ref 3.87–5.11)
RDW: 22.9 % — ABNORMAL HIGH (ref 11.5–15.5)
Smear Review: INCREASED
WBC: 14.1 10*3/uL — ABNORMAL HIGH (ref 4.0–10.5)
nRBC: 0 % (ref 0.0–0.2)

## 2019-04-17 LAB — LACTIC ACID, PLASMA
Lactic Acid, Venous: 2.4 mmol/L (ref 0.5–1.9)
Lactic Acid, Venous: 1.4 mmol/L (ref 0.5–1.9)

## 2019-04-17 LAB — COMPREHENSIVE METABOLIC PANEL
ALT: 16 U/L (ref 0–44)
AST: 26 U/L (ref 15–41)
Albumin: 3.1 g/dL — ABNORMAL LOW (ref 3.5–5.0)
Alkaline Phosphatase: 112 U/L (ref 38–126)
Anion gap: 11 (ref 5–15)
BUN: 7 mg/dL (ref 6–20)
CO2: 24 mmol/L (ref 22–32)
Calcium: 8.8 mg/dL — ABNORMAL LOW (ref 8.9–10.3)
Chloride: 99 mmol/L (ref 98–111)
Creatinine, Ser: 0.38 mg/dL — ABNORMAL LOW (ref 0.44–1.00)
GFR calc Af Amer: >60 mL/min (ref >60)
GFR calc non Af Amer: >60 mL/min (ref >60)
Glucose, Bld: 114 mg/dL — ABNORMAL HIGH (ref 70–99)
Potassium: 2.6 mmol/L — CL (ref 3.5–5.1)
Sodium: 134 mmol/L — ABNORMAL LOW (ref 135–145)
Total Bilirubin: 0.5 mg/dL (ref 0.3–1.2)
Total Protein: 6.8 g/dL (ref 6.5–8.1)

## 2019-04-17 LAB — LIPASE, BLOOD: Lipase: 28 U/L (ref 11–51)

## 2019-04-17 LAB — TROPONIN I (HIGH SENSITIVITY)
Troponin I (High Sensitivity): 6 ng/L (ref <18)
Troponin I (High Sensitivity): 6 ng/L (ref <18)

## 2019-04-17 MED ORDER — GABAPENTIN 100 MG PO CAPS: 100.0000 mg | ORAL_CAPSULE | Freq: Three times a day (TID) | ORAL | 0 refills | Status: DC | PRN

## 2019-04-17 MED ORDER — POTASSIUM CHLORIDE ER 10 MEQ PO TBCR: 10.0000 meq | EXTENDED_RELEASE_TABLET | Freq: Every day | ORAL | 0 refills | Status: DC

## 2019-04-17 MED ORDER — SODIUM CHLORIDE 0.9 % IV BOLUS
1000.0000 mL | Freq: Once | INTRAVENOUS | Status: AC
  Administered 2019-04-17: 14:00:00 1000 mL via INTRAVENOUS

## 2019-04-17 MED ORDER — OXYCODONE-ACETAMINOPHEN 5-325 MG PO TABS
2.0000 | ORAL_TABLET | ORAL | Status: AC
  Administered 2019-04-17: 2 via ORAL
  Filled 2019-04-17: qty 2

## 2019-04-17 MED ORDER — POTASSIUM CHLORIDE CRYS ER 20 MEQ PO TBCR
40.0000 meq | EXTENDED_RELEASE_TABLET | Freq: Once | ORAL | Status: AC
  Administered 2019-04-17: 40 meq via ORAL
  Filled 2019-04-17: qty 2

## 2019-04-17 MED ORDER — POTASSIUM CHLORIDE CRYS ER 20 MEQ PO TBCR
20.0000 meq | EXTENDED_RELEASE_TABLET | Freq: Once | ORAL | Status: AC
  Administered 2019-04-17: 20 meq via ORAL
  Filled 2019-04-17: qty 1

## 2019-04-17 MED ORDER — IOHEXOL 300 MG/ML  SOLN
100.0000 mL | Freq: Once | INTRAMUSCULAR | Status: AC | PRN
  Administered 2019-04-17: 100 mL via INTRAVENOUS

## 2019-04-17 NOTE — ED Triage Notes (Signed)
FIRST NURSE NOTE-intermittent chest pain about every 30 min starting this AM.  Arrived EMS.  VSS.   No meds with EMS.

## 2019-04-17 NOTE — ED Provider Notes (Addendum)
Patient is resting comfortably now.  Given oxycodone for pain as she is reporting significant pain to me that she described as a tingling numbing sharp pain running from her right lower back down to the right leg.  She has strong pulses in the lower extremities bilateral.  Lactate improved.  Blood pressures improved.  Second troponin normal.  Patient resting reports she feels much better.  Clinical Course as of Apr 16 1734  Fri Apr 17, 2019  1627 Strong bilateral pulses lower extremities DP/Pt. Patient alert, well oriented. BP normalized.    [MQ]    Clinical Course User Index [MQ] Sharyn CreamerQuale, , MD    Mr Thoracic Spine Wo Contrast  Result Date: 04/17/2019 CLINICAL DATA:  Numbness and tingling from the mid back into both legs. Bilateral lower extremity weakness. Difficulty walking. Symptoms for 1 month. No known injury. EXAM: MRI THORACIC AND LUMBAR SPINE WITHOUT CONTRAST TECHNIQUE: Multiplanar and multiecho pulse sequences of the thoracic and lumbar spine were obtained without intravenous contrast. COMPARISON:  None. FINDINGS: MRI THORACIC SPINE FINDINGS Alignment:  Normal. Vertebrae: No fracture, evidence of discitis, or bone lesion. Small hemangioma in T10 incidentally noted. Cord:  Normal signal and morphology. Paraspinal and other soft tissues: Negative. Disc levels: The central spinal canal and neural foramina are widely patent throughout. No disc bulge or protrusion. MRI LUMBAR SPINE FINDINGS Segmentation:  Standard. Alignment:  Normal. Vertebrae:  No fracture, evidence of discitis, or bone lesion. Conus medullaris and cauda equina: Conus extends to the L1 level. Conus and cauda equina appear normal. Paraspinal and other soft tissues: Negative. Disc levels: L1-2, L2-3: Negative. L3-4: There is a small right foraminal protrusion which may contact the exiting right L3 root. The central canal and left foramen are widely patent. L4-5: Facet arthropathy and a shallow disc bulge.  No stenosis. L5-S1:  Negative. IMPRESSION: MR THORACIC SPINE IMPRESSION Normal examination. MR LUMBAR SPINE IMPRESSION Small right foraminal protrusion at L3-4 may contact the exiting right L3 root. The exam is otherwise negative. No finding to explain the patient's symptoms. Electronically Signed   By: Drusilla Kannerhomas  Dalessio M.D.   On: 04/17/2019 16:23   Mr Lumbar Spine Wo Contrast  Result Date: 04/17/2019 CLINICAL DATA:  Numbness and tingling from the mid back into both legs. Bilateral lower extremity weakness. Difficulty walking. Symptoms for 1 month. No known injury. EXAM: MRI THORACIC AND LUMBAR SPINE WITHOUT CONTRAST TECHNIQUE: Multiplanar and multiecho pulse sequences of the thoracic and lumbar spine were obtained without intravenous contrast. COMPARISON:  None. FINDINGS: MRI THORACIC SPINE FINDINGS Alignment:  Normal. Vertebrae: No fracture, evidence of discitis, or bone lesion. Small hemangioma in T10 incidentally noted. Cord:  Normal signal and morphology. Paraspinal and other soft tissues: Negative. Disc levels: The central spinal canal and neural foramina are widely patent throughout. No disc bulge or protrusion. MRI LUMBAR SPINE FINDINGS Segmentation:  Standard. Alignment:  Normal. Vertebrae:  No fracture, evidence of discitis, or bone lesion. Conus medullaris and cauda equina: Conus extends to the L1 level. Conus and cauda equina appear normal. Paraspinal and other soft tissues: Negative. Disc levels: L1-2, L2-3: Negative. L3-4: There is a small right foraminal protrusion which may contact the exiting right L3 root. The central canal and left foramen are widely patent. L4-5: Facet arthropathy and a shallow disc bulge.  No stenosis. L5-S1: Negative. IMPRESSION: MR THORACIC SPINE IMPRESSION Normal examination. MR LUMBAR SPINE IMPRESSION Small right foraminal protrusion at L3-4 may contact the exiting right L3 root. The exam is otherwise negative. No finding  to explain the patient's symptoms. Electronically Signed   By: Inge Rise M.D.   On: 04/17/2019 16:23   Ct Abdomen Pelvis W Contrast  Result Date: 04/17/2019 CLINICAL DATA:  Acute generalized abdomen pain. EXAM: CT ABDOMEN AND PELVIS WITH CONTRAST TECHNIQUE: Multidetector CT imaging of the abdomen and pelvis was performed using the standard protocol following bolus administration of intravenous contrast. CONTRAST:  147mL OMNIPAQUE IOHEXOL 300 MG/ML  SOLN COMPARISON:  CT chest March 13, 2017 FINDINGS: Lower chest: There is a 7 mm nodule in the posterior basal segment of the right lower lobe stable compared to prior chest CT of June 03/27/2017. Hepatobiliary: Diffuse low density of liver is identified. No focal liver lesion is noted. Patient status post prior cholecystectomy. The common bile duct measures 11 mm, postsurgical change. Pancreas: Unremarkable. No pancreatic ductal dilatation or surrounding inflammatory changes. Spleen: Normal in size without focal abnormality. Adrenals/Urinary Tract: Adrenal glands are unremarkable. Kidneys are normal, without renal calculi, focal lesion, or hydronephrosis. Bladder is unremarkable. Stomach/Bowel: Stomach is within normal limits. Prior appendectomy. No evidence of bowel wall thickening, distention, or inflammatory changes. The colon is decompressed/collapsed limiting evaluation for bowel wall thickness. Vascular/Lymphatic: Aortic atherosclerosis. No enlarged abdominal or pelvic lymph nodes. Reproductive: Status post hysterectomy. No adnexal masses. Other: None. Musculoskeletal: No acute abnormality. IMPRESSION: No acute abnormality and identified in the abdomen and pelvis. No bowel obstruction. Fatty infiltration of liver. Prior cholecystectomy, appendectomy and hysterectomy. Electronically Signed   By: Abelardo Diesel M.D.   On: 04/17/2019 14:43   Dg Chest Portable 1 View  Result Date: 04/17/2019 CLINICAL DATA:  Chest pain EXAM: PORTABLE CHEST 1 VIEW COMPARISON:  July 31, 2017 FINDINGS: There is slight bibasilar scarring. There  is postoperative change in the medial right apex with localized pleural thickening and scarring in this area. There is no edema or consolidation. Heart size and pulmonary vascularity are normal. No adenopathy. No bone lesions. IMPRESSION: Slight bibasilar scarring. Postoperative change with volume loss medial right apex region. No edema or consolidation. Heart size within normal limits. Electronically Signed   By: Lowella Grip III M.D.   On: 04/17/2019 13:51     Reviewed imaging studies including MRI, suspect likely radicular pain secondary to L2-L3 finding  ----------------------------------------- 5:36 PM on 04/17/2019 -----------------------------------------  Patient resting comfortably.  Reports she is taking gabapentin in the past with good result.  Will prescribe gabapentin, alert oriented no distress.  Appears appropriate for discharge with close outpatient follow-up.  We will also order additional dose of potassium before leaving today and a short prescription for potassium at home.  Patient comfortable with this plan and return precautions she is not driving.  Family picking her up  Return precautions and treatment recommendations and follow-up discussed with the patient who is agreeable with the plan.    Delman Kitten, MD 04/17/19 1737    Delman Kitten, MD 04/17/19 1740

## 2019-04-17 NOTE — ED Notes (Signed)
Pt otf for imaging 

## 2019-04-17 NOTE — ED Notes (Signed)
Pt back from imaging and c/o pain in bilateral legs from the waist down. Dr. Jacqualine Code informed

## 2019-04-17 NOTE — ED Provider Notes (Signed)
[facility] Emergency Department Provider Note   ____________________________________________   First MD Initiated Contact with Patient 04/17/19 1308     (approximate)  I have reviewed the triage vital signs and the nursing notes.   HISTORY  Chief Complaint Chest Pain   HPI Alicia Dixon is a 53 y.o. female with history of chronic pain syndrome.  She has been having numbness and tingling from the waist down for about a month.  She reports that she cannot walk more than a few steps because she gets worse numbness and tingling in her legs feel like you are getting give out.  She had some chest pain.  This was resolved.  When patient arrived in the emergency room she was tachycardic and hypotensive.  This improved laying down.        } Past Medical History:  Diagnosis Date  . Asthma   . Bipolar disorder (HCC)   . COPD (chronic obstructive pulmonary disease) (HCC)   . Depression   . Stroke [facility](HCC)     Patient Active Problem List   Diagnosis Date Noted  . Chronic pain syndrome 07/31/2018  . Chronic right-sided low back pain without sciatica 05/28/2018  . Myalgia 05/09/2018  . COPD with acute exacerbation (HCC) 07/31/2017  . COPD (chronic obstructive pulmonary disease) (HCC) 05/22/2017  . Pneumonia 01/17/2017  . Dysphagia 08/28/2016  . GERD (gastroesophageal reflux disease) 08/28/2016  . HCV antibody positive 08/28/2016  . Shortness of breath 04/16/2016  . Acute on chronic respiratory failure with hypoxia (HCC) 12/28/2015  . Bipolar 1 disorder, mixed, moderate (HCC) 12/28/2015  . Personal history of stroke with current residual effects 12/28/2015  . Essential hypertension 12/28/2015  . Hyperlipidemia, mixed 12/28/2015  . COPD exacerbation (HCC) 12/26/2015    Past Surgical History:  Procedure Laterality Date  . ABDOMINAL HYSTERECTOMY    . APPENDECTOMY    . CESAREAN SECTION    . CHOLECYSTECTOMY    . LUNG LOBECTOMY     right lung      Prior to Admission medications   Medication Sig Start Date End Date Taking? Authorizing Provider  ALPRAZolam Prudy Feeler(XANAX) 1 MG tablet Take 1 mg by mouth 4 (four) times daily as needed for anxiety.  12/10/15   [provider]  aspirin EC 81 MG tablet Take 81 mg by mouth daily.    [provider]  atorvastatin (LIPITOR) 20 MG tablet Take 20 mg by mouth at bedtime. 11/28/15   [provider]  cetirizine (ZYRTEC) 10 MG tablet Take 10 mg by mouth daily.    [provider]  clindamycin (CLEOCIN) 300 MG capsule Take 300 mg by mouth 4 (four) times daily.    [provider]  doxepin (SINEQUAN) 75 MG capsule take 1 capsule at bedtime 09/22/16   [provider]  DULERA 200-5 MCG/ACT AERO INHALE TWO PUFFS BY MOUTH TWICE DAILY 10/10/16   [provider]  gabapentin (NEURONTIN) 300 MG capsule Take 900 mg by mouth at bedtime. 12/19/15   [provider]  HYDROcodone-homatropine (HYCODAN) 5-1.5 MG/5ML syrup Take 5 mLs by mouth every 6 (six) hours as needed for cough.    [provider]  ipratropium-albuterol (DUONEB) 0.5-2.5 (3) MG/3ML SOLN Take 3 mLs by nebulization 4 (four) times daily. 04/19/17   Kari BaarsHawkins, Edward, MD  lamoTRIgine (LAMICTAL) 150 MG tablet Take 300 mg by mouth at bedtime. 12/13/15   [provider]  meloxicam (MOBIC) 15 MG tablet TAKE 1 TABLET(15 MG) BY MOUTH DAILY 08/04/18  Marcello FennelPatel, Ankit Anil, MD  mirtazapine (REMERON) 45 MG tablet Take 45 mg by mouth at bedtime. 12/13/15   [provider]  montelukast (SINGULAIR) 10 MG tablet Take 10 mg by mouth every evening. 11/29/15   [provider]  pantoprazole (PROTONIX) 40 MG tablet Take 1 tablet by mouth every morning.  11/09/16   [provider]  perphenazine (TRILAFON) 8 MG tablet Take 32 mg by mouth at bedtime.  12/13/15   [provider]  predniSONE (DELTASONE) 10 MG tablet Take 10 mg by mouth daily.  11/07/15   [provider]   PROAIR HFA 108 (90 Base) MCG/ACT inhaler Inhale 2 puffs into the lungs every 4 (four) hours as needed. Shortness of breath 12/09/15   [provider]  propranolol (INDERAL) 10 MG tablet Take 1 tablet by mouth 2 (two) times daily. 04/04/17   [provider]  SAPHRIS 10 MG SUBL Place 20 mg under the tongue every evening.  11/29/15   [provider]  tiZANidine (ZANAFLEX) 4 MG tablet TAKE 1 TABLET BY MOUTH EVERY 8 HOURS AS NEEDED FOR MUSCLE SPASMS 10/07/18   Marcello FennelPatel, Ankit Anil, MD    Allergies Other, Baclofen, Flonase  [fluticasone propionate], Gadopentetate dimeglumine, Lortab [hydrocodone-acetaminophen], Seroquel [quetiapine fumarate], Sudafed [pseudoephedrine hcl], and Trazodone and nefazodone  Family History  Problem Relation Age of Onset  . Colon cancer Maternal Aunt     Social History Social History   Tobacco Use  . Smoking status: Current Every Day Smoker    Packs/day: 0.25    Years: 35.00    Pack years: 8.75    Types: Cigarettes  . Smokeless tobacco: Never Used  Substance Use Topics  . Alcohol use: No  . Drug use: No    Review of Systems  Constitutional: No fever/chills Eyes: No visual changes. ENT: No sore throat. Cardiovascular: Denies chest pain. Respiratory: Denies shortness of breath. Gastrointestinal: No abdominal pain.  No nausea, no vomiting.  No diarrhea.  No constipation. Genitourinary: Negative for dysuria. Musculoskeletal: Negative for back pain but see HPI. Skin: Negative for rash. Neurological: Negative for headaches, focal weakness   ____________________________________________   PHYSICAL EXAM:  VITAL SIGNS: ED Triage Vitals  Enc Vitals Group     BP 04/17/19 1302 (!) 81/57     Pulse Rate 04/17/19 1302 (!) 124     Resp 04/17/19 1302 18     Temp 04/17/19 1302 97.9 F (36.6 C)     Temp Source 04/17/19 1302 Oral     SpO2 04/17/19 1302 98 %     Weight 04/17/19 1305 150 lb (68 kg)     Height 04/17/19 1305 5\' 6"  (1.676 m)      Head Circumference --      Peak Flow --      Pain Score 04/17/19 1305 0     Pain Loc --      Pain Edu? --      Excl. in GC? --     Constitutional: Alert and oriented. Well appearing and in no acute distress. Eyes: Conjunctivae are normal.  Head: Atraumatic. Nose: No congestion/rhinnorhea. Mouth/Throat: Mucous membranes are moist.  Oropharynx non-erythematous. Neck: No stridor.   Cardiovascular: Normal rate, regular rhythm. Grossly normal heart sounds.  Good peripheral circulation. Respiratory: Normal respiratory effort.  No retractions. Lungs CTAB. Gastrointestinal: Soft and nontender. No distention. No abdominal bruits. No CVA tenderness.  No tenderness to palpation or percussion of the spine Musculoskeletal: No lower extremity tenderness nor edema.   Neurologic:  Normal speech and language. No gross focal neurologic deficits are appreciated.  Straight leg raise is negative Skin:  Skin is warm, dry and intact. No rash noted.  ____________________________________________   LABS (all labs ordered are listed, but only abnormal results are displayed)  Labs Reviewed  COMPREHENSIVE METABOLIC PANEL - Abnormal; Notable for the following components:      Result Value   Sodium 134 (*)    Potassium 2.6 (*)    Glucose, Bld 114 (*)    Creatinine, Ser 0.38 (*)    Calcium 8.8 (*)    Albumin 3.1 (*)    All other components within normal limits  LACTIC ACID, PLASMA - Abnormal; Notable for the following components:   Lactic Acid, Venous 2.4 (*)    All other components within normal limits  CBC WITH DIFFERENTIAL/PLATELET - Abnormal; Notable for the following components:   WBC 14.1 (*)    Hemoglobin 11.5 (*)    MCV 74.1 (*)    MCH 23.2 (*)    RDW 22.9 (*)    Platelets 678 (*)    Neutro Abs 9.9 (*)    All other components within normal limits  LIPASE, BLOOD  LACTIC ACID, PLASMA  URINALYSIS, COMPLETE (UACMP) WITH MICROSCOPIC  PREGNANCY, URINE  FOLATE RBC  VITAMIN B12  POC URINE  PREG, ED  TROPONIN I (HIGH SENSITIVITY)  TROPONIN I (HIGH SENSITIVITY)   ____________________________________________  EKG EKG read and interpreted by me shows normal sinus rhythm rate of 126 normal axis nonspecific ST-T wave changes ____________________________________________  RADIOLOGY  ED MD interpretation:   Official radiology report(s): Ct Abdomen Pelvis W Contrast  Result Date: 04/17/2019 CLINICAL DATA:  Acute generalized abdomen pain. EXAM: CT ABDOMEN AND PELVIS WITH CONTRAST TECHNIQUE: Multidetector CT imaging of the abdomen and pelvis was performed using the standard protocol following bolus administration of intravenous contrast. CONTRAST:  15mL OMNIPAQUE IOHEXOL 300 MG/ML  SOLN COMPARISON:  CT chest March 13, 2017 FINDINGS: Lower chest: There is a 7 mm nodule in the posterior basal segment of the right lower lobe stable compared to prior chest CT of June 03/27/2017. Hepatobiliary: Diffuse low density of liver is identified. No focal liver lesion is noted. Patient status post prior cholecystectomy. The common bile duct measures 11 mm, postsurgical change. Pancreas: Unremarkable. No pancreatic ductal dilatation or surrounding inflammatory changes. Spleen: Normal in size without focal abnormality. Adrenals/Urinary Tract: Adrenal glands are unremarkable. Kidneys are normal, without renal calculi, focal lesion, or hydronephrosis. Bladder is unremarkable. Stomach/Bowel: Stomach is within normal limits. Prior appendectomy. No evidence of bowel wall thickening, distention, or inflammatory changes. The colon is decompressed/collapsed limiting evaluation for bowel wall thickness. Vascular/Lymphatic: Aortic atherosclerosis. No enlarged abdominal or pelvic lymph nodes. Reproductive: Status post hysterectomy. No adnexal masses. Other: None. Musculoskeletal: No acute abnormality. IMPRESSION: No acute abnormality and identified in the abdomen and pelvis. No bowel obstruction. Fatty infiltration of  liver. Prior cholecystectomy, appendectomy and hysterectomy. Electronically Signed   By: Abelardo Diesel M.D.   On: 04/17/2019 14:43   Dg Chest Portable 1 View  Result Date: 04/17/2019 CLINICAL DATA:  Chest pain EXAM: PORTABLE CHEST 1 VIEW COMPARISON:  July 31, 2017 FINDINGS: There is slight bibasilar scarring. There is postoperative change in the medial right apex with localized pleural thickening and scarring in this area. There is no edema or consolidation. Heart size and pulmonary vascularity are normal. No adenopathy. No bone lesions. IMPRESSION: Slight bibasilar scarring. Postoperative change with volume loss medial right apex region. No edema or consolidation.  Heart size within normal limits. Electronically Signed   By: Bretta BangWilliam  Woodruff III M.D.   On: 04/17/2019 13:51    ____________________________________________   PROCEDURES  Procedure(s) performed (including Critical Care):  Procedures   ____________________________________________   INITIAL IMPRESSION / ASSESSMENT AND PLAN / ED COURSE    signed out to Dr Fanny BienQuale.          ____________________________________________   FINAL CLINICAL IMPRESSION(S) / ED DIAGNOSES  Final diagnoses:  Generalized abdominal pain     ED Discharge Orders    None       Note:  This document was prepared using Dragon voice recognition software and may include unintentional dictation errors.    Arnaldo NatalMalinda, Paul F, MD 04/17/19 [phone]

## 2019-04-17 NOTE — Discharge Instructions (Signed)
°  Please return to the emergency room right away if you are to develop a fever, severe nausea, your pain or numbness becomes severe or worsens, you are unable to keep food down, begin vomiting any dark or bloody fluid, you develop any dark or bloody stools, feel dehydrated, or other new concerns or symptoms arise.

## 2019-04-17 NOTE — ED Notes (Signed)
Dr. Quale at bedside.  

## 2019-04-17 NOTE — ED Triage Notes (Signed)
Pt presents to ED c/o mid chest pain starting 0300 and painful tingling from waist down x1 month. Pt states she called her PCP about the tingling and was told they didn't know what to do for her. BP 81/57, HR 124 in triage

## 2019-04-18 LAB — VITAMIN B12: Vitamin B-12: 321 pg/mL (ref 180–914)

## 2019-04-20 LAB — FOLATE RBC
Folate, Hemolysate: 238 ng/mL
Folate, RBC: 702 ng/mL (ref >498)
Hematocrit: 33.9 % — ABNORMAL LOW (ref 34.0–46.6)

## 2019-04-21 DIAGNOSIS — R6889 Other general symptoms and signs: Secondary | ICD-10-CM | POA: Diagnosis not present

## 2019-04-22 DIAGNOSIS — J9611 Chronic respiratory failure with hypoxia: Secondary | ICD-10-CM | POA: Diagnosis not present

## 2019-04-22 DIAGNOSIS — J449 Chronic obstructive pulmonary disease, unspecified: Secondary | ICD-10-CM | POA: Diagnosis not present

## 2019-04-23 ENCOUNTER — Other Ambulatory Visit: Payer: Self-pay | Admitting: Physical Medicine & Rehabilitation

## 2019-04-23 ENCOUNTER — Telehealth: Payer: Self-pay

## 2019-04-23 MED ORDER — MELOXICAM 15 MG PO TABS: ORAL_TABLET | ORAL | 0 refills | Status: DC

## 2019-04-23 MED ORDER — TIZANIDINE HCL 4 MG PO TABS: ORAL_TABLET | ORAL | 0 refills | Status: DC

## 2019-04-23 NOTE — Telephone Encounter (Signed)
Patient called asking for an appointment and med refills.  Refilled Mobic and Zanaflex per clinical protocol, follow up appointment made for Dr. Posey Pronto on 05-08-2019

## 2019-04-24 ENCOUNTER — Telehealth: Payer: Self-pay | Admitting: *Deleted

## 2019-04-24 MED ORDER — GABAPENTIN 100 MG PO CAPS: 300.0000 mg | ORAL_CAPSULE | Freq: Three times a day (TID) | ORAL | 0 refills | Status: DC

## 2019-04-24 NOTE — Telephone Encounter (Signed)
Ms Furgerson called and she is having increased pain and is having trouble walking because of it.  She is scheduled to see the neurosurgeon on 05/04/19 and has appt with you on 05/08/19. She says she has 5 disks that are causing problems. She has tingling from her breast bone down.  She is asking for something for pain just to get her through until 7/27 appt. Please advise.

## 2019-04-24 NOTE — Telephone Encounter (Signed)
She can increase to 300 TID, this will be more effective for neuropathic pain.

## 2019-04-24 NOTE — Telephone Encounter (Signed)
Someone prescribed that for her on 04/17/19 and she said it hasn't helped at all.  She has not slept in 4 days and has kept everyone else up crying in pain.

## 2019-04-24 NOTE — Telephone Encounter (Addendum)
She did not want the gabapentin,  so she will call her primary or go to the ED if unrelieved.

## 2019-04-24 NOTE — Telephone Encounter (Signed)
We can prescribe gabapentin 100 3 times daily.  Thanks.

## 2019-04-24 NOTE — Addendum Note (Signed)
Addended by: Caro Hight on: 04/24/2019 02:38 PM   Modules accepted: Orders

## 2019-04-27 ENCOUNTER — Telehealth: Payer: Self-pay | Admitting: Physical Medicine & Rehabilitation

## 2019-04-27 NOTE — Telephone Encounter (Signed)
Patient is calling to request pain medication.  States she can't even walk and needs something for pain.  Please call patient.

## 2019-04-27 NOTE — Telephone Encounter (Signed)
I notified Alicia Dixon that she did not want the gabapentin Dr Posey Pronto offered last week and now he is out of the office for the week on vacation.  She will have to contact her PCP.

## 2019-04-28 ENCOUNTER — Telehealth: Payer: Self-pay | Admitting: Internal Medicine

## 2019-04-28 NOTE — Telephone Encounter (Signed)
COVID-19 Pre-Screening Questions:04/28/19 ° °Do you currently have a fever (>100 °F), chills or unexplained body aches? NO ° °Are you currently experiencing new cough, shortness of breath, sore throat, runny nose? NO °•  °Have you recently travelled outside the state of [place] in the last 14 days? NO  °•  °Have you been in contact with someone that is currently pending confirmation of Covid19 testing or has been confirmed to have the Covid19 virus?  NO  ° °**If the patient answers NO to ALL questions -  advise the patient to please call the clinic before coming to the office should any symptoms develop.  ° ° °

## 2019-04-29 ENCOUNTER — Ambulatory Visit: Payer: Medicare Other | Admitting: Internal Medicine

## 2019-05-04 DIAGNOSIS — M5416 Radiculopathy, lumbar region: Secondary | ICD-10-CM | POA: Diagnosis not present

## 2019-05-06 DIAGNOSIS — R112 Nausea with vomiting, unspecified: Secondary | ICD-10-CM | POA: Diagnosis not present

## 2019-05-08 ENCOUNTER — Ambulatory Visit: Payer: Medicare Other | Admitting: Physical Medicine & Rehabilitation

## 2019-05-08 ENCOUNTER — Encounter: Payer: Medicare Other | Admitting: Physical Medicine & Rehabilitation

## 2019-05-08 DIAGNOSIS — R197 Diarrhea, unspecified: Secondary | ICD-10-CM | POA: Diagnosis not present

## 2019-05-12 DIAGNOSIS — M62412 Contracture of muscle, left shoulder: Secondary | ICD-10-CM | POA: Diagnosis not present

## 2019-05-12 DIAGNOSIS — R269 Unspecified abnormalities of gait and mobility: Secondary | ICD-10-CM | POA: Diagnosis not present

## 2019-05-12 DIAGNOSIS — J449 Chronic obstructive pulmonary disease, unspecified: Secondary | ICD-10-CM | POA: Diagnosis not present

## 2019-05-12 DIAGNOSIS — M545 Low back pain: Secondary | ICD-10-CM | POA: Diagnosis not present

## 2019-05-17 DIAGNOSIS — J9611 Chronic respiratory failure with hypoxia: Secondary | ICD-10-CM | POA: Diagnosis not present

## 2019-05-17 DIAGNOSIS — J449 Chronic obstructive pulmonary disease, unspecified: Secondary | ICD-10-CM | POA: Diagnosis not present

## 2019-05-18 ENCOUNTER — Encounter: Payer: Medicare Other | Admitting: Physical Medicine & Rehabilitation

## 2019-05-18 ENCOUNTER — Emergency Department
Admission: EM | Admit: 2019-05-18 | Discharge: 2019-05-18 | Disposition: A | Discharge status: Home / Self Care | Payer: Medicare Other | Attending: Emergency Medicine | Admitting: Emergency Medicine

## 2019-05-18 ENCOUNTER — Other Ambulatory Visit: Payer: Self-pay

## 2019-05-18 ENCOUNTER — Encounter: Payer: Self-pay | Admitting: Emergency Medicine

## 2019-05-18 DIAGNOSIS — J449 Chronic obstructive pulmonary disease, unspecified: Secondary | ICD-10-CM | POA: Diagnosis not present

## 2019-05-18 DIAGNOSIS — I10 Essential (primary) hypertension: Secondary | ICD-10-CM | POA: Diagnosis not present

## 2019-05-18 DIAGNOSIS — G8929 Other chronic pain: Secondary | ICD-10-CM

## 2019-05-18 DIAGNOSIS — F17210 Nicotine dependence, cigarettes, uncomplicated: Secondary | ICD-10-CM | POA: Diagnosis not present

## 2019-05-18 DIAGNOSIS — Z79899 Other long term (current) drug therapy: Secondary | ICD-10-CM | POA: Diagnosis not present

## 2019-05-18 DIAGNOSIS — M5441 Lumbago with sciatica, right side: Secondary | ICD-10-CM | POA: Insufficient documentation

## 2019-05-18 DIAGNOSIS — Z8673 Personal history of transient ischemic attack (TIA), and cerebral infarction without residual deficits: Secondary | ICD-10-CM | POA: Insufficient documentation

## 2019-05-18 DIAGNOSIS — Z7982 Long term (current) use of aspirin: Secondary | ICD-10-CM | POA: Insufficient documentation

## 2019-05-18 DIAGNOSIS — M545 Low back pain: Secondary | ICD-10-CM | POA: Diagnosis present

## 2019-05-18 DIAGNOSIS — M5489 Other dorsalgia: Secondary | ICD-10-CM | POA: Diagnosis not present

## 2019-05-18 DIAGNOSIS — R110 Nausea: Secondary | ICD-10-CM | POA: Diagnosis not present

## 2019-05-18 DIAGNOSIS — R5381 Other malaise: Secondary | ICD-10-CM | POA: Diagnosis not present

## 2019-05-18 LAB — COMPREHENSIVE METABOLIC PANEL
ALT: 10 U/L (ref 0–44)
AST: 24 U/L (ref 15–41)
Albumin: 3.5 g/dL (ref 3.5–5.0)
Alkaline Phosphatase: 118 U/L (ref 38–126)
Anion gap: 13 (ref 5–15)
BUN: 11 mg/dL (ref 6–20)
CO2: 19 mmol/L — ABNORMAL LOW (ref 22–32)
Calcium: 9.4 mg/dL (ref 8.9–10.3)
Chloride: 106 mmol/L (ref 98–111)
Creatinine, Ser: 0.53 mg/dL (ref 0.44–1.00)
GFR calc Af Amer: >60 mL/min (ref >60)
GFR calc non Af Amer: >60 mL/min (ref >60)
Glucose, Bld: 96 mg/dL (ref 70–99)
Potassium: 3.3 mmol/L — ABNORMAL LOW (ref 3.5–5.1)
Sodium: 138 mmol/L (ref 135–145)
Total Bilirubin: 1 mg/dL (ref 0.3–1.2)
Total Protein: 7.5 g/dL (ref 6.5–8.1)

## 2019-05-18 LAB — CBC
HCT: 38.9 % (ref 36.0–46.0)
Hemoglobin: 11.9 g/dL — ABNORMAL LOW (ref 12.0–15.0)
MCH: 24.2 pg — ABNORMAL LOW (ref 26.0–34.0)
MCHC: 30.6 g/dL (ref 30.0–36.0)
MCV: 79.1 fL — ABNORMAL LOW (ref 80.0–100.0)
Platelets: 766 10*3/uL — ABNORMAL HIGH (ref 150–400)
RBC: 4.92 MIL/uL (ref 3.87–5.11)
RDW: 18.8 % — ABNORMAL HIGH (ref 11.5–15.5)
WBC: 11.2 10*3/uL — ABNORMAL HIGH (ref 4.0–10.5)
nRBC: 0 % (ref 0.0–0.2)

## 2019-05-18 LAB — LIPASE, BLOOD: Lipase: 25 U/L (ref 11–51)

## 2019-05-18 MED ORDER — MORPHINE SULFATE (PF) 4 MG/ML IV SOLN
4.0000 mg | Freq: Once | INTRAVENOUS | Status: AC
  Administered 2019-05-18: 15:00:00 4 mg via INTRAVENOUS
  Filled 2019-05-18: qty 1

## 2019-05-18 MED ORDER — SODIUM CHLORIDE 0.9% FLUSH: 3.0000 mL | Freq: Once | INTRAVENOUS | Status: DC

## 2019-05-18 MED ORDER — ONDANSETRON HCL 4 MG/2ML IJ SOLN
4.0000 mg | Freq: Once | INTRAMUSCULAR | Status: AC
  Administered 2019-05-18: 15:00:00 4 mg via INTRAVENOUS
  Filled 2019-05-18: qty 2

## 2019-05-18 MED ORDER — DEXAMETHASONE SODIUM PHOSPHATE 10 MG/ML IJ SOLN
10.0000 mg | Freq: Once | INTRAMUSCULAR | Status: AC
  Administered 2019-05-18: 15:00:00 10 mg via INTRAVENOUS
  Filled 2019-05-18: qty 1

## 2019-05-18 MED ORDER — MORPHINE SULFATE (PF) 4 MG/ML IV SOLN
4.0000 mg | Freq: Once | INTRAVENOUS | Status: AC
  Administered 2019-05-18: 16:00:00 4 mg via INTRAVENOUS
  Filled 2019-05-18: qty 1

## 2019-05-18 MED ORDER — KETOROLAC TROMETHAMINE 30 MG/ML IJ SOLN
30.0000 mg | Freq: Once | INTRAMUSCULAR | Status: AC
  Administered 2019-05-18: 16:00:00 30 mg via INTRAVENOUS
  Filled 2019-05-18: qty 1

## 2019-05-18 MED ORDER — SODIUM CHLORIDE 0.9 % IV SOLN
1000.0000 mL | Freq: Once | INTRAVENOUS | Status: AC
  Administered 2019-05-18: 15:00:00 1000 mL via INTRAVENOUS

## 2019-05-18 MED ORDER — ONDANSETRON HCL 4 MG/2ML IJ SOLN
4.0000 mg | Freq: Once | INTRAMUSCULAR | Status: AC | PRN
  Administered 2019-05-18: 13:00:00 4 mg via INTRAVENOUS
  Filled 2019-05-18: qty 2

## 2019-05-18 NOTE — ED Triage Notes (Signed)
Back pain 3 months ago.  o injury.  recenlty seen for bulging disk.

## 2019-05-18 NOTE — ED Triage Notes (Signed)
Triage by me.  Had appt with pain clinic, but they wouldn't see her because she is nauseated fromt he pain.

## 2019-05-18 NOTE — ED Notes (Signed)

## 2019-05-18 NOTE — ED Provider Notes (Addendum)
[facility] Emergency Department Provider Note   ____________________________________________    I have reviewed the triage vital signs and the nursing notes.   HISTORY  Chief Complaint Back Pain     HPI Alicia Dixon is a 53 y.o. female with a history of bipolar disorder, COPD, chronic back pain presents today with complaints of back pain radiating into her right leg, she reports this is been going on for greater than 8 weeks.  She reports is quite painful to ambulate.  She reports she went to her pain doctor today but because she is having nausea from being in so much pain they would not see her.  She has not taken anything today for this.  Denies fevers or chills.  Had an MRI which demonstrated "a pinched nerve " Past Medical History:  Diagnosis Date  . Asthma   . Bipolar disorder ([place])   . COPD (chronic obstructive pulmonary disease) ([place])   . Depression   . Stroke [facility])     Patient Active Problem List   Diagnosis Date Noted  . Chronic pain syndrome 07/31/2018  . Chronic right-sided low back pain without sciatica 05/28/2018  . Myalgia 05/09/2018  . COPD with acute exacerbation ([place]) 07/31/2017  . COPD (chronic obstructive pulmonary disease) ([place]) 05/22/2017  . Pneumonia 01/17/2017  . Dysphagia 08/28/2016  . GERD (gastroesophageal reflux disease) 08/28/2016  . HCV antibody positive 08/28/2016  . Shortness of breath 04/16/2016  . Acute on chronic respiratory failure with hypoxia ([place]) 12/28/2015  . Bipolar 1 disorder, mixed, moderate ([place]) 12/28/2015  . Personal history of stroke with current residual effects 12/28/2015  . Essential hypertension 12/28/2015  . Hyperlipidemia, mixed 12/28/2015  . COPD exacerbation ([place]) 12/26/2015    Past Surgical History:  Procedure Laterality Date  . ABDOMINAL HYSTERECTOMY    . APPENDECTOMY    . CESAREAN SECTION    . CHOLECYSTECTOMY    . LUNG LOBECTOMY     right lung     Prior to Admission  medications   Medication Sig Start Date End Date Taking? Authorizing Provider  ALPRAZolam Duanne Moron) 1 MG tablet Take 1 mg by mouth 4 (four) times daily as needed for anxiety.  12/10/15   [provider]  aspirin EC 81 MG tablet Take 81 mg by mouth daily.    [provider]  atorvastatin (LIPITOR) 20 MG tablet Take 20 mg by mouth at bedtime. 11/28/15   [provider]  cetirizine (ZYRTEC) 10 MG tablet Take 10 mg by mouth daily.    [provider]  clindamycin (CLEOCIN) 300 MG capsule Take 300 mg by mouth 4 (four) times daily.    [provider]  doxepin (SINEQUAN) 75 MG capsule take 1 capsule at bedtime 09/22/16   [provider]  DULERA 200-5 MCG/ACT AERO INHALE TWO PUFFS BY MOUTH TWICE DAILY 10/10/16   [provider]  gabapentin (NEURONTIN) 100 MG capsule Take 1 capsule (100 mg total) by mouth 3 (three) times daily as needed (right lower back and leg pain). 04/17/19   Delman Kitten, MD  HYDROcodone-homatropine (HYCODAN) 5-1.5 MG/5ML syrup Take 5 mLs by mouth every 6 (six) hours as needed for cough.    [provider]  ipratropium-albuterol (DUONEB) 0.5-2.5 (3) MG/3ML SOLN Take 3 mLs by nebulization 4 (four) times daily. 04/19/17   Sinda Du, MD  lamoTRIgine (LAMICTAL) 150 MG tablet Take 300 mg by mouth at bedtime. 12/13/15   [provider]  meloxicam (MOBIC) 15 MG  tablet TAKE 1 TABLET(15 MG) BY MOUTH DAILY 04/23/19   Marcello FennelPatel, Ankit Anil, MD  mirtazapine (REMERON) 45 MG tablet Take 45 mg by mouth at bedtime. 12/13/15   [provider]  montelukast (SINGULAIR) 10 MG tablet Take 10 mg by mouth every evening. 11/29/15   [provider]  pantoprazole (PROTONIX) 40 MG tablet Take 1 tablet by mouth every morning.  11/09/16   [provider]  perphenazine (TRILAFON) 8 MG tablet Take 32 mg by mouth at bedtime.  12/13/15   [provider]  potassium chloride (K-DUR) 10 MEQ tablet Take 1 tablet (10 mEq  total) by mouth daily. 04/17/19   Sharyn CreamerQuale, Mark, MD  predniSONE (DELTASONE) 10 MG tablet Take 10 mg by mouth daily.  11/07/15   [provider]  PROAIR HFA 108 (90 Base) MCG/ACT inhaler Inhale 2 puffs into the lungs every 4 (four) hours as needed. Shortness of breath 12/09/15   [provider]  propranolol (INDERAL) 10 MG tablet Take 1 tablet by mouth 2 (two) times daily. 04/04/17   [provider]  SAPHRIS 10 MG SUBL Place 20 mg under the tongue every evening.  11/29/15   [provider]  tiZANidine (ZANAFLEX) 4 MG tablet TAKE 1 TABLET BY MOUTH EVERY 8 HOURS AS NEEDED FOR MUSCLE SPASMS 04/23/19   Marcello FennelPatel, Ankit Anil, MD     Allergies Other, Baclofen, Flonase  [fluticasone propionate], Gadopentetate dimeglumine, Lortab [hydrocodone-acetaminophen], Seroquel [quetiapine fumarate], Sudafed [pseudoephedrine hcl], Tramadol, and Trazodone and nefazodone  Family History  Problem Relation Age of Onset  . Colon cancer Maternal Aunt     Social History Social History   Tobacco Use  . Smoking status: Current Every Day Smoker    Packs/day: 0.25    Years: 35.00    Pack years: 8.75    Types: Cigarettes  . Smokeless tobacco: Never Used  Substance Use Topics  . Alcohol use: No  . Drug use: No    Review of Systems  Constitutional: No fever/chills Eyes: No visual changes.  ENT: No sore throat. Cardiovascular: Denies chest pain. Respiratory: Denies shortness of breath. Gastrointestinal: No abdominal pain.  No nausea, no vomiting.   Genitourinary: No incontinence Musculoskeletal: As above Skin: Negative for rash. Neurological: No lower extremity weakness   ____________________________________________   PHYSICAL EXAM:  VITAL SIGNS: ED Triage Vitals  Enc Vitals Group     BP 05/18/19 1207 118/69     Pulse Rate 05/18/19 1207 (!) 138     Resp 05/18/19 1207 (!) 22     Temp 05/18/19 1207 99 F (37.2 C)     Temp Source 05/18/19 1207 Oral     SpO2 05/18/19 1207  100 %     Weight 05/18/19 1207 74.4 kg (164 lb)     Height 05/18/19 1207 1.715 m (5' 7.5")     Head Circumference --      Peak Flow --      Pain Score 05/18/19 1224 10     Pain Loc --      Pain Edu? --      Excl. in GC? --     Constitutional: Alert and oriented.   Nose: No congestion/rhinnorhea. Mouth/Throat: Mucous membranes are moist.    Cardiovascular: Normal rate, regular rhythm. Grossly normal heart sounds.  Good peripheral circulation. Respiratory: Normal respiratory effort.  No retractions. Lungs CTAB. Gastrointestinal: Soft and nontender. No distention.  No CVA tenderness.  No pulsatile mass  Musculoskeletal:   Warm and well perfused extremities, normal strength  in the lower extremities, Neurologic:  Normal speech and language. No gross focal neurologic deficits are appreciated.  No saddle anesthesia, normal reflexes in the lower extremities Skin:  Skin is warm, dry and intact. No rash noted. Psychiatric: Mood and affect are normal. Speech and behavior are normal.  ____________________________________________   LABS (all labs ordered are listed, but only abnormal results are displayed)  Labs Reviewed  COMPREHENSIVE METABOLIC PANEL - Abnormal; Notable for the following components:      Result Value   Potassium 3.3 (*)    CO2 19 (*)    All other components within normal limits  CBC - Abnormal; Notable for the following components:   WBC 11.2 (*)    Hemoglobin 11.9 (*)    MCV 79.1 (*)    MCH 24.2 (*)    RDW 18.8 (*)    Platelets 766 (*)    All other components within normal limits  LIPASE, BLOOD  URINALYSIS, COMPLETE (UACMP) WITH MICROSCOPIC   ____________________________________________  EKG   ____________________________________________  RADIOLOGY   ____________________________________________   PROCEDURES  Procedure(s) performed: No  Procedures   Critical Care performed: No ____________________________________________   INITIAL IMPRESSION  / ASSESSMENT AND PLAN / ED COURSE  Pertinent labs & imaging results that were available during my care of the patient were reviewed by me and considered in my medical decision making (see chart for details).  Patient with chronic back pain, not significantly changed from prior, no weakness saddle anesthesia or alarming symptoms.  Will treat with IV Decadron, IV morphine, IV Zofran and reevaluate lab work demonstrates elevated platelets likely acute phase related to her severe anxiety  Patient reports minimal improvement, will add IV Toradol and a second dose of IV morphine discussed with her that she will need to follow up with her pain physician for further management of this chronic pain   ----------------------------------------- 3:23 PM on 05/18/2019 -----------------------------------------  I evaluate the patient prior to discharge, heart rate is 89    ____________________________________________   FINAL CLINICAL IMPRESSION(S) / ED DIAGNOSES  Final diagnoses:  Chronic right-sided low back pain with right-sided sciatica        Note:  This document was prepared using Dragon voice recognition software and may include unintentional dictation errors.   Jene EveryKinner, Jeris Roser, MD 05/18/19 1521    Jene EveryKinner, Toretto Tingler, MD 05/18/19 [phone]

## 2019-05-21 ENCOUNTER — Ambulatory Visit: Payer: Medicare Other | Admitting: Physical Medicine & Rehabilitation

## 2019-05-23 DIAGNOSIS — J449 Chronic obstructive pulmonary disease, unspecified: Secondary | ICD-10-CM | POA: Diagnosis not present

## 2019-05-23 DIAGNOSIS — J9611 Chronic respiratory failure with hypoxia: Secondary | ICD-10-CM | POA: Diagnosis not present

## 2019-05-25 ENCOUNTER — Telehealth: Payer: Self-pay

## 2019-05-25 ENCOUNTER — Encounter: Payer: Medicare Other | Admitting: Physical Medicine & Rehabilitation

## 2019-05-25 NOTE — Telephone Encounter (Signed)
Patient called and left a message on the triage line and stated that she needed refills on her pain medication or something to be sent to the pharmacy for her pain. She canceled her appointment today and rescheduled in October. She said she is in so much pain she feels sick and can not eat. She stated that "the pain is so bad that it is ungodly". Please advise patient.

## 2019-05-25 NOTE — Telephone Encounter (Signed)
We can refill her Mobic, Tizanidine, however, I need to see her for follow up.  It has been >10 months since I have seen her for follow up visit. She needs to be evaluated.

## 2019-05-26 ENCOUNTER — Telehealth: Payer: Self-pay

## 2019-05-26 NOTE — Telephone Encounter (Signed)
Patient called and left a message on the triage line and stated that she had to go to the emergency room because she was in so much pain. She was given morphine through her IV and it did not help. Her appointment she rescheduled for October with Dr Posey Pronto but she wants medication called in now. Telephone call on 05/25/2019 in chart discusses this as well.

## 2019-05-28 ENCOUNTER — Telehealth: Payer: Self-pay

## 2019-05-28 MED ORDER — MELOXICAM 15 MG PO TABS: ORAL_TABLET | ORAL | 1 refills | Status: DC

## 2019-05-28 MED ORDER — TIZANIDINE HCL 4 MG PO TABS: ORAL_TABLET | ORAL | 1 refills | Status: DC

## 2019-05-28 NOTE — Telephone Encounter (Signed)
error 

## 2019-05-28 NOTE — Telephone Encounter (Signed)
Patient called, informed her of Dr. Serita Grit response and sending in refill.

## 2019-05-30 NOTE — Telephone Encounter (Signed)
This was addressed after the telephone call on 8/17 and medications were refilled one time, but as stated, she has not been seen in >10 months for follow up and needs to be evaluated. I have not heard back after these medications were refilled.  Further, this is not her chronic pain and needs to be seen.  If I do not have an appointment than with Alicia Dixon, however, given her MRI findings, she should be evaluated to discuss possibility of ESI +/- addition of neuropathic pain meds. She received IV Decadron, IV morphine, IV Zofran and Toradol with minimal benefit, so prescribing more PO narcotics is not the answer.

## 2019-06-01 ENCOUNTER — Ambulatory Visit: Payer: Medicare Other | Admitting: Internal Medicine

## 2019-06-02 NOTE — Telephone Encounter (Signed)
Patient called back and sent in muscle relaxer for her and made an appt to see Dr. Posey Pronto

## 2019-06-08 ENCOUNTER — Encounter (HOSPITAL_COMMUNITY): Payer: Self-pay

## 2019-06-08 ENCOUNTER — Emergency Department (HOSPITAL_COMMUNITY)
Admission: EM | Admit: 2019-06-08 | Discharge: 2019-06-09 | Disposition: A | Discharge status: Home / Self Care | Payer: Medicare Other | Attending: Emergency Medicine | Admitting: Emergency Medicine

## 2019-06-08 ENCOUNTER — Emergency Department (HOSPITAL_COMMUNITY): Payer: Medicare Other

## 2019-06-08 ENCOUNTER — Other Ambulatory Visit: Payer: Self-pay

## 2019-06-08 DIAGNOSIS — N3090 Cystitis, unspecified without hematuria: Secondary | ICD-10-CM | POA: Diagnosis not present

## 2019-06-08 DIAGNOSIS — R1084 Generalized abdominal pain: Secondary | ICD-10-CM | POA: Diagnosis not present

## 2019-06-08 DIAGNOSIS — R109 Unspecified abdominal pain: Secondary | ICD-10-CM | POA: Diagnosis present

## 2019-06-08 DIAGNOSIS — K760 Fatty (change of) liver, not elsewhere classified: Secondary | ICD-10-CM | POA: Diagnosis not present

## 2019-06-08 DIAGNOSIS — I10 Essential (primary) hypertension: Secondary | ICD-10-CM | POA: Diagnosis not present

## 2019-06-08 DIAGNOSIS — R1030 Lower abdominal pain, unspecified: Secondary | ICD-10-CM | POA: Diagnosis not present

## 2019-06-08 DIAGNOSIS — J449 Chronic obstructive pulmonary disease, unspecified: Secondary | ICD-10-CM | POA: Diagnosis not present

## 2019-06-08 DIAGNOSIS — Z79899 Other long term (current) drug therapy: Secondary | ICD-10-CM | POA: Diagnosis not present

## 2019-06-08 DIAGNOSIS — R110 Nausea: Secondary | ICD-10-CM | POA: Diagnosis not present

## 2019-06-08 DIAGNOSIS — Z8673 Personal history of transient ischemic attack (TIA), and cerebral infarction without residual deficits: Secondary | ICD-10-CM | POA: Insufficient documentation

## 2019-06-08 DIAGNOSIS — I959 Hypotension, unspecified: Secondary | ICD-10-CM | POA: Diagnosis not present

## 2019-06-08 DIAGNOSIS — F17210 Nicotine dependence, cigarettes, uncomplicated: Secondary | ICD-10-CM | POA: Insufficient documentation

## 2019-06-08 DIAGNOSIS — Z7982 Long term (current) use of aspirin: Secondary | ICD-10-CM | POA: Diagnosis not present

## 2019-06-08 DIAGNOSIS — R000 Tachycardia, unspecified: Secondary | ICD-10-CM | POA: Diagnosis not present

## 2019-06-08 LAB — CBC
HCT: 36 % (ref 36.0–46.0)
Hemoglobin: 11.3 g/dL — ABNORMAL LOW (ref 12.0–15.0)
MCH: 25.4 pg — ABNORMAL LOW (ref 26.0–34.0)
MCHC: 31.4 g/dL (ref 30.0–36.0)
MCV: 80.9 fL (ref 80.0–100.0)
Platelets: 512 10*3/uL — ABNORMAL HIGH (ref 150–400)
RBC: 4.45 MIL/uL (ref 3.87–5.11)
RDW: 17.6 % — ABNORMAL HIGH (ref 11.5–15.5)
WBC: 15.1 10*3/uL — ABNORMAL HIGH (ref 4.0–10.5)
nRBC: 0 % (ref 0.0–0.2)

## 2019-06-08 LAB — COMPREHENSIVE METABOLIC PANEL
ALT: 13 U/L (ref 0–44)
AST: 32 U/L (ref 15–41)
Albumin: 3.6 g/dL (ref 3.5–5.0)
Alkaline Phosphatase: 88 U/L (ref 38–126)
Anion gap: 11 (ref 5–15)
BUN: 16 mg/dL (ref 6–20)
CO2: 23 mmol/L (ref 22–32)
Calcium: 9.3 mg/dL (ref 8.9–10.3)
Chloride: 101 mmol/L (ref 98–111)
Creatinine, Ser: 0.68 mg/dL (ref 0.44–1.00)
GFR calc Af Amer: >60 mL/min (ref >60)
GFR calc non Af Amer: >60 mL/min (ref >60)
Glucose, Bld: 111 mg/dL — ABNORMAL HIGH (ref 70–99)
Potassium: 3.7 mmol/L (ref 3.5–5.1)
Sodium: 135 mmol/L (ref 135–145)
Total Bilirubin: 0.8 mg/dL (ref 0.3–1.2)
Total Protein: 7.3 g/dL (ref 6.5–8.1)

## 2019-06-08 LAB — URINALYSIS, ROUTINE W REFLEX MICROSCOPIC
Bilirubin Urine: NEGATIVE
Glucose, UA: NEGATIVE mg/dL
Hgb urine dipstick: NEGATIVE
Ketones, ur: 5 mg/dL — AB
Nitrite: POSITIVE — AB
Protein, ur: 30 mg/dL — AB
Specific Gravity, Urine: 1.02 (ref 1.005–1.030)
pH: 5 (ref 5.0–8.0)

## 2019-06-08 LAB — I-STAT BETA HCG BLOOD, ED (MC, WL, AP ONLY): I-stat hCG, quantitative: <5 m[IU]/mL (ref <5)

## 2019-06-08 LAB — LACTIC ACID, PLASMA: Lactic Acid, Venous: 0.9 mmol/L (ref 0.5–1.9)

## 2019-06-08 LAB — LIPASE, BLOOD: Lipase: 25 U/L (ref 11–51)

## 2019-06-08 MED ORDER — ONDANSETRON HCL 4 MG/2ML IJ SOLN
4.0000 mg | Freq: Once | INTRAMUSCULAR | Status: AC
  Administered 2019-06-08: 19:00:00 4 mg via INTRAVENOUS
  Filled 2019-06-08: qty 2

## 2019-06-08 MED ORDER — SODIUM CHLORIDE 0.9 % IV SOLN
1.0000 g | Freq: Once | INTRAVENOUS | Status: AC
  Administered 2019-06-08: 21:00:00 1 g via INTRAVENOUS
  Filled 2019-06-08: qty 10

## 2019-06-08 MED ORDER — MORPHINE SULFATE (PF) 4 MG/ML IV SOLN
4.0000 mg | Freq: Once | INTRAVENOUS | Status: AC
  Administered 2019-06-08: 19:00:00 4 mg via INTRAVENOUS
  Filled 2019-06-08: qty 1

## 2019-06-08 MED ORDER — HYDROCODONE-ACETAMINOPHEN 5-325 MG PO TABS: 1.0000 | ORAL_TABLET | Freq: Four times a day (QID) | ORAL | 0 refills | Status: DC | PRN

## 2019-06-08 MED ORDER — HYDROMORPHONE HCL 1 MG/ML IJ SOLN
1.0000 mg | Freq: Once | INTRAMUSCULAR | Status: AC
  Administered 2019-06-08: 1 mg via INTRAVENOUS
  Filled 2019-06-08: qty 1

## 2019-06-08 MED ORDER — SODIUM CHLORIDE 0.9 % IV BOLUS
1000.0000 mL | Freq: Once | INTRAVENOUS | Status: AC
  Administered 2019-06-08: 19:00:00 1000 mL via INTRAVENOUS

## 2019-06-08 MED ORDER — IOHEXOL 300 MG/ML  SOLN
100.0000 mL | Freq: Once | INTRAMUSCULAR | Status: AC | PRN
  Administered 2019-06-08: 21:00:00 100 mL via INTRAVENOUS

## 2019-06-08 MED ORDER — CEPHALEXIN 500 MG PO CAPS: 500.0000 mg | ORAL_CAPSULE | Freq: Four times a day (QID) | ORAL | 0 refills | Status: AC

## 2019-06-08 NOTE — ED Provider Notes (Signed)
[facility]-EMERGENCY DEPT Provider Note   CSN: [id number] Arrival date & time: 06/08/19  1646    History   Chief Complaint Chief Complaint  Patient presents with  . Nausea  . Abdominal Pain   HPI Alicia Dixon is a 53 y.o. female with past medical history significant for COPD, depression, bipolar, chronic pain, hypertension, hyperlipidemia who presents for evaluation of abdominal pain.  Patient states she has had diffuse lower quadrant abdominal pain x3 days.  Has also had nausea, nonbloody, nonbilious emesis as well as 6 episodes of nonbloody diarrhea over the last 48 hours.  Patient states she does have chronic back pain and states she is always in pain to her bilateral legs.  She sees Dr. Allena Katz with pain management.  Denies fever, chills, headache, chest pain, cough, hematemesis, shortness of breath, dysuria, vaginal bleeding, melena, hematochezia, concerns for STDs.  States she uses a wheelchair and occasionally walker at baseline secondary to her chronic pain.  Denies described rating alleviating factors.  She has not taken anything for pain.  She states she does feel anxious being in the hospital.  Did not take anything for anxiety at home.  He has been without her home chronic pain medicine because she has not followed up with pain clinic.  History obtained from patient and past medical records.  Interpreter is used.     HPI  Past Medical History:  Diagnosis Date  . Asthma   . Bipolar disorder (HCC)   . COPD (chronic obstructive pulmonary disease) (HCC)   . Depression   . Stroke [facility])     Patient Active Problem List   Diagnosis Date Noted  . Chronic pain syndrome 07/31/2018  . Chronic right-sided low back pain without sciatica 05/28/2018  . Myalgia 05/09/2018  . COPD with acute exacerbation (HCC) 07/31/2017  . COPD (chronic obstructive pulmonary disease) (HCC) 05/22/2017  . Pneumonia 01/17/2017  . Dysphagia 08/28/2016  . GERD (gastroesophageal  reflux disease) 08/28/2016  . HCV antibody positive 08/28/2016  . Shortness of breath 04/16/2016  . Acute on chronic respiratory failure with hypoxia (HCC) 12/28/2015  . Bipolar 1 disorder, mixed, moderate (HCC) 12/28/2015  . Personal history of stroke with current residual effects 12/28/2015  . Essential hypertension 12/28/2015  . Hyperlipidemia, mixed 12/28/2015  . COPD exacerbation (HCC) 12/26/2015    Past Surgical History:  Procedure Laterality Date  . ABDOMINAL HYSTERECTOMY    . APPENDECTOMY    . CESAREAN SECTION    . CHOLECYSTECTOMY    . LUNG LOBECTOMY     right lung      OB History   No obstetric history on file.      Home Medications    Prior to Admission medications   Medication Sig Start Date End Date Taking? Authorizing Provider  ALPRAZolam Prudy Feeler) 1 MG tablet Take 1 mg by mouth 4 (four) times daily as needed for anxiety.  12/10/15  Yes [provider]  aspirin EC 81 MG tablet Take 81 mg by mouth daily.   Yes [provider]  atorvastatin (LIPITOR) 20 MG tablet Take 20 mg by mouth at bedtime. 11/28/15  Yes [provider]  cetirizine (ZYRTEC) 10 MG tablet Take 10 mg by mouth daily.   Yes [provider]  doxepin (SINEQUAN) 75 MG capsule take 1 capsule at bedtime 09/22/16  Yes [provider]  DULERA 200-5 MCG/ACT AERO INHALE TWO PUFFS BY MOUTH TWICE DAILY 10/10/16  Yes [provider]  gabapentin (NEURONTIN) 300 MG  capsule Take 900 mg by mouth at bedtime. 12/27/18  Yes [provider]  lamoTRIgine (LAMICTAL) 150 MG tablet Take 300 mg by mouth at bedtime. 12/13/15  Yes [provider]  meloxicam (MOBIC) 15 MG tablet TAKE 1 TABLET(15 MG) BY MOUTH DAILY 05/28/19  Yes Marcello FennelPatel, Ankit Anil, MD  mirtazapine (REMERON) 45 MG tablet Take 45 mg by mouth at bedtime. 12/13/15  Yes [provider]  montelukast (SINGULAIR) 10 MG tablet Take 10 mg by mouth every evening. 11/29/15  Yes [provider]   pantoprazole (PROTONIX) 40 MG tablet Take 1 tablet by mouth every morning.  11/09/16  Yes [provider]  perphenazine (TRILAFON) 8 MG tablet Take 32 mg by mouth at bedtime.  12/13/15  Yes [provider]  potassium chloride (K-DUR) 10 MEQ tablet Take 1 tablet (10 mEq total) by mouth daily. 04/17/19  Yes Sharyn CreamerQuale, Mark, MD  predniSONE (DELTASONE) 10 MG tablet Take 10 mg by mouth daily.  11/07/15  Yes [provider]  PROAIR HFA 108 (90 Base) MCG/ACT inhaler Inhale 2 puffs into the lungs every 4 (four) hours as needed. Shortness of breath 12/09/15  Yes [provider]  propranolol (INDERAL) 10 MG tablet Take 1 tablet by mouth daily.  04/04/17  Yes [provider]  SAPHRIS 10 MG SUBL Place 20 mg under the tongue every evening.  11/29/15  Yes [provider]  tiZANidine (ZANAFLEX) 4 MG tablet TAKE 1 TABLET BY MOUTH EVERY 8 HOURS AS NEEDED FOR MUSCLE SPASMS 05/28/19  Yes Marcello FennelPatel, Ankit Anil, MD  cephALEXin (KEFLEX) 500 MG capsule Take 1 capsule (500 mg total) by mouth 4 (four) times daily for 5 days. 06/08/19 06/13/19  Arville Postlewaite A, PA-C  gabapentin (NEURONTIN) 100 MG capsule Take 1 capsule (100 mg total) by mouth 3 (three) times daily as needed (right lower back and leg pain). Patient not taking: Reported on 06/08/2019 04/17/19   Sharyn CreamerQuale, Mark, MD  HYDROcodone-acetaminophen (NORCO/VICODIN) 5-325 MG tablet Take 1 tablet by mouth every 6 (six) hours as needed for severe pain. 06/08/19   Aliviyah Malanga A, PA-C  ipratropium-albuterol (DUONEB) 0.5-2.5 (3) MG/3ML SOLN Take 3 mLs by nebulization 4 (four) times daily. Patient not taking: Reported on 06/08/2019 04/19/17   Kari BaarsHawkins, Edward, MD    Family History Family History  Problem Relation Age of Onset  . Colon cancer Maternal Aunt     Social History Social History   Tobacco Use  . Smoking status: Current Every Day Smoker    Packs/day: 0.25    Years: 35.00    Pack years: 8.75    Types: Cigarettes  .  Smokeless tobacco: Never Used  Substance Use Topics  . Alcohol use: No  . Drug use: No     Allergies   Other, Baclofen, Flonase  [fluticasone propionate], Gadopentetate dimeglumine, Lortab [hydrocodone-acetaminophen], Seroquel [quetiapine fumarate], Sudafed [pseudoephedrine hcl], Tramadol, and Trazodone and nefazodone   Review of Systems Review of Systems  Constitutional: Negative.   HENT: Negative.   Respiratory: Negative.   Cardiovascular: Negative.   Gastrointestinal: Positive for abdominal pain, diarrhea, nausea and vomiting. Negative for abdominal distention, anal bleeding, blood in stool, constipation and rectal pain.  Genitourinary: Negative.   Musculoskeletal: Positive for back pain (chronic) and gait problem (wheelchair and walker at baseline).  All other systems reviewed and are negative.    Physical Exam Updated Vital Signs BP (!) 151/101   Pulse (!) 110   Temp 98.3 F (36.8 C) (Axillary) Comment (Src): actively vomiting  Resp 15   Ht 5\' 7"  (1.702 m)   SpO2 96%   BMI 25.69 kg/m   Physical Exam Vitals signs and nursing note reviewed.  Constitutional:      General: She is not in acute distress.    Appearance: She is well-developed. She is not ill-appearing, toxic-appearing or diaphoretic.  HENT:     Head: Normocephalic and atraumatic.     Mouth/Throat:     Mouth: Mucous membranes are moist.     Pharynx: Oropharynx is clear.  Eyes:     Pupils: Pupils are equal, round, and reactive to light.  Neck:     Musculoskeletal: Normal range of motion.  Cardiovascular:     Rate and Rhythm: Normal rate.     Heart sounds: Normal heart sounds.  Pulmonary:     Effort: Pulmonary effort is normal. No respiratory distress.     Breath sounds: Normal breath sounds.  Abdominal:     General: Bowel sounds are normal. There is no distension.     Palpations: Abdomen is soft.     Tenderness: There is abdominal tenderness in the right lower quadrant, suprapubic area and left  lower quadrant. There is no right CVA tenderness, left CVA tenderness, guarding or rebound. Negative signs include psoas sign and obturator sign.     Hernia: No hernia is present.     Comments: Soft, without rebound or guarding.  Generalized tenderness to bilateral lower quadrant suprapubic region.  Negative psoas, obturator sign.  No evidence of abdominal wall herniations.  Normoactive bowel sounds.  No abdominal skin changes.  Musculoskeletal: Normal range of motion.     Comments: Full range of motion of the T-spine and L-spine with flexion, hyperextension, and lateral flexion. No midline tenderness or stepoffs. No tenderness to palpation of the spinous processes of the T-spine or L-spine. Mild tenderness to palpation of the paraspinous muscles of the L-spine.  Skin:    General: Skin is warm and dry.  Neurological:     Mental Status: She is alert.     Comments: Speech is clear and goal oriented, follows commands Normal 4+/5 strength in upper and lower extremities bilaterally including dorsiflexion and plantar flexion, strong and equal grip strength Sensation normal to light and sharp touch Moves extremities without ataxia, coordination intact      ED Treatments / Results  Labs (all labs ordered are listed, but only abnormal results are displayed) Labs Reviewed  COMPREHENSIVE METABOLIC PANEL - Abnormal; Notable for the following components:      Result Value   Glucose, Bld 111 (*)    All other components within normal limits  CBC - Abnormal; Notable for the following components:   WBC 15.1 (*)    Hemoglobin 11.3 (*)    MCH 25.4 (*)    RDW 17.6 (*)    Platelets 512 (*)    All other components within normal limits  URINALYSIS, ROUTINE W REFLEX MICROSCOPIC - Abnormal; Notable for the following components:   APPearance CLOUDY (*)    Ketones, ur 5 (*)    Protein, ur 30 (*)    Nitrite POSITIVE (*)    Leukocytes,Ua SMALL (*)    Bacteria, UA MANY (*)    All other components within  normal limits  CULTURE, BLOOD (ROUTINE X 2)  CULTURE, BLOOD (ROUTINE X 2)  URINE CULTURE  LIPASE, BLOOD  LACTIC ACID, PLASMA  LACTIC ACID, PLASMA  I-STAT BETA HCG BLOOD, ED ([facility], [facility], [facility] ONLY)    EKG None  Radiology  Ct Abdomen Pelvis W Contrast  Result Date: 06/08/2019 CLINICAL DATA:  Pt arrived via [facility] from home. C/O NVD X 3 days and lower abd pain. Per EMS Zofran 4 mg and 350 NSIV given in route without relief. 18G right AC. Pt states she has a" building disk and a pinched nerve in her back making her legs ache 10/10 pain. EXAM: CT ABDOMEN AND PELVIS WITH CONTRAST TECHNIQUE: Multidetector CT imaging of the abdomen and pelvis was performed using the standard protocol following bolus administration of intravenous contrast. CONTRAST:  172mL OMNIPAQUE IOHEXOL 300 MG/ML  SOLN COMPARISON:  04/17/2019 FINDINGS: Lower chest: Minimal bibasilar atelectasis. Heart size is normal. Hepatobiliary: Diffuse low-attenuation of the liver. No focal liver lesions. Cholecystectomy. Mildly ectatic common bile duct is stable. Pancreas: Unremarkable. No pancreatic ductal dilatation or surrounding inflammatory changes. Spleen: Normal in size without focal abnormality. Adrenals/Urinary Tract: Adrenal glands are normal in appearance. Kidneys are symmetric. No hydronephrosis. No renal mass. The bladder and visualized portion of the urethra are normal. Stomach/Bowel: Stomach is normal in appearance. Small bowel loops are not dilated. Appendectomy. Mobile cecum, in the LEFT mid abdomen. Vascular/Lymphatic: Minimal atherosclerotic calcification of the abdominal aorta. There is normal vascular opacification of the celiac axis, superior mesenteric artery, and inferior mesenteric artery. Normal appearance of the portal venous system and inferior vena cava. No retroperitoneal or mesenteric adenopathy. Reproductive: Hysterectomy. No adnexal mass. Other: No abdominal wall hernia or abnormality. No abdominopelvic ascites.  Musculoskeletal: No acute or significant osseous findings. No significant degenerative change in the lumbar spine. Scattered bone islands within the pelvis. IMPRESSION: 1. No evidence for acute abnormality of the abdomen or pelvis. 2. Hepatic steatosis. 3. Status post cholecystectomy, appendectomy, hysterectomy. 4. Aortic Atherosclerosis (ICD10-I70.0). Electronically Signed   By: Nolon Nations M.D.   On: 06/08/2019 21:14    Procedures Procedures (including critical care time)  Medications Ordered in ED Medications  sodium chloride 0.9 % bolus 1,000 mL (0 mLs Intravenous Stopped 06/08/19 2024)  ondansetron (ZOFRAN) injection 4 mg (4 mg Intravenous Given 06/08/19 1905)  morphine 4 MG/ML injection 4 mg (4 mg Intravenous Given 06/08/19 1906)  cefTRIAXone (ROCEPHIN) 1 g in sodium chloride 0.9 % 100 mL IVPB (0 g Intravenous Stopped 06/08/19 2201)  iohexol (OMNIPAQUE) 300 MG/ML solution 100 mL (100 mLs Intravenous Contrast Given 06/08/19 2031)  HYDROmorphone (DILAUDID) injection 1 mg (1 mg Intravenous Given 06/08/19 2224)   Initial Impression / Assessment and Plan / ED Course  I have reviewed the triage vital signs and the nursing notes.  Pertinent labs & imaging results that were available during my care of the patient were reviewed by me and considered in my medical decision making (see chart for details).  53 year old female who is otherwise well presents for evaluation of lower abdominal pain.  Afebrile, nonseptic, non-ill-appearing.  Abdomen soft with mild tenderness to suprapubic region.  No rebound or guarding.  Nonsurgical abdomen.  Heart and lungs clear.  She is intermittently tachycardic.  Denies any upper respiratory symptoms. Tolerating PO intake without difficulty.  Has history of chronic pain and she is unable to tell me if this is her chronic pain or knee pain.  Has had some diarrhea without melena hematochezia.  No recent travel or antibiotic use.  No known COVID exposures.  No pelvic pain,  vaginal discharge, no concerns for STDs.  Labs and imaging personally reviewed. CBC with leukocytosis at 15.1 however appears at patient's baseline Blood panel without electrolyte, renal or liver abnormality Pregnancy test negative Lipase  25 Lactic acid 0.9 Blood cultures obtained given tachycardia. Urinalysis positive infection. CT AP without acute findings.  Reevaluation patient with improvement in tachycardia with fluids.  She has been out of her home narcotics from her pain management specialist due to her not having an appointment with him.  She has been able to tolerate p.o. intake in ED without difficulty.  Patient given IV Rocephin in ED.  Will DC home with Keflex.  Shared decision making given requiring pain medicine, leukocytosis and tachycardia.  Trial of home therapy.  I feel this is reasonable given she appears over all well and is tolerating p.o.  Follow-up with PCP.  Discussed strict return precautions.  Patient voiced understanding and is agreeable for follow up.   Patient is nontoxic, nonseptic appearing, in no apparent distress. Patient does not meet the SIRS or Sepsis criteria.  On repeat exam patient does not have a surgical abdomin and there are no peritoneal signs.  No indication of appendicitis, bowel obstruction, bowel perforation, cholecystitis, diverticulitis, PID, TOA, torsion or ectopic pregnancy.  Patient discharged home with symptomatic treatment and given strict instructions for follow-up with their primary care physician.  I have also discussed reasons to return immediately to the ER.  Patient expresses understanding and agrees with plan.   The patient has been appropriately medically screened and/or stabilized in the ED. I have low suspicion for any other emergent medical condition which would require further screening, evaluation or treatment in the ED or require inpatient management.      Final Clinical Impressions(s) / ED Diagnoses   Final diagnoses:   Cystitis    ED Discharge Orders         Ordered    HYDROcodone-acetaminophen (NORCO/VICODIN) 5-325 MG tablet  Every 6 hours PRN     06/08/19 2330    cephALEXin (KEFLEX) 500 MG capsule  4 times daily     06/08/19 2330           Karys Meckley A, PA-C 06/08/19 2336    Loren RacerYelverton, David, MD 06/12/19 [phone]

## 2019-06-08 NOTE — Discharge Instructions (Signed)
Take the medications as prescribed. Follow up with PCP for reevaluation in 2 days. Return for new or worsening symptoms.

## 2019-06-08 NOTE — ED Triage Notes (Addendum)
Pt arrived via [facility] from home. C/O NVD X 3 days and lower abd pain. Per EMS Zofran 4 mg and 350 NSIV given in route without relief. 18G right AC.  Pt states she has a" building disk and a pinched nerve in her back making her legs ache 10/10 pain"

## 2019-06-09 ENCOUNTER — Encounter: Payer: Medicare Other | Attending: Physical Medicine & Rehabilitation | Admitting: Physical Medicine & Rehabilitation

## 2019-06-09 ENCOUNTER — Encounter

## 2019-06-10 LAB — URINE CULTURE: Culture: 90000 — AB

## 2019-06-11 ENCOUNTER — Telehealth: Payer: Self-pay | Admitting: *Deleted

## 2019-06-11 NOTE — Telephone Encounter (Signed)
Post ED Visit - Positive Culture Follow-up  Culture report reviewed by antimicrobial stewardship pharmacist: [place] Team []  Elenor Quinones, Pharm.D. []  Heide Guile, Pharm.D., BCPS AQ-ID []  Parks Neptune, Pharm.D., BCPS []  Alycia Rossetti, Pharm.D., BCPS []  [place], Pharm.D., BCPS, AAHIVP []  Legrand Como, Pharm.D., BCPS, AAHIVP []  Salome Arnt, PharmD, BCPS []  Johnnette Gourd, PharmD, BCPS []  Hughes Better, PharmD, BCPS []  Leeroy Cha, PharmD []  Laqueta Linden, PharmD, BCPS []  Albertina Parr, PharmD  [place] Team []  Leodis Sias, PharmD []  Lindell Spar, PharmD []  Royetta Asal, PharmD []  Graylin Shiver, Rph []  Rema Fendt) Glennon Mac, PharmD []  Arlyn Dunning, PharmD []  Netta Cedars, PharmD [x]  Dia Sitter, PharmD []  Leone Haven, PharmD []  Gretta Arab, PharmD []  Theodis Shove, PharmD []  Peggyann Juba, PharmD []  Reuel Boom, PharmD   Positive urine culture Treated with Cephalexin, organism sensitive to the same and no further patient follow-up is required at this time.  Harlon Flor [facility] 06/11/2019, 3:08 PM

## 2019-06-12 DIAGNOSIS — J449 Chronic obstructive pulmonary disease, unspecified: Secondary | ICD-10-CM | POA: Diagnosis not present

## 2019-06-12 DIAGNOSIS — M545 Low back pain: Secondary | ICD-10-CM | POA: Diagnosis not present

## 2019-06-12 DIAGNOSIS — R269 Unspecified abnormalities of gait and mobility: Secondary | ICD-10-CM | POA: Diagnosis not present

## 2019-06-12 DIAGNOSIS — M62412 Contracture of muscle, left shoulder: Secondary | ICD-10-CM | POA: Diagnosis not present

## 2019-06-14 LAB — CULTURE, BLOOD (ROUTINE X 2)
Culture: NO GROWTH
Culture: NO GROWTH
Special Requests: ADEQUATE
Special Requests: ADEQUATE

## 2019-06-17 DIAGNOSIS — J9611 Chronic respiratory failure with hypoxia: Secondary | ICD-10-CM | POA: Diagnosis not present

## 2019-06-17 DIAGNOSIS — J449 Chronic obstructive pulmonary disease, unspecified: Secondary | ICD-10-CM | POA: Diagnosis not present

## 2019-06-23 DIAGNOSIS — J449 Chronic obstructive pulmonary disease, unspecified: Secondary | ICD-10-CM | POA: Diagnosis not present

## 2019-06-23 DIAGNOSIS — J9611 Chronic respiratory failure with hypoxia: Secondary | ICD-10-CM | POA: Diagnosis not present

## 2019-06-24 ENCOUNTER — Encounter: Payer: Medicare Other | Admitting: Physical Medicine & Rehabilitation

## 2019-06-26 ENCOUNTER — Telehealth: Payer: Self-pay | Admitting: *Deleted

## 2019-06-26 NOTE — Telephone Encounter (Signed)
Alicia Dixon called and is out or her gabapentin and is asking for a refill to get to her appointment 07/23/19 with Dr Posey Pronto. I have reinforced she must keep that appt or no further medications will be refilled from this office. She is having a great deal of leg pain and has been out of her gabapentin and promises to keep the October appointment. In a phone message 04/24/19 you said:  Jamse Arn, MD to [facility] Pool     11:56 AM Note   She can increase to 300 TID, this will be more effective for neuropathic pain.     Please advise if ok to refill

## 2019-06-27 MED ORDER — GABAPENTIN 100 MG PO CAPS: 300.0000 mg | ORAL_CAPSULE | Freq: Three times a day (TID) | ORAL | 0 refills | Status: DC

## 2019-06-27 NOTE — Telephone Encounter (Signed)
Yes, we can refill to her appointment date only.  Thanks.

## 2019-06-27 NOTE — Telephone Encounter (Signed)
As previously noted, she has missed/cancelled several appointment, including one again 1-2 weeks ago.

## 2019-06-29 NOTE — Telephone Encounter (Signed)
Medication ordered by Wenda Overland, RN

## 2019-07-01 ENCOUNTER — Telehealth: Payer: Self-pay | Admitting: Physical Medicine & Rehabilitation

## 2019-07-01 MED ORDER — GABAPENTIN 100 MG PO CAPS: 300.0000 mg | ORAL_CAPSULE | Freq: Three times a day (TID) | ORAL | 0 refills | Status: DC

## 2019-07-01 NOTE — Telephone Encounter (Signed)
Patient is needing her [facility] to [facility] [address] in [place], phone number [phone].  She isn't using the [place] anymore and needs this transferred to [place].  Any questions please call patient.

## 2019-07-01 NOTE — Telephone Encounter (Signed)
[facility] [place] removed. [place]or[place] notified

## 2019-07-06 DIAGNOSIS — M549 Dorsalgia, unspecified: Secondary | ICD-10-CM | POA: Diagnosis not present

## 2019-07-06 DIAGNOSIS — J449 Chronic obstructive pulmonary disease, unspecified: Secondary | ICD-10-CM | POA: Diagnosis not present

## 2019-07-12 DIAGNOSIS — M545 Low back pain: Secondary | ICD-10-CM | POA: Diagnosis not present

## 2019-07-12 DIAGNOSIS — R269 Unspecified abnormalities of gait and mobility: Secondary | ICD-10-CM | POA: Diagnosis not present

## 2019-07-12 DIAGNOSIS — M62412 Contracture of muscle, left shoulder: Secondary | ICD-10-CM | POA: Diagnosis not present

## 2019-07-12 DIAGNOSIS — J449 Chronic obstructive pulmonary disease, unspecified: Secondary | ICD-10-CM | POA: Diagnosis not present

## 2019-07-13 DIAGNOSIS — I10 Essential (primary) hypertension: Secondary | ICD-10-CM | POA: Diagnosis not present

## 2019-07-13 DIAGNOSIS — R197 Diarrhea, unspecified: Secondary | ICD-10-CM | POA: Diagnosis not present

## 2019-07-14 ENCOUNTER — Encounter: Payer: Medicare Other | Attending: Physical Medicine & Rehabilitation | Admitting: Physical Medicine & Rehabilitation

## 2019-07-14 ENCOUNTER — Encounter: Payer: Self-pay | Admitting: Physical Medicine & Rehabilitation

## 2019-07-14 ENCOUNTER — Other Ambulatory Visit: Payer: Self-pay

## 2019-07-14 VITALS — BP 104/71 | HR 130 | Temp 98.5°F | Ht 68.0 in | Wt 143.8 lb

## 2019-07-14 DIAGNOSIS — M545 Low back pain: Secondary | ICD-10-CM | POA: Diagnosis not present

## 2019-07-14 DIAGNOSIS — G894 Chronic pain syndrome: Secondary | ICD-10-CM | POA: Insufficient documentation

## 2019-07-14 DIAGNOSIS — M791 Myalgia, unspecified site: Secondary | ICD-10-CM | POA: Insufficient documentation

## 2019-07-14 DIAGNOSIS — Z720 Tobacco use: Secondary | ICD-10-CM

## 2019-07-14 DIAGNOSIS — R197 Diarrhea, unspecified: Secondary | ICD-10-CM | POA: Diagnosis not present

## 2019-07-14 DIAGNOSIS — G8929 Other chronic pain: Secondary | ICD-10-CM

## 2019-07-14 DIAGNOSIS — Z7689 Persons encountering health services in other specified circumstances: Secondary | ICD-10-CM | POA: Diagnosis not present

## 2019-07-14 DIAGNOSIS — R269 Unspecified abnormalities of gait and mobility: Secondary | ICD-10-CM | POA: Insufficient documentation

## 2019-07-14 DIAGNOSIS — G479 Sleep disorder, unspecified: Secondary | ICD-10-CM

## 2019-07-14 MED ORDER — GABAPENTIN 100 MG PO CAPS: 600.0000 mg | ORAL_CAPSULE | Freq: Three times a day (TID) | ORAL | 0 refills | Status: DC

## 2019-07-14 MED ORDER — METHYLPREDNISOLONE 4 MG PO TBPK: ORAL_TABLET | ORAL | 0 refills | Status: DC
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
## 2019-07-15 ENCOUNTER — Ambulatory Visit: Payer: Medicare Other | Admitting: Physical Medicine & Rehabilitation

## 2019-07-15 DIAGNOSIS — Z7689 Persons encountering health services in other specified circumstances: Secondary | ICD-10-CM | POA: Diagnosis not present

## 2019-07-15 DIAGNOSIS — R300 Dysuria: Secondary | ICD-10-CM | POA: Diagnosis not present

## 2019-07-15 DIAGNOSIS — Z23 Encounter for immunization: Secondary | ICD-10-CM | POA: Diagnosis not present

## 2019-07-15 DIAGNOSIS — E119 Type 2 diabetes mellitus without complications: Secondary | ICD-10-CM | POA: Diagnosis not present

## 2019-07-15 DIAGNOSIS — E785 Hyperlipidemia, unspecified: Secondary | ICD-10-CM | POA: Diagnosis not present

## 2019-07-17 DIAGNOSIS — Z7689 Persons encountering health services in other specified circumstances: Secondary | ICD-10-CM | POA: Diagnosis not present

## 2019-07-17 DIAGNOSIS — J449 Chronic obstructive pulmonary disease, unspecified: Secondary | ICD-10-CM | POA: Diagnosis not present

## 2019-07-17 DIAGNOSIS — J9611 Chronic respiratory failure with hypoxia: Secondary | ICD-10-CM | POA: Diagnosis not present

## 2019-07-19 ENCOUNTER — Other Ambulatory Visit: Payer: Self-pay | Admitting: Physical Medicine & Rehabilitation

## 2019-07-23 ENCOUNTER — Ambulatory Visit: Payer: Medicare Other | Admitting: Physical Medicine & Rehabilitation

## 2019-07-23 DIAGNOSIS — Z7689 Persons encountering health services in other specified circumstances: Secondary | ICD-10-CM | POA: Diagnosis not present

## 2019-07-23 DIAGNOSIS — R202 Paresthesia of skin: Secondary | ICD-10-CM | POA: Diagnosis not present

## 2019-07-23 DIAGNOSIS — D649 Anemia, unspecified: Secondary | ICD-10-CM | POA: Diagnosis not present

## 2019-07-23 DIAGNOSIS — J9611 Chronic respiratory failure with hypoxia: Secondary | ICD-10-CM | POA: Diagnosis not present

## 2019-07-23 DIAGNOSIS — G894 Chronic pain syndrome: Secondary | ICD-10-CM | POA: Diagnosis not present

## 2019-07-23 DIAGNOSIS — M7989 Other specified soft tissue disorders: Secondary | ICD-10-CM | POA: Diagnosis not present

## 2019-07-23 DIAGNOSIS — J449 Chronic obstructive pulmonary disease, unspecified: Secondary | ICD-10-CM | POA: Diagnosis not present

## 2019-07-27 DIAGNOSIS — E539 Vitamin B deficiency, unspecified: Secondary | ICD-10-CM | POA: Diagnosis not present

## 2019-07-27 DIAGNOSIS — Z7689 Persons encountering health services in other specified circumstances: Secondary | ICD-10-CM | POA: Diagnosis not present

## 2019-07-29 ENCOUNTER — Other Ambulatory Visit: Payer: Self-pay

## 2019-07-29 NOTE — Patient Outreach (Signed)
[facility]) Care Management  07/29/2019  Alicia Dixon 1965-10-23 [id number]   Medication Adherence call to Alicia Dixon [facility] Identifiers Verify spoke with patient she is past due on Atorvastatin 20 mg patient explain she was going thru some economics issues that she has resolve and is now living at an assistance living and  taking track of all her medications,patient has medication at this time and using a new pharmacy .Alicia Dixon is showing past due under [place].   [place] Management Direct Dial [phone]  Fax [phone] [email or web address]

## 2019-07-31 DIAGNOSIS — H5213 Myopia, bilateral: Secondary | ICD-10-CM | POA: Diagnosis not present

## 2019-07-31 DIAGNOSIS — Z7689 Persons encountering health services in other specified circumstances: Secondary | ICD-10-CM | POA: Diagnosis not present

## 2019-07-31 DIAGNOSIS — H524 Presbyopia: Secondary | ICD-10-CM | POA: Diagnosis not present

## 2019-07-31 DIAGNOSIS — H25813 Combined forms of age-related cataract, bilateral: Secondary | ICD-10-CM | POA: Diagnosis not present

## 2019-07-31 DIAGNOSIS — H04123 Dry eye syndrome of bilateral lacrimal glands: Secondary | ICD-10-CM | POA: Diagnosis not present

## 2019-08-03 DIAGNOSIS — M4716 Other spondylosis with myelopathy, lumbar region: Secondary | ICD-10-CM | POA: Diagnosis not present

## 2019-08-03 DIAGNOSIS — M545 Low back pain: Secondary | ICD-10-CM | POA: Diagnosis not present

## 2019-08-03 DIAGNOSIS — Z7689 Persons encountering health services in other specified circumstances: Secondary | ICD-10-CM | POA: Diagnosis not present

## 2019-08-03 DIAGNOSIS — Z5320 Procedure and treatment not carried out because of patient's decision for unspecified reasons: Secondary | ICD-10-CM | POA: Diagnosis not present

## 2019-08-04 DIAGNOSIS — Z7689 Persons encountering health services in other specified circumstances: Secondary | ICD-10-CM | POA: Diagnosis not present

## 2019-08-05 DIAGNOSIS — H548 Legal blindness, as defined in USA: Secondary | ICD-10-CM | POA: Diagnosis not present

## 2019-08-05 DIAGNOSIS — R69 Illness, unspecified: Secondary | ICD-10-CM | POA: Diagnosis not present

## 2019-08-05 DIAGNOSIS — E538 Deficiency of other specified B group vitamins: Secondary | ICD-10-CM | POA: Diagnosis not present

## 2019-08-10 ENCOUNTER — Encounter: Payer: Medicare Other | Admitting: Physical Medicine & Rehabilitation

## 2019-08-25 ENCOUNTER — Other Ambulatory Visit: Payer: Self-pay | Admitting: Physical Medicine & Rehabilitation

## 2019-09-18 ENCOUNTER — Other Ambulatory Visit: Payer: Self-pay | Admitting: Physical Medicine & Rehabilitation

## 2019-09-18 NOTE — Telephone Encounter (Signed)
Recieved electronic medication refill request for a

## 2021-07-24 DIAGNOSIS — E538 Deficiency of other specified B group vitamins: Secondary | ICD-10-CM | POA: Insufficient documentation

## 2021-07-24 DIAGNOSIS — F17200 Nicotine dependence, unspecified, uncomplicated: Secondary | ICD-10-CM | POA: Insufficient documentation

## 2021-08-01 DIAGNOSIS — G600 Hereditary motor and sensory neuropathy: Secondary | ICD-10-CM | POA: Insufficient documentation

## 2021-12-18 DIAGNOSIS — F419 Anxiety disorder, unspecified: Secondary | ICD-10-CM | POA: Insufficient documentation

## 2021-12-18 DIAGNOSIS — J45909 Unspecified asthma, uncomplicated: Secondary | ICD-10-CM | POA: Insufficient documentation

## 2021-12-18 DIAGNOSIS — Z9289 Personal history of other medical treatment: Secondary | ICD-10-CM | POA: Insufficient documentation

## 2021-12-18 DIAGNOSIS — C801 Malignant (primary) neoplasm, unspecified: Secondary | ICD-10-CM | POA: Insufficient documentation

## 2021-12-28 DIAGNOSIS — F209 Schizophrenia, unspecified: Secondary | ICD-10-CM | POA: Insufficient documentation

## 2022-03-01 DIAGNOSIS — R319 Hematuria, unspecified: Secondary | ICD-10-CM | POA: Insufficient documentation

## 2022-03-01 DIAGNOSIS — K121 Other forms of stomatitis: Secondary | ICD-10-CM | POA: Insufficient documentation

## 2022-04-02 DIAGNOSIS — Z8673 Personal history of transient ischemic attack (TIA), and cerebral infarction without residual deficits: Secondary | ICD-10-CM | POA: Insufficient documentation

## 2022-04-02 DIAGNOSIS — G43909 Migraine, unspecified, not intractable, without status migrainosus: Secondary | ICD-10-CM | POA: Insufficient documentation

## 2022-04-02 DIAGNOSIS — M419 Scoliosis, unspecified: Secondary | ICD-10-CM | POA: Insufficient documentation

## 2022-04-02 DIAGNOSIS — G629 Polyneuropathy, unspecified: Secondary | ICD-10-CM | POA: Insufficient documentation

## 2022-05-12 DIAGNOSIS — R7989 Other specified abnormal findings of blood chemistry: Secondary | ICD-10-CM | POA: Insufficient documentation

## 2022-05-12 DIAGNOSIS — R4182 Altered mental status, unspecified: Secondary | ICD-10-CM | POA: Insufficient documentation

## 2022-05-12 DIAGNOSIS — M069 Rheumatoid arthritis, unspecified: Secondary | ICD-10-CM | POA: Insufficient documentation

## 2022-05-12 DIAGNOSIS — E875 Hyperkalemia: Secondary | ICD-10-CM | POA: Insufficient documentation

## 2023-05-15 DIAGNOSIS — G609 Hereditary and idiopathic neuropathy, unspecified: Secondary | ICD-10-CM | POA: Insufficient documentation

## 2023-05-15 DIAGNOSIS — G2401 Drug induced subacute dyskinesia: Secondary | ICD-10-CM | POA: Insufficient documentation

## 2023-11-11 DIAGNOSIS — R072 Precordial pain: Secondary | ICD-10-CM | POA: Insufficient documentation

## 2024-07-02 ENCOUNTER — Ambulatory Visit: Payer: Self-pay | Attending: Cardiology | Admitting: Cardiology

## 2024-07-02 ENCOUNTER — Encounter: Payer: Self-pay | Admitting: Cardiology

## 2024-07-02 VITALS — BP 90/70 | HR 72 | Ht 68.0 in | Wt 199.0 lb

## 2024-07-02 DIAGNOSIS — E782 Mixed hyperlipidemia: Secondary | ICD-10-CM

## 2024-07-02 DIAGNOSIS — G8918 Other acute postprocedural pain: Secondary | ICD-10-CM | POA: Insufficient documentation

## 2024-07-02 DIAGNOSIS — I10 Essential (primary) hypertension: Secondary | ICD-10-CM | POA: Diagnosis not present

## 2024-07-02 DIAGNOSIS — I5032 Chronic diastolic (congestive) heart failure: Secondary | ICD-10-CM

## 2024-07-02 DIAGNOSIS — K219 Gastro-esophageal reflux disease without esophagitis: Secondary | ICD-10-CM

## 2024-07-02 DIAGNOSIS — J410 Simple chronic bronchitis: Secondary | ICD-10-CM

## 2024-07-02 DIAGNOSIS — Z8673 Personal history of transient ischemic attack (TIA), and cerebral infarction without residual deficits: Secondary | ICD-10-CM

## 2024-07-02 DIAGNOSIS — R0602 Shortness of breath: Secondary | ICD-10-CM

## 2024-07-02 DIAGNOSIS — M069 Rheumatoid arthritis, unspecified: Secondary | ICD-10-CM

## 2024-07-02 DIAGNOSIS — I5030 Unspecified diastolic (congestive) heart failure: Secondary | ICD-10-CM | POA: Insufficient documentation

## 2024-07-02 MED ORDER — DAPAGLIFLOZIN PROPANEDIOL 10 MG PO TABS: 10.0000 mg | ORAL_TABLET | Freq: Every day | ORAL | 3 refills | Status: AC

## 2024-07-02 MED ORDER — DAPAGLIFLOZIN PROPANEDIOL 10 MG PO TABS: 10.0000 mg | ORAL_TABLET | Freq: Every day | ORAL | Status: AC

## 2024-07-02 NOTE — Patient Instructions (Addendum)
 Medication Instructions:   START: Farxiga  10mg  1 tablet daily   Lab Work: Lipid, AST, ALT, HgbA1C, ProBNP- today If you have labs (blood work) drawn today and your tests are completely normal, you will receive your results only by: MyChart Message (if you have MyChart) OR A paper copy in the mail If you have any lab test that is abnormal or we need to change your treatment, we will call you to review the results.   Testing/Procedures: Your physician has requested that you have an echocardiogram. Echocardiography is a painless test that uses sound waves to create images of your heart. It provides your doctor with information about the size and shape of your heart and how well your heart's chambers and valves are working. This procedure takes approximately one hour. There are no restrictions for this procedure. Please do NOT wear cologne, perfume, aftershave, or lotions (deodorant is allowed). Please arrive 15 minutes prior to your appointment time.  Please note: We ask at that you not bring children with you during ultrasound (echo/ vascular) testing. Due to room size and safety concerns, children are not allowed in the ultrasound rooms during exams. Our front office staff cannot provide observation of children in our lobby area while testing is being conducted. An adult accompanying a patient to their appointment will only be allowed in the ultrasound room at the discretion of the ultrasound technician under special circumstances. We apologize for any inconvenience.   Your physician has requested that you have a carotid duplex. This test is an ultrasound of the carotid arteries in your neck. It looks at blood flow through these arteries that supply the brain with blood. Allow one hour for this exam. There are no restrictions or special instructions.    Follow-Up: At [facility], you and your health needs are our priority.  As part of our continuing mission to provide you with exceptional  heart care, we have created designated Provider Care Teams.  These Care Teams include your primary Cardiologist (physician) and Advanced Practice Providers (APPs -  Physician Assistants and Nurse Practitioners) who all work together to provide you with the care you need, when you need it.  We recommend signing up for the patient portal called MyChart.  Sign up information is provided on this After Visit Summary.  MyChart is used to connect with patients for Virtual Visits (Telemedicine).  Patients are able to view lab/test results, encounter notes, upcoming appointments, etc.  Non-urgent messages can be sent to your provider as well.   To learn more about what you can do with MyChart, go to [email or web address].    Your next appointment:   3 month(s)  The format for your next appointment:   In Person  Provider:   Lamar Fitch, MD    Other Instructions NA

## 2024-07-02 NOTE — Progress Notes (Signed)
 Cardiology Consultation:    Date:  07/02/2024   ID:  Alicia Dixon, DOB 17-Jul-1966, MRN [id number]  PCP:  Katrinka Aquas, MD  Cardiologist:  Lamar Fitch, MD   Referring MD: Katrinka Aquas, MD   No chief complaint on file. I would like to be established as a patient  History of Present Illness:    Alicia Dixon is a 58 y.o. female who is being seen today for the evaluation of congestive heart failure at the request of Katrinka Aquas, MD. past medical history significant for COPD, chronic smoking with sadly still ongoing, bipolar disorder with some depressions, history of CVA in 2015, recently she ended up being in a hospital because of intermittent chest pain.  Stress test was done which was negative in spite of that she kept having chest pain, eventually cardiac catheterization has been done which showed luminal disease.  Interestingly LVEDP was elevated.  She was put on diuretic and potassium since that time she seems to be doing fine.  She is on oxygen  all the time.  There is no swelling of lower extremities.  There is no paroxysmal nocturnal dyspnea.  She also tell me about the fact in 2015 she had a 2 strokes.  First time she ended up being in the hospital and does not remember anything apparently the right side was weak but then completely recovered then the same year a little later she had similar symptoms involving left side of her body.  She was noted to inform why she ended up having stroke and I am not really sure if it was just TIA or CVA.  Did happen in different state and I do not have record of it.  She is trying to be active but walking and especially when the windy weather she gets shortness of breath quite easily.  She does have rheumatoid Tritus and pain in different portion of her body.  Past Medical History:  Diagnosis Date   Asthma    Bipolar disorder (HCC)    COPD (chronic obstructive pulmonary disease) (HCC)    Depression    Stroke (HCC)     Past Surgical  History:  Procedure Laterality Date   ABDOMINAL HYSTERECTOMY     APPENDECTOMY     CESAREAN SECTION     CHOLECYSTECTOMY     LUNG LOBECTOMY     right lung     Current Medications: Current Meds  Medication Sig   ALPRAZolam  (XANAX ) 1 MG tablet Take 1 mg by mouth 4 (four) times daily as needed for anxiety.    aspirin  EC 81 MG tablet Take 81 mg by mouth daily.   atorvastatin  (LIPITOR) 20 MG tablet Take 20 mg by mouth at bedtime.   cetirizine (ZYRTEC) 10 MG tablet Take 10 mg by mouth daily.   diclofenac Sodium (VOLTAREN) 1 % GEL Apply 2 g topically 4 (four) times daily.   EPINEPHrine 0.3 mg/0.3 mL IJ SOAJ injection Inject 0.3 mg into the muscle as needed for anaphylaxis.   Fluticasone-Umeclidin-Vilant (TRELEGY ELLIPTA) 200-62.5-25 MCG/ACT AEPB Inhale 1 puff into the lungs 2 (two) times daily.   furosemide (LASIX) 40 MG tablet Take 40 mg by mouth daily.   gabapentin  (NEURONTIN ) 600 MG tablet Take 600 mg by mouth 4 (four) times daily.   INGREZZA 80 MG capsule Take 80 mg by mouth daily.   ipratropium-albuterol  (DUONEB) 0.5-2.5 (3) MG/3ML SOLN Take 3 mLs by nebulization 4 (four) times daily.   lamoTRIgine  (LAMICTAL ) 150 MG tablet Take 300 mg  by mouth at bedtime.   mirtazapine  (REMERON ) 45 MG tablet Take 45 mg by mouth at bedtime.   montelukast  (SINGULAIR ) 10 MG tablet Take 10 mg by mouth every evening.   omeprazole (PRILOSEC) 20 MG capsule Take 20 mg by mouth 2 (two) times daily.   potassium chloride  SA (KLOR-CON  M) 20 MEQ tablet Take 40 mEq by mouth 2 (two) times daily.   predniSONE  (DELTASONE ) 5 MG tablet Take 5 mg by mouth daily with breakfast.   promethazine (PHENERGAN) 25 MG tablet Take 25 mg by mouth every 8 (eight) hours as needed.   sucralfate (CARAFATE) 1 g tablet Take 1 g by mouth 4 (four) times daily -  with meals and at bedtime.   tiZANidine  (ZANAFLEX ) 4 MG tablet Take 4 mg by mouth at bedtime.   topiramate (TOPAMAX) 50 MG tablet Take 50 mg by mouth 2 (two) times daily.   UBRELVY  100 MG TABS Take 100 mg by mouth daily.     Allergies:   Bee venom, Other, Iodinated contrast media, Baclofen , Cyclobenzaprine, Flonase  [fluticasone propionate], Gadopentetate dimeglumine, Guaifenesin , Iron, Lemborexant, Lortab [hydrocodone -acetaminophen ], Seroquel [quetiapine fumarate], Sudafed [pseudoephedrine hcl], Tramadol, Trazodone and nefazodone, Balofloxacin, and Quetiapine fumarate   Social History   Socioeconomic History   Marital status: Divorced    Spouse name: Not on file   Number of children: Not on file   Years of education: Not on file   Highest education level: Not on file  Occupational History   Not on file  Tobacco Use   Smoking status: Some Days    Current packs/day: 0.25    Average packs/day: 0.3 packs/day for 35.0 years (8.8 ttl pk-yrs)    Types: Cigarettes   Smokeless tobacco: Never  Vaping Use   Vaping status: Never Used  Substance and Sexual Activity   Alcohol use: No   Drug use: No   Sexual activity: Yes    Birth control/protection: Surgical  Other Topics Concern   Not on file  Social History Narrative   Not on file   Social Drivers of Health   Financial Resource Strain: Not on file  Food Insecurity: Low Risk  (01/31/2024)   Received from [facility]   Hunger Vital Sign    Within the past 12 months, you worried that your food would run out before you got money to buy more: Never true    Within the past 12 months, the food you bought just didn't last and you didn't have money to get more. : Never true  Transportation Needs: No Transportation Needs (01/31/2024)   Received from [facility]    In the past 12 months, has lack of reliable transportation kept you from medical appointments, meetings, work or from getting things needed for daily living? : No  Physical Activity: Not on file  Stress: Not on file  Social Connections: Not on file     Family History: The patient's family history includes Colon cancer in her maternal  aunt. ROS:   Please see the history of present illness.    All 14 point review of systems negative except as described per history of present illness.  EKGs/Labs/Other Studies Reviewed:    The following studies were reviewed today:   EKG:  EKG Interpretation Date/Time:  Thursday July 02 2024 13:43:31 EDT Ventricular Rate:  72 PR Interval:  154 QRS Duration:  80 QT Interval:  406 QTC Calculation: 444 R Axis:   -16  Text Interpretation: Normal sinus rhythm Normal  ECG When compared with ECG of 18-May-2019 12:04, Vent. rate has decreased BY  63 BPM ST no longer depressed in Inferior leads ST no longer depressed in Anterior leads Confirmed by Bernie Charleston [phone]) on 07/02/2024 2:04:03 PM    Recent Labs: No results found for requested labs within last 365 days.  Recent Lipid Panel No results found for: CHOL, TRIG, HDL, CHOLHDL, VLDL, LDLCALC, LDLDIRECT  Physical Exam:    VS:  BP 90/70   Pulse 72   Ht 5' 8 (1.727 m)   Wt 199 lb (90.3 kg)   SpO2 95%   BMI 30.26 kg/m     Wt Readings from Last 3 Encounters:  07/02/24 199 lb (90.3 kg)  07/14/19 143 lb 12.8 oz (65.2 kg)  05/18/19 164 lb (74.4 kg)     GEN:  Well nourished, well developed in no acute distress HEENT: Normal NECK: No JVD; No carotid bruits LYMPHATICS: No lymphadenopathy CARDIAC: RRR, no murmurs, no rubs, no gallops RESPIRATORY:  Clear to auscultation without rales, wheezing or rhonchi  ABDOMEN: Soft, non-tender, non-distended MUSCULOSKELETAL:  No edema; No deformity  SKIN: Warm and dry NEUROLOGIC:  Alert and oriented x 3 PSYCHIATRIC:  Normal affect   ASSESSMENT:    1. Essential hypertension   2. Chronic diastolic congestive heart failure (HCC)   3. Simple chronic bronchitis (HCC)   4. Shortness of breath   5. Hyperlipidemia, mixed   6. History of CVA (cerebrovascular accident)   7. Rheumatoid arthritis, involving unspecified site, unspecified whether rheumatoid factor present  (HCC)   8. Gastroesophageal reflux disease, unspecified whether esophagitis present    PLAN:    In order of problems listed above:  Coronary disease cardiac catheterization reviewed showed only luminal disease.  She is already on antiplatelet therapy which I think is a good choice, she is also on Lipitor 20 I do not have her cholesterol, therefore, we will do fasting lipid profile today. Chronic diastolic congestive heart failure.  Seems to be compensated on physical exam.  Will check proBNP, will check Chem-7.  Echocardiogram will be done to recheck left ventricle ejection fraction. History of CVA.  I will schedule her to have carotic ultrasounds make sure she does not have any significant carotic arterial disease. Diabetes she tells me that is correct hemoglobin A1c was elevated 9.  I will add fark CIGA 10 mg to her medical regiment, will check fasting lipid profile today, she does not have frequent urinary tract infection so hopefully this medication will be good for her diastolic congestive heart failure as well as for diabetes that look like she is developing.   Medication Adjustments/Labs and Tests Ordered: Current medicines are reviewed at length with the patient today.  Concerns regarding medicines are outlined above.  Orders Placed This Encounter  Procedures   EKG 12-Lead   No orders of the defined types were placed in this encounter.   Signed, [place] DOROTHA Bernie, MD, [facility]. 07/02/2024 2:06 PM    [facility]

## 2024-07-03 LAB — LIPID PANEL
Chol/HDL Ratio: 2.5 ratio (ref 0.0–4.4)
Cholesterol, Total: 139 mg/dL (ref 100–199)
HDL: 56 mg/dL (ref >39)
LDL Chol Calc (NIH): 65 mg/dL (ref 0–99)
Triglycerides: 98 mg/dL (ref 0–149)
VLDL Cholesterol Cal: 18 mg/dL (ref 5–40)

## 2024-07-03 LAB — HEMOGLOBIN A1C
Est. average glucose Bld gHb Est-mCnc: 108 mg/dL
Hgb A1c MFr Bld: 5.4 % (ref 4.8–5.6)

## 2024-07-03 LAB — ALT: ALT: 11 IU/L (ref 0–32)

## 2024-07-03 LAB — AST: AST: 20 IU/L (ref 0–40)

## 2024-07-03 LAB — PRO B NATRIURETIC PEPTIDE: NT-Pro BNP: 60 pg/mL (ref 0–287)

## 2024-07-06 ENCOUNTER — Ambulatory Visit: Admitting: Cardiology

## 2024-07-06 ENCOUNTER — Telehealth: Payer: Self-pay | Admitting: Cardiology

## 2024-07-06 ENCOUNTER — Telehealth: Payer: Self-pay

## 2024-07-06 MED ORDER — FUROSEMIDE 40 MG PO TABS: 40.0000 mg | ORAL_TABLET | Freq: Every day | ORAL | 3 refills | Status: DC

## 2024-07-06 MED ORDER — FUROSEMIDE 40 MG PO TABS: 40.0000 mg | ORAL_TABLET | Freq: Every day | ORAL | 3 refills | Status: AC

## 2024-07-06 MED ORDER — POTASSIUM CHLORIDE CRYS ER 20 MEQ PO TBCR: 40.0000 meq | EXTENDED_RELEASE_TABLET | Freq: Two times a day (BID) | ORAL | 3 refills | Status: DC

## 2024-07-06 MED ORDER — POTASSIUM CHLORIDE CRYS ER 20 MEQ PO TBCR: 40.0000 meq | EXTENDED_RELEASE_TABLET | Freq: Two times a day (BID) | ORAL | 3 refills | Status: AC

## 2024-07-06 NOTE — Telephone Encounter (Signed)
 RX sent in

## 2024-07-06 NOTE — Telephone Encounter (Signed)
 Left message for pt that her labs all looked normal. Advised that we would call with Dr. Karry comments as soon as they are available.

## 2024-07-06 NOTE — Telephone Encounter (Signed)
 Patient returned RN's call regarding results.

## 2024-07-06 NOTE — Telephone Encounter (Signed)
Patient would like to know the results of her lab work, please advise

## 2024-07-06 NOTE — Telephone Encounter (Signed)
*  STAT* If patient is at the pharmacy, call can be transferred to refill team.   1. Which medications need to be refilled? (please list name of each medication and dose if known) furosemide (LASIX) 40 MG tablet and potassium chloride  SA (KLOR-CON  M) 20 MEQ tablet   2. Would you like to learn more about the convenience, safety, & potential cost savings by using the [facility]? NO   3. Are you open to using the [facility] NO   4. Which pharmacy/location (including street and city if local pharmacy) is medication to be sent to?  [facility] - [place], [place] - [address]     5. Do they need a 30 day or 90 day supply? 90

## 2024-07-06 NOTE — Telephone Encounter (Signed)
 Ref Lasix 40mg  daily and Potassium 20mEq Bid to [facility] per pt request

## 2024-07-23 ENCOUNTER — Ambulatory Visit: Payer: Self-pay | Admitting: Cardiology

## 2024-07-27 ENCOUNTER — Telehealth: Payer: Self-pay

## 2024-07-27 ENCOUNTER — Ambulatory Visit: Attending: Cardiology

## 2024-07-27 ENCOUNTER — Ambulatory Visit (INDEPENDENT_AMBULATORY_CARE_PROVIDER_SITE_OTHER)

## 2024-07-27 DIAGNOSIS — R0602 Shortness of breath: Secondary | ICD-10-CM | POA: Diagnosis not present

## 2024-07-27 DIAGNOSIS — Z8673 Personal history of transient ischemic attack (TIA), and cerebral infarction without residual deficits: Secondary | ICD-10-CM | POA: Diagnosis not present

## 2024-07-27 LAB — ECHOCARDIOGRAM COMPLETE
AR max vel: 2.73 cm2
AV Area VTI: 3 cm2
AV Area mean vel: 2.66 cm2
AV Mean grad: 4 mmHg
AV Peak grad: 7.8 mmHg
Ao pk vel: 1.4 m/s
Area-P 1/2: 3.02 cm2
MV VTI: 1.41 cm2
S' Lateral: 3.4 cm

## 2024-07-27 NOTE — Telephone Encounter (Signed)
 Left message on My Chart with lab results per Dr. Karry note. Routed to PCP

## 2024-08-17 ENCOUNTER — Ambulatory Visit (INDEPENDENT_AMBULATORY_CARE_PROVIDER_SITE_OTHER)

## 2024-08-17 VITALS — BP 126/68 | HR 85 | Temp 98.6°F | Ht 67.5 in | Wt 203.0 lb

## 2024-08-17 DIAGNOSIS — J439 Emphysema, unspecified: Secondary | ICD-10-CM

## 2024-08-17 DIAGNOSIS — F17200 Nicotine dependence, unspecified, uncomplicated: Secondary | ICD-10-CM

## 2024-08-17 DIAGNOSIS — F17210 Nicotine dependence, cigarettes, uncomplicated: Secondary | ICD-10-CM

## 2024-08-17 DIAGNOSIS — Z9981 Dependence on supplemental oxygen: Secondary | ICD-10-CM | POA: Diagnosis not present

## 2024-08-17 DIAGNOSIS — Z7952 Long term (current) use of systemic steroids: Secondary | ICD-10-CM

## 2024-08-17 DIAGNOSIS — J441 Chronic obstructive pulmonary disease with (acute) exacerbation: Secondary | ICD-10-CM

## 2024-08-17 DIAGNOSIS — J45909 Unspecified asthma, uncomplicated: Secondary | ICD-10-CM

## 2024-08-17 DIAGNOSIS — J9611 Chronic respiratory failure with hypoxia: Secondary | ICD-10-CM | POA: Diagnosis not present

## 2024-08-17 DIAGNOSIS — M069 Rheumatoid arthritis, unspecified: Secondary | ICD-10-CM

## 2024-08-17 MED ORDER — SODIUM CHLORIDE 3 % IN NEBU: INHALATION_SOLUTION | RESPIRATORY_TRACT | 3 refills | Status: AC | PRN

## 2024-08-17 MED ORDER — AZITHROMYCIN 250 MG PO TABS: 250.0000 mg | ORAL_TABLET | Freq: Every day | ORAL | 0 refills | Status: AC

## 2024-08-17 MED ORDER — ALBUTEROL SULFATE (2.5 MG/3ML) 0.083% IN NEBU: 2.5000 mg | INHALATION_SOLUTION | Freq: Three times a day (TID) | RESPIRATORY_TRACT | Status: AC

## 2024-08-17 MED ORDER — PREDNISONE 10 MG PO TABS: ORAL_TABLET | ORAL | 0 refills | Status: AC

## 2024-08-17 NOTE — Progress Notes (Signed)
 New Patient Pulmonology Office Visit   Subjective:  Patient ID: Alicia Dixon, female    DOB: 1966-03-29  MRN: [id number]  Referred by: Katrinka Aquas, MD  CC:  Chief Complaint  Patient presents with   Consult    Pt states she wants to establish care Pt is on continuous 2L at night and as needed  Prod cough ( phlegm yellow) cough has been around for 3-3 1/2 weeks cough worsens at night  Pt will get flu shot today, if possible   Discussed the use of AI scribe software for clinical note transcription with the patient, who gave verbal consent to proceed.  History of Present Illness Alicia Dixon is a 58 year old female with COPD, asthma, emphysema, and RUL resection due to possible bullae and lung collapse in 2010,  RA on chronic prednisone  5 mg who presents with worsening cough and congestion.  She has had a persistent cough and congestion for three and a half weeks. The cough is severe, worsens at night, and is accompanied by yellowish-green sputum and significant wheezing, particularly at night. Augmentin was prescribed but discontinued due to severe diarrhea.  She uses oxygen  therapy, two liters at night and as needed during the day, with home oxygen  saturation between 90-91%. Oxygen  over the last 7 years. She uses an albuterol  inhaler every three hours and a nebulizer every four hours due to increased mucus production.She is on trelegy 200.  She underwent a partial right lung resection in 2010 or 2011 for recurrent lung collapse. She reported to have several pulmonary blood clots, however I dont see any chronic anticoagulation. She has smoked for over 40 years, currently 8-10 cigarettes daily, reduced from two and a half packs. Smoking cessation aids have been unsuccessful.  She is sensitive to odors, which worsen her respiratory symptoms, and cannot tolerate scented products. She sleeps with three pillows to alleviate wheezing and congestion. She uses oxygen  at night and during  the day as needed, especially with exertion or in hot weather.   ROS as above  Allergies: Bee venom, Other, Iodinated contrast media, Baclofen , Cyclobenzaprine, Flonase  [fluticasone propionate], Gadopentetate dimeglumine, Guaifenesin , Iron, Lemborexant, Lortab [hydrocodone -acetaminophen ], Seroquel [quetiapine fumarate], Sudafed [pseudoephedrine hcl], Tramadol, Trazodone and nefazodone, Balofloxacin, and Quetiapine fumarate  Current Outpatient Medications:    ALPRAZolam  (XANAX ) 1 MG tablet, Take 1 mg by mouth 4 (four) times daily as needed for anxiety. , Disp: , Rfl: 3   aspirin  EC 81 MG tablet, Take 81 mg by mouth daily., Disp: , Rfl:    atorvastatin  (LIPITOR) 20 MG tablet, Take 20 mg by mouth at bedtime., Disp: , Rfl: 1   cetirizine (ZYRTEC) 10 MG tablet, Take 10 mg by mouth daily., Disp: , Rfl:    dapagliflozin  propanediol (FARXIGA ) 10 MG TABS tablet, Take 1 tablet (10 mg total) by mouth daily before breakfast., Disp: 90 tablet, Rfl: 3   dapagliflozin  propanediol (FARXIGA ) 10 MG TABS tablet, Take 1 tablet (10 mg total) by mouth daily before breakfast., Disp: , Rfl:    diclofenac Sodium (VOLTAREN) 1 % GEL, Apply 2 g topically 4 (four) times daily., Disp: , Rfl:    EPINEPHrine 0.3 mg/0.3 mL IJ SOAJ injection, Inject 0.3 mg into the muscle as needed for anaphylaxis., Disp: , Rfl:    Fluticasone-Umeclidin-Vilant (TRELEGY ELLIPTA) 200-62.5-25 MCG/ACT AEPB, Inhale 1 puff into the lungs 2 (two) times daily., Disp: , Rfl:    furosemide (LASIX) 40 MG tablet, Take 1 tablet (40 mg total) by mouth daily.,  Disp: 90 tablet, Rfl: 3   gabapentin  (NEURONTIN ) 600 MG tablet, Take 600 mg by mouth 4 (four) times daily., Disp: , Rfl:    INGREZZA 80 MG capsule, Take 80 mg by mouth daily., Disp: , Rfl:    ipratropium-albuterol  (DUONEB) 0.5-2.5 (3) MG/3ML SOLN, Take 3 mLs by nebulization 4 (four) times daily., Disp: 360 mL, Rfl: 12   lamoTRIgine  (LAMICTAL ) 150 MG tablet, Take 300 mg by mouth at bedtime., Disp: , Rfl:  3   mirtazapine  (REMERON ) 45 MG tablet, Take 45 mg by mouth at bedtime., Disp: , Rfl: 3   montelukast  (SINGULAIR ) 10 MG tablet, Take 10 mg by mouth every evening., Disp: , Rfl: 2   omeprazole (PRILOSEC) 20 MG capsule, Take 20 mg by mouth 2 (two) times daily., Disp: , Rfl:    potassium chloride  SA (KLOR-CON  M) 20 MEQ tablet, Take 2 tablets (40 mEq total) by mouth 2 (two) times daily., Disp: 360 tablet, Rfl: 3   predniSONE  (DELTASONE ) 5 MG tablet, Take 5 mg by mouth daily with breakfast., Disp: , Rfl:    promethazine (PHENERGAN) 25 MG tablet, Take 25 mg by mouth every 8 (eight) hours as needed., Disp: , Rfl:    sucralfate (CARAFATE) 1 g tablet, Take 1 g by mouth 4 (four) times daily -  with meals and at bedtime., Disp: , Rfl:    tiZANidine  (ZANAFLEX ) 4 MG tablet, Take 4 mg by mouth at bedtime., Disp: , Rfl:    topiramate (TOPAMAX) 50 MG tablet, Take 50 mg by mouth 2 (two) times daily., Disp: , Rfl:    UBRELVY 100 MG TABS, Take 100 mg by mouth daily., Disp: , Rfl:  Past Medical History:  Diagnosis Date   Asthma    Bipolar disorder (HCC)    COPD (chronic obstructive pulmonary disease) (HCC)    Depression    Stroke (HCC)    Past Surgical History:  Procedure Laterality Date   ABDOMINAL HYSTERECTOMY     APPENDECTOMY     CESAREAN SECTION     CHOLECYSTECTOMY     LUNG LOBECTOMY     right lung    Family History  Problem Relation Age of Onset   Colon cancer Maternal Aunt    Social History   Socioeconomic History   Marital status: Divorced    Spouse name: Not on file   Number of children: Not on file   Years of education: Not on file   Highest education level: Not on file  Occupational History   Not on file  Tobacco Use   Smoking status: Some Days    Current packs/day: 0.25    Average packs/day: 0.3 packs/day for 35.0 years (8.8 ttl pk-yrs)    Types: Cigarettes   Smokeless tobacco: Never  Vaping Use   Vaping status: Never Used  Substance and Sexual Activity   Alcohol use: No    Drug use: No   Sexual activity: Yes    Birth control/protection: Surgical  Other Topics Concern   Not on file  Social History Narrative   Not on file   Social Drivers of Health   Financial Resource Strain: Not on file  Food Insecurity: Low Risk  (01/31/2024)   Received from [facility]   Hunger Vital Sign    Within the past 12 months, you worried that your food would run out before you got money to buy more: Never true    Within the past 12 months, the food you bought just didn't last and you didn't  have money to get more. : Never true  Transportation Needs: No Transportation Needs (01/31/2024)   Received from [facility]    In the past 12 months, has lack of reliable transportation kept you from medical appointments, meetings, work or from getting things needed for daily living? : No  Physical Activity: Not on file  Stress: Not on file  Social Connections: Not on file  Intimate Partner Violence: Not on file       Objective:  There were no vitals taken for this visit. Wt Readings from Last 3 Encounters:  08/17/24 203 lb (92.1 kg)  07/02/24 199 lb (90.3 kg)  07/14/19 143 lb 12.8 oz (65.2 kg)   BMI Readings from Last 3 Encounters:  08/17/24 31.33 kg/m  07/02/24 30.26 kg/m  07/14/19 21.86 kg/m   SpO2 Readings from Last 3 Encounters:  08/17/24 91%  07/02/24 95%  07/14/19 98%    Physical Exam Constitutional:      Appearance: Normal appearance.  HENT:     Head: Normocephalic and atraumatic.  Eyes:     Extraocular Movements: Extraocular movements intact.     Pupils: Pupils are equal, round, and reactive to light.  Cardiovascular:     Rate and Rhythm: Normal rate and regular rhythm.  Pulmonary:     Effort: Pulmonary effort is normal.     Breath sounds: Normal breath sounds.     Comments: No wheezing Musculoskeletal:        General: Normal range of motion.     Comments: No edema  Neurological:     General: No focal deficit present.      Mental Status: She is alert and oriented to person, place, and time.   [facility] [place] [facility] - [facility] - pulmonary  [phone] [id number] Dr. Darrold   Assessment & Plan:   Assessment & Plan COPD with acute exacerbation and chronic respiratory failure with hypoxia (on home oxygen ) Emphysema Asthma/copd overlap syndrome Current smoker Acute COPD exacerbation with increased mucus and wheezing over the last 3 weeks. Oxygen  saturation 90-91% on home oxygen .   -Continue Trelegy 200 - Prescribed azithromycin  for 5 days. - Prescribed prednisone  taper: 40 mg for 3 days, 30 mg for 3 days, 20 mg for 1 day, 10 mg for 1 day, then return to 5 mg daily. - Continue albuterol  inhaler and nebulizer use. - Add hypertonic saline to nebulizer regimen, use twice daily for two weeks. - Provided flutter valve for mucus clearance, use every two hours or with nebulizer. - Requested pulmonary records from [facility].  Rheumatoid arthritis Chronic rheumatoid arthritis managed with prednisone . Reports weight gain from long-term prednisone  use. - Adjust prednisone  regimen as per COPD exacerbation plan.  Tobacco dependence (current smoker) Long-standing tobacco dependence with current smoking of 8-10 cigarettes per day. Previous cessation attempts unsuccessful. - Encouraged continued reduction in smoking. -At the next visit I will enroll to the lung cancer screening and offer smoking cessation clinic.   Marny Patch, MD Pulmonary and Critical Care Medicine [facility]

## 2024-08-17 NOTE — Patient Instructions (Addendum)
 Dear Ms Shlomo,   Your symptoms are concerning for COPD flare, I will recommend the following:  -Continue Trelegy as prescribed -Albuterol  and hypertonic saline two times a day for 2 weeks and then as need it. -Prednisone  taper:  40 mg for 3 days  30 mg for 3 days  20 mg for 1 day  10 mg for 1 day  Then go back to your regular prednisone  dose 5 mg. -Azithromycin , 1 pill daily for 5 days -Use the flutter valve, every 2 hours as need it for congestion  Acapella device instructions: How to Use the Acapella or Flutter valve Assure proper setting of the dial on the end of the Acapella. This is the end opposite the mouthpiece. Your health care provider will set the dial when you get your Acapella. Rotate the end toward the + (plus) to increase resistance. Rotate the end toward -  (minus) to decrease resistance. Sit up with good posture to use the Acapella. Sometimes we may have you lie in a postural drainage position to use the Acapella. Postural drainage positions include lying flat on your back, flat on your side and alternating sides. Your health care provider will show you postural drainage positions to use if these are recommended. Take in a fairly deep breath and hold it for about 3 seconds. Place the Acapella mouthpiece in your mouth. Seal your lips tightly around the mouthpiece. Exhale as much as possible (but not to forcefully) through the mouthpiece. Keep your cheeks as firm as possible when you exhale. Try not to inhale through the device. Repeat this maneuver for 10 breaths. Try to resist coughing during this phase. After these 10 blows, perform 3 huffs, then a big cough to bring the sputum up and out. Try not to swallow the mucus. Huff coughing is a type of coughing if you have trouble clearing your mucus. Take a breath that is slightly deeper than normal. Use your stomach muscles to make a series of 3 rapid exhalations with the airway open, making a "ha, ha, ha" sound. Follow this by  normal breathing and a deep cough if you feel mucus moving. Repeat steps 1 through 7 for 15 minutes 2 to 4 times a day (or as prescribed by your health care provider). Your health care provider will tell you how many times a day to use the Acapella. If you use a short-acting inhaled bronchodilator, use the Acapella 15 minutes after you inhale the medicine.   How to Clean and Care for the Acapella (green or blue) Wash your hands. Clean the Acapella every day. Take the mouthpiece off the Acapella body. The mouthpiece and body need to be washed.   Daily - Clean the mouthpiece and body of the Acapella daily with liquid dish detergent - Rinse with water. Disinfect Weekly - Clean the mouthpiece and body with alcohol - soak both parts in 70 percent isopropyl alcohol for 5 minutes. OR Clean mouthpiece and body with hydrogen peroxide - soak both parts in 3 percent hydrogen peroxide for 30 minutes.   Note: The Acapella should not be placed in the automatic dishwasher, boiled or bleached. Rinse in clean water. Shake off excess water. Drain dry the device. Place each piece downward or rest the unit on its side. Replace the mouthpiece when the unit is completely dry and ready for use.

## 2024-08-17 NOTE — Assessment & Plan Note (Deleted)
  Orders:   AMB REFERRAL FOR DME   albuterol  (PROVENTIL ) (2.5 MG/3ML) 0.083% nebulizer solution 2.5 mg

## 2024-08-18 ENCOUNTER — Telehealth: Payer: Self-pay

## 2024-08-18 ENCOUNTER — Other Ambulatory Visit: Payer: Self-pay

## 2024-08-18 NOTE — Telephone Encounter (Signed)
 Copied from CRM [id number]. Topic: Clinical - Prescription Issue >> Aug 17, 2024  3:21 PM Rozanna MATSU wrote: Reason for CRM: [place] [id number] with [facility] has questions about the patients prescription for Sodium Chloride . Stated needs clarity on this one.   Spoke with [facility] updated Rx

## 2024-08-26 NOTE — Telephone Encounter (Signed)
 Patient called and made aware of results. Denies any questions/concerns.  Alan, RN

## 2024-09-21 ENCOUNTER — Ambulatory Visit

## 2024-09-25 ENCOUNTER — Encounter: Payer: Self-pay | Admitting: *Deleted

## 2024-09-29 ENCOUNTER — Ambulatory Visit: Admitting: Cardiology

## 2024-10-19 ENCOUNTER — Ambulatory Visit

## 2024-12-11 ENCOUNTER — Ambulatory Visit: Admitting: Cardiology

## 2025-01-05 ENCOUNTER — Ambulatory Visit
